# Patient Record
Sex: Female | Born: 1980 | Race: Black or African American | Hispanic: No | Marital: Married | Smoking: Never smoker
Health system: Southern US, Community
[De-identification: ages and names within clinical notes are randomized; demographics above are authoritative.]

## PROBLEM LIST (undated history)

## (undated) ENCOUNTER — Inpatient Hospital Stay (HOSPITAL_COMMUNITY): Payer: Self-pay

## (undated) DIAGNOSIS — Z87440 Personal history of urinary (tract) infections: Secondary | ICD-10-CM

## (undated) DIAGNOSIS — Z8619 Personal history of other infectious and parasitic diseases: Secondary | ICD-10-CM

## (undated) DIAGNOSIS — B373 Candidiasis of vulva and vagina: Secondary | ICD-10-CM

## (undated) DIAGNOSIS — Z87898 Personal history of other specified conditions: Secondary | ICD-10-CM

## (undated) DIAGNOSIS — O402XX0 Polyhydramnios, second trimester, not applicable or unspecified: Secondary | ICD-10-CM

## (undated) DIAGNOSIS — F329 Major depressive disorder, single episode, unspecified: Secondary | ICD-10-CM

## (undated) DIAGNOSIS — E039 Hypothyroidism, unspecified: Secondary | ICD-10-CM

## (undated) DIAGNOSIS — D509 Iron deficiency anemia, unspecified: Secondary | ICD-10-CM

## (undated) DIAGNOSIS — G43909 Migraine, unspecified, not intractable, without status migrainosus: Secondary | ICD-10-CM

## (undated) DIAGNOSIS — B379 Candidiasis, unspecified: Secondary | ICD-10-CM

## (undated) DIAGNOSIS — IMO0002 Reserved for concepts with insufficient information to code with codable children: Secondary | ICD-10-CM

## (undated) HISTORY — DX: Personal history of other specified conditions: Z87.898

## (undated) HISTORY — DX: Major depressive disorder, single episode, unspecified: F32.9

## (undated) HISTORY — DX: Candidiasis of vulva and vagina: B37.3

## (undated) HISTORY — PX: WISDOM TOOTH EXTRACTION: SHX21

## (undated) HISTORY — DX: Personal history of urinary (tract) infections: Z87.440

## (undated) HISTORY — DX: Personal history of other infectious and parasitic diseases: Z86.19

## (undated) HISTORY — DX: Polyhydramnios, second trimester, not applicable or unspecified: O40.2XX0

## (undated) HISTORY — DX: Reserved for concepts with insufficient information to code with codable children: IMO0002

## (undated) HISTORY — DX: Candidiasis, unspecified: B37.9

---
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
## 2000-04-17 DIAGNOSIS — IMO0002 Reserved for concepts with insufficient information to code with codable children: Secondary | ICD-10-CM

## 2000-04-17 DIAGNOSIS — R87619 Unspecified abnormal cytological findings in specimens from cervix uteri: Secondary | ICD-10-CM

## 2000-04-17 HISTORY — DX: Reserved for concepts with insufficient information to code with codable children: IMO0002

## 2000-04-17 HISTORY — DX: Unspecified abnormal cytological findings in specimens from cervix uteri: R87.619

## 2001-04-24 ENCOUNTER — Other Ambulatory Visit: Admission: RE | Admit: 2001-04-24 | Discharge: 2001-04-24 | Payer: Self-pay | Admitting: Obstetrics & Gynecology

## 2001-12-24 ENCOUNTER — Other Ambulatory Visit: Admission: RE | Admit: 2001-12-24 | Discharge: 2001-12-24 | Payer: Self-pay | Admitting: Obstetrics and Gynecology

## 2002-05-14 ENCOUNTER — Other Ambulatory Visit: Admission: RE | Admit: 2002-05-14 | Discharge: 2002-05-14 | Payer: Self-pay | Admitting: Obstetrics and Gynecology

## 2002-07-22 ENCOUNTER — Encounter: Payer: Self-pay | Admitting: Emergency Medicine

## 2002-07-22 ENCOUNTER — Emergency Department (HOSPITAL_COMMUNITY): Admission: EM | Admit: 2002-07-22 | Discharge: 2002-07-22 | Payer: Self-pay | Admitting: Emergency Medicine

## 2002-09-22 DIAGNOSIS — Z87898 Personal history of other specified conditions: Secondary | ICD-10-CM

## 2002-09-22 HISTORY — DX: Personal history of other specified conditions: Z87.898

## 2003-02-04 ENCOUNTER — Emergency Department (HOSPITAL_COMMUNITY): Admission: EM | Admit: 2003-02-04 | Discharge: 2003-02-04 | Payer: Self-pay | Admitting: Emergency Medicine

## 2003-05-28 ENCOUNTER — Other Ambulatory Visit: Admission: RE | Admit: 2003-05-28 | Discharge: 2003-05-28 | Payer: Self-pay | Admitting: Obstetrics and Gynecology

## 2003-06-12 DIAGNOSIS — B373 Candidiasis of vulva and vagina: Secondary | ICD-10-CM

## 2003-06-12 DIAGNOSIS — B3731 Acute candidiasis of vulva and vagina: Secondary | ICD-10-CM

## 2003-06-12 HISTORY — DX: Candidiasis of vulva and vagina: B37.3

## 2003-06-12 HISTORY — DX: Acute candidiasis of vulva and vagina: B37.31

## 2003-07-14 ENCOUNTER — Ambulatory Visit (HOSPITAL_COMMUNITY): Admission: RE | Admit: 2003-07-14 | Discharge: 2003-07-14 | Payer: Self-pay | Admitting: Family Medicine

## 2004-02-28 ENCOUNTER — Emergency Department (HOSPITAL_COMMUNITY): Admission: EM | Admit: 2004-02-28 | Discharge: 2004-02-28 | Payer: Self-pay | Admitting: Emergency Medicine

## 2004-12-15 ENCOUNTER — Ambulatory Visit (HOSPITAL_COMMUNITY): Admission: RE | Admit: 2004-12-15 | Discharge: 2004-12-15 | Payer: Self-pay | Admitting: Obstetrics and Gynecology

## 2005-02-06 ENCOUNTER — Ambulatory Visit (HOSPITAL_COMMUNITY): Admission: RE | Admit: 2005-02-06 | Discharge: 2005-02-06 | Payer: Self-pay | Admitting: Obstetrics and Gynecology

## 2005-04-07 ENCOUNTER — Inpatient Hospital Stay (HOSPITAL_COMMUNITY): Admission: AD | Admit: 2005-04-07 | Discharge: 2005-04-07 | Payer: Self-pay | Admitting: Obstetrics and Gynecology

## 2005-04-23 ENCOUNTER — Inpatient Hospital Stay (HOSPITAL_COMMUNITY): Admission: AD | Admit: 2005-04-23 | Discharge: 2005-04-26 | Payer: Self-pay | Admitting: Obstetrics and Gynecology

## 2005-04-27 ENCOUNTER — Encounter: Admission: RE | Admit: 2005-04-27 | Discharge: 2005-05-27 | Payer: Self-pay | Admitting: Obstetrics and Gynecology

## 2005-05-28 ENCOUNTER — Encounter: Admission: RE | Admit: 2005-05-28 | Discharge: 2005-06-07 | Payer: Self-pay | Admitting: Obstetrics and Gynecology

## 2005-09-08 ENCOUNTER — Other Ambulatory Visit: Admission: RE | Admit: 2005-09-08 | Discharge: 2005-09-08 | Payer: Self-pay | Admitting: Obstetrics and Gynecology

## 2006-04-17 DIAGNOSIS — F32A Depression, unspecified: Secondary | ICD-10-CM

## 2006-04-17 HISTORY — DX: Depression, unspecified: F32.A

## 2007-09-10 ENCOUNTER — Emergency Department (HOSPITAL_COMMUNITY): Admission: EM | Admit: 2007-09-10 | Discharge: 2007-09-10 | Payer: Self-pay | Admitting: Family Medicine

## 2009-04-17 DIAGNOSIS — Z87440 Personal history of urinary (tract) infections: Secondary | ICD-10-CM

## 2009-04-17 HISTORY — DX: Personal history of urinary (tract) infections: Z87.440

## 2010-09-02 NOTE — H&P (Signed)
Danielle Maxwell              ACCOUNT NO.:  [id number]   MEDICAL RECORD NO.:  [id number]          PATIENT TYPE:  INP   LOCATION:  9160                          FACILITY:  [facility]   PHYSICIAN:  Danielle Maxwell, M.D. DATE OF BIRTH:  [birth year]   DATE OF ADMISSION:  04/23/2005  DATE OF DISCHARGE:                                HISTORY & PHYSICAL   [name] is a 30 year old gravida 2, para 0-0-1-0, who presents at [redacted]  weeks gestation, EDD April 30, 2005, as determined by ultrasound at 6  weeks 4 days.  She began having contractions through the night that have  been increasing in frequency and intensity.  She reports positive fetal  movement, no vaginal bleeding, no rupture of membranes.  Her contractions  are now every 3-5 minutes and she is noted to be 5 cm dilated.  She denies  any headache, visual changes, or epigastric pain.  Her pregnancy has been  followed by the C.N.M. service at [facility] and is remarkable for:  (1) Transfer  of care from [facility] at 18 weeks.  (2) ? LMP.  (3) First trimester  bleeding.  (4) Father of the baby with congenital heart defect.  (5) Group B  Strep positive.  This patient began prenatal care early in the first  trimester at [facility] and transferred care to [facility] at 18 weeks.  Her  prenatal course has been essentially unremarkable.  Ultrasound examination  of the fetus initially showed EIF and renal pyelectasis.  Follow-up  ultrasound studies were benign.  Growth within normal limits.  Fluid within  normal limits.  The patient has been normotensive throughout her pregnancy  with no proteinuria.  She had some right-sided abdominal pain at  approximately [redacted] weeks gestation.  Evaluation was found to be within normal  limits.  Comprehensive metabolic panel, amylase, and lipase were checked and  these were all within normal limits.  Pain did dissipate and did not return.  At 27 weeks, the patient was checked for preterm labor.  Her fetal  fibronectin at that time was negative, cervix long and closed, and she did  not have any further complications.   PRENATAL LABORATORY DATA:  On October 21, 2004; hemoglobin and hematocrit 13.4  and 39.4, platelets 264,000.  Blood type and Rh O positive, antibody screen  negative, sickle cell trait negative, VDRL nonreactive, rubella immune,  hepatitis B surface antigen negative, HIV nonreactive, Pap smear within  normal limits, GC and Chlamydia negative.  At 28 weeks, 1-hour Glucose  Challenge 97 and at 36 weeks culture of the vaginal tract is positive for  Group B Strep.   PAST OBSTETRICAL HISTORY:  In 2002 the patient had a first trimester  elective abortion with no complications.  This is her second and current  pregnancy.   PAST MEDICAL HISTORY:  Unremarkable.   ALLERGIES:  No known drug allergies.   SOCIAL HISTORY:  She denies the use of tobacco, alcohol, or illicit drugs.  She began her pregnancy at 114 pounds, last prenatal weight was 145 pounds  for a 31 pound weight gain.  FAMILY HISTORY:  Maternal grandmother and maternal grandfather with a  history of chronic hypertension on medications.  Paternal grandmother has  varicose veins.  The patient's father, maternal grandfather, and paternal  grandmother with a history of diabetes.  Maternal grandfather prostate  cancer.  Paternal grandmother breast cancer.  Maternal aunt had drug  addiction.   GENETIC HISTORY:  Father of the baby had heart valve replacement shortly  after birth.   SOCIAL HISTORY:  [name] is a married 30 year old African-American  female.  Her husband, Danielle Maxwell, is involved and supportive.  They  are [place] in their faith.  The patient's husband works as a Naval architect  and she works as a Diplomatic Services operational officer.   REVIEW OF SYSTEMS:  As described above.  The patient is typical of one with  a uterine pregnancy at term in early active labor.   PHYSICAL EXAMINATION:  VITAL SIGNS:  Stable.  The  patient is afebrile.  HEENT:  Unremarkable.  HEART:  Regular rate and rhythm.  LUNGS:  Clear.  ABDOMEN:  Gravid in its contour.  Uterine fundus is noted to extend 40 cm  above the level of the pubic symphysis.  Leopold's maneuver finds the infant  to be in the longitudinal lie, cephalic presentation, and the estimated  fetal weight is 8 pounds.  The baseline of the fetal heart rate monitor is  140s with average longterm variability reactivity is present with no  periodic changes.  The patient is contracting every 3-5 minutes.  PELVIC:  Digital examination of the cervix finds it to be 5 cm dilated, 90%  effaced, with cephalic presenting part at a -1 station and membranes intact.  EXTREMITIES:  No pathologic edema.  DTRs are 1+ with no clonus.  There is  negative calf tenderness bilaterally.   ASSESSMENT:  1.  Intrauterine pregnancy at 39 weeks.  2.  Early active labor.   PLAN:  Admit per Danielle Maxwell, M.D.  Routine C.N.M. orders.  Start  penicillin-G prophylaxis for positive Group B Strep.  May have epidural  p.r.n.      Danielle Maxwell, C.N.M.      Danielle Maxwell, M.D.  Electronically Signed    SDM/MEDQ  D:  04/23/2005  T:  04/24/2005  Job:  [id number]

## 2011-01-11 LAB — POCT PREGNANCY, URINE
Operator id: 282151
Preg Test, Ur: NEGATIVE

## 2011-01-11 LAB — POCT URINALYSIS DIP (DEVICE)
Glucose, UA: NEGATIVE
Nitrite: NEGATIVE
Operator id: 282151
Protein, ur: 100 — AB
Specific Gravity, Urine: 1.02
Urobilinogen, UA: 0.2
pH: 6.5

## 2011-01-11 LAB — URINE CULTURE
Colony Count: NO GROWTH
Culture: NO GROWTH

## 2011-04-18 NOTE — L&D Delivery Note (Signed)
Delivery Note Pushing started at 2158 with pt feeling urge.  Pushed well to SVD at 10:27 PM a viable female "Danielle Maxwell" was delivered via Vaginal, Spontaneous Delivery (Presentation: Right Occiput Anterior).  APGAR: 9, 9; weight: not done at time of this note.  Slight delay of shoulders, and body, so HOB down and McRoberts, but pt w/ significantly more pain as head crowning and had difficulty relaxing pelvis and legs and started creeping up bed, so her position hindered ease of delivery as well. Placenta status: Intact, Spontaneous, Schultz.  Cord: 3 vessels with the following complications: None.  Cord pH: not collected  Anesthesia: Epidural Local: Lidocaine 1% Episiotomy: None Lacerations: shallow/partial 2nd degree Suture Repair: 3-0 monocryl Est. Blood Loss (mL): 250  Mom to postpartum.  Baby to nursery-stable. Plans to breastfeed.    [name] H 12/31/2011, 11:28 PM

## 2011-06-15 ENCOUNTER — Encounter (INDEPENDENT_AMBULATORY_CARE_PROVIDER_SITE_OTHER): Payer: BC Managed Care – PPO

## 2011-06-15 DIAGNOSIS — Z331 Pregnant state, incidental: Secondary | ICD-10-CM

## 2011-06-15 LAB — OB RESULTS CONSOLE ABO/RH: RH Type: NEGATIVE

## 2011-06-15 LAB — OB RESULTS CONSOLE HGB/HCT, BLOOD
HCT: 15 %
Hemoglobin: 13.6 g/dL

## 2011-06-15 LAB — OB RESULTS CONSOLE HEPATITIS B SURFACE ANTIGEN: Hepatitis B Surface Ag: NEGATIVE

## 2011-06-15 LAB — OB RESULTS CONSOLE RPR: RPR: NONREACTIVE

## 2011-06-15 LAB — OB RESULTS CONSOLE RUBELLA ANTIBODY, IGM: Rubella: IMMUNE

## 2011-06-15 LAB — OB RESULTS CONSOLE HIV ANTIBODY (ROUTINE TESTING): HIV: NONREACTIVE

## 2011-06-15 LAB — OB RESULTS CONSOLE PLATELET COUNT: Platelets: 296 10*3/uL

## 2011-06-27 ENCOUNTER — Encounter (INDEPENDENT_AMBULATORY_CARE_PROVIDER_SITE_OTHER): Payer: BC Managed Care – PPO | Admitting: Registered Nurse

## 2011-06-27 DIAGNOSIS — Z331 Pregnant state, incidental: Secondary | ICD-10-CM

## 2011-06-27 LAB — OB RESULTS CONSOLE GC/CHLAMYDIA
Chlamydia: NEGATIVE
Chlamydia: NEGATIVE
Gonorrhea: NEGATIVE
Gonorrhea: NEGATIVE

## 2011-07-19 DIAGNOSIS — A491 Streptococcal infection, unspecified site: Secondary | ICD-10-CM | POA: Insufficient documentation

## 2011-08-01 ENCOUNTER — Encounter: Payer: Self-pay | Admitting: Obstetrics and Gynecology

## 2011-08-01 ENCOUNTER — Encounter (HOSPITAL_COMMUNITY): Payer: Self-pay

## 2011-08-01 ENCOUNTER — Inpatient Hospital Stay (HOSPITAL_COMMUNITY)
Admission: AD | Admit: 2011-08-01 | Discharge: 2011-08-01 | Disposition: A | Discharge status: Home / Self Care | Payer: BC Managed Care – PPO | Source: Ambulatory Visit | Attending: Obstetrics and Gynecology | Admitting: Obstetrics and Gynecology

## 2011-08-01 ENCOUNTER — Ambulatory Visit (INDEPENDENT_AMBULATORY_CARE_PROVIDER_SITE_OTHER): Payer: BC Managed Care – PPO | Admitting: Obstetrics and Gynecology

## 2011-08-01 VITALS — BP 104/60 | Ht 63.0 in | Wt 128.0 lb

## 2011-08-01 DIAGNOSIS — Z331 Pregnant state, incidental: Secondary | ICD-10-CM

## 2011-08-01 DIAGNOSIS — R002 Palpitations: Secondary | ICD-10-CM | POA: Insufficient documentation

## 2011-08-01 DIAGNOSIS — R0602 Shortness of breath: Secondary | ICD-10-CM | POA: Insufficient documentation

## 2011-08-01 DIAGNOSIS — O99891 Other specified diseases and conditions complicating pregnancy: Secondary | ICD-10-CM | POA: Insufficient documentation

## 2011-08-01 NOTE — Progress Notes (Signed)
Pt c/o of SOB while talking and walking.  She does not exercise.  No CP.  occ heart palpitations CV; RRR Lungs: CTAB ABD; soft , gravid NT EXT no CCEB.  Neg Homans sign B SOB with pregnancy.  To MAU for pulse ox and eval.  May need cardiology consultation  Quad screen today per pt request Anatomy US @[facility]  per pt request

## 2011-08-01 NOTE — Progress Notes (Signed)
  History   S: short of breath x 3 just walking or talking at work to the point of needing to sit down, felt heart fluttering a few times while resting in bed. Not sure maybe feeling baby move. Drinks 3 cups coffee weekly. No cardiac problems in family  CSN: [id number]  Arrival date and time: 08/01/11 1110   First Provider Initiated Contact with Patient 08/01/11 1140      Chief Complaint  Patient presents with  . Shortness of Breath   HPI  OB History    Grav Para Term Preterm Abortions TAB SAB Ect Mult Living   3 1 1  1  0    1      Past Medical History  Diagnosis Date  . Monilial vaginitis 06/12/2003  . H/O urinary frequency 09/22/2002  . H/O chlamydia infection   . History of varicella   . H/O rubella   . Yeast infection   . Abnormal Pap smear 2002  . Depression 2008  . Hx: UTI (urinary tract infection) 04/2009    Past Surgical History  Procedure Date  . Wisdom tooth extraction     Family History  Problem Relation Age of Onset  . Diabetes Father   . Hypertension Maternal Aunt   . Alcohol abuse Paternal Aunt   . Hypertension Maternal Grandmother   . Hypertension Maternal Grandfather   . Diabetes Maternal Grandfather   . Cancer Maternal Grandfather   . Diabetes Paternal Grandmother   . Anesthesia problems Neg Hx     History  Substance Use Topics  . Smoking status: Never Smoker   . Smokeless tobacco: Not on file  . Alcohol Use: No    Allergies: No Known Allergies  Prescriptions prior to admission  Medication Sig Dispense Refill  . Prenatal Vit-Fe Fumarate-FA (PRENATAL MULTIVITAMIN) TABS Take 1 tablet by mouth daily.        ROS Physical Exam  Calm, no distress, lungs clear bilaterally, AP RRR, FHTS + O2 sat 100% Blood pressure 109/64, pulse 87, temperature 97 F (36.1 C), temperature source Oral, resp. rate 16, last menstrual period 03/20/2011, SpO2 100.00%.  Physical Exam  MAU Course  Procedures   Assessment and Plan  17 5/7 week  pregnancy Short of breath and palpitations Addendum: Plan cardiac referral, discussed 8 water daily, meals and snacks, change position slowly, sit or lay down if not feeling well, report if sx change or worsen, office will schedule cardiac referral, collaboration with Dr. Stefano Gaul.  Benedict Kue 08/01/2011, 11:49 AM

## 2011-08-01 NOTE — Discharge Instructions (Signed)
abcShortness of Breath   Your condition does not improve in the time expected.   You have a hard time doing your normal activities even with rest.   You have any side effects or problems with the medicines prescribed.   You develop any new symptoms.  SEEK IMMEDIATE MEDICAL CARE IF:   Your shortness of breath is getting worse.   You feel lightheaded, faint, or develop a cough not controlled with medicines.   You start coughing up blood.   You have pain with breathing.   You have chest pain or pain in your arms, shoulders, or abdomen.   You have a fever.   You are unable to walk up stairs or exercise the way you normally do.   Your symptoms are getting worse.  Document Released: 12/27/2000 Document Revised: 03/23/2011 Document Reviewed: 08/14/2007 [facility] Patient Information 2012 [place], [place].ABCs of Pregnancy A Antepartum care is very important. Be sure you see your doctor and get prenatal care as soon as you think you are pregnant. At this time, you will be tested for infection, genetic abnormalities and potential problems with you and the pregnancy. This is the time to discuss diet, exercise, work, medications, labor, pain medication during labor and the possibility of a cesarean delivery. Ask any questions that may concern you. It is important to see your doctor regularly throughout your pregnancy. Avoid exposure to toxic substances and chemicals - such as cleaning solvents, lead and mercury, some insecticides, and paint. Pregnant women should avoid exposure to paint fumes, and fumes that cause you to feel ill, dizzy or faint. When possible, it is a good idea to have a pre-pregnancy consultation with your caregiver to begin some important recommendations your caregiver suggests such as, taking folic acid, exercising, quitting smoking, avoiding alcoholic beverages, etc. B Breastfeeding is the healthiest choice for both you and your baby. It has many nutritional benefits for the  baby and health benefits for the mother. It also creates a very tight and loving bond between the baby and mother. Talk to your doctor, your family and friends, and your employer about how you choose to feed your baby and how they can support you in your decision. Not all birth defects can be prevented, but a woman can take actions that may increase her chance of having a healthy baby. Many birth defects happen very early in pregnancy, sometimes before a woman even knows she is pregnant. Birth defects or abnormalities of any child in your or the father's family should be discussed with your caregiver. Get a good support bra as your breast size changes. Wear it especially when you exercise and when nursing.  C Celebrate the news of your pregnancy with the your spouse/father and family. Childbirth classes are helpful to take for you and the spouse/father because it helps to understand what happens during the pregnancy, labor and delivery. Cesarean delivery should be discussed with your doctor so you are prepared for that possibility. The pros and cons of circumcision if it is a boy, should be discussed with your pediatrician. Cigarette smoking during pregnancy can result in low birth weight babies. It has been associated with infertility, miscarriages, tubal pregnancies, infant death (mortality) and poor health (morbidity) in childhood. Additionally, cigarette smoking may cause long-term learning disabilities. If you smoke, you should try to quit before getting pregnant and not smoke during the pregnancy. Secondary smoke may also harm a mother and her developing baby. It is a good idea to ask people to stop  smoking around you during your pregnancy and after the baby is born. Extra calcium is necessary when you are pregnant and is found in your prenatal vitamin, in dairy products, green leafy vegetables and in calcium supplements. D A healthy diet according to your current weight and height, along with vitamins and  mineral supplements should be discussed with your caregiver. Domestic abuse or violence should be made known to your doctor right away to get the situation corrected. Drink more water when you exercise to keep hydrated. Discomfort of your back and legs usually develops and progresses from the middle of the second trimester through to delivery of the baby. This is because of the enlarging baby and uterus, which may also affect your balance. Do not take illegal drugs. Illegal drugs can seriously harm the baby and you. Drink extra fluids (water is best) throughout pregnancy to help your body keep up with the increases in your blood volume. Drink at least 6 to 8 glasses of water, fruit juice, or milk each day. A good way to know you are drinking enough fluid is when your urine looks almost like clear water or is very light yellow.  E Eat healthy to get the nutrients you and your unborn baby need. Your meals should include the five basic food groups. Exercise (30 minutes of light to moderate exercise a day) is important and encouraged during pregnancy, if there are no medical problems or problems with the pregnancy. Exercise that causes discomfort or dizziness should be stopped and reported to your caregiver. Emotions during pregnancy can change from being ecstatic to depression and should be understood by you, your partner and your family. F Fetal screening with ultrasound, amniocentesis and monitoring during pregnancy and labor is common and sometimes necessary. Take 400 micrograms of folic acid daily both before, when possible, and during the first few months of pregnancy to reduce the risk of birth defects of the brain and spine. All women who could possibly become pregnant should take a vitamin with folic acid, every day. It is also important to eat a healthy diet with fortified foods (enriched grain products, including cereals, rice, breads, and pastas) and foods with natural sources of folate (orange juice,  green leafy vegetables, beans, peanuts, broccoli, asparagus, peas, and lentils). The father should be involved with all aspects of the pregnancy including, the prenatal care, childbirth classes, labor, delivery, and postpartum time. Fathers may also have emotional concerns about being a father, financial needs, and raising a family. G Genetic testing should be done appropriately. It is important to know your family and the father's history. If there have been problems with pregnancies or birth defects in your family, report these to your doctor. Also, genetic counselors can talk with you about the information you might need in making decisions about having a family. You can call a major medical center in your area for help in finding a board-certified genetic counselor. Genetic testing and counseling should be done before pregnancy when possible, especially if there is a history of problems in the mother's or father's family. Certain ethnic backgrounds are more at risk for genetic defects. H Get familiar with the hospital where you will be having your baby. Get to know how long it takes to get there, the labor and delivery area, and the hospital procedures. Be sure your medical insurance is accepted there. Get your home ready for the baby including, clothes, the baby's room (when possible), furniture and car seat. Hand washing is important throughout  the day, especially after handling raw meat and poultry, changing the baby's diaper or using the bathroom. This can help prevent the spread of many bacteria and viruses that cause infection. Your hair may become dry and thinner, but will return to normal a few weeks after the baby is born. Heartburn is a common problem that can be treated by taking antacids recommended by your caregiver, eating smaller meals 5 or 6 times a day, not drinking liquids when eating, drinking between meals and raising the head of your bed 2 to 3 inches. I Insurance to cover you, the  baby, doctor and hospital should be reviewed so that you will be prepared to pay any costs not covered by your insurance plan. If you do not have medical insurance, there are usually clinics and services available for you in your community. Take 30 milligrams of iron during your pregnancy as prescribed by your doctor to reduce the risk of low red blood cells (anemia) later in pregnancy. All women of childbearing age should eat a diet rich in iron. J There should be a joint effort for the mother, father and any other children to adapt to the pregnancy financially, emotionally, and psychologically during the pregnancy. Join a support group for moms-to-be. Or, join a class on parenting or childbirth. Have the family participate when possible. K Know your limits. Let your caregiver know if you experience any of the following:   Pain of any kind.   Strong cramps.   You develop a lot of weight in a short period of time (5 pounds in 3 to 5 days).   Vaginal bleeding, leaking of amniotic fluid.   Headache, vision problems.   Dizziness, fainting, shortness of breath.   Chest pain.   Fever of 102 F (38.9 C) or higher.   Gush of clear fluid from your vagina.   Painful urination.   Domestic violence.   Irregular heartbeat (palpitations).   Rapid beating of the heart (tachycardia).   Constant feeling sick to your stomach (nauseous) and vomiting.   Trouble walking, fluid retention (edema).   Muscle weakness.   If your baby has decreased activity.   Persistent diarrhea.   Abnormal vaginal discharge.   Uterine contractions at 20-minute intervals.   Back pain that travels down your leg.  L Learn and practice that what you eat and drink should be in moderation and healthy for you and your baby. Legal drugs such as alcohol and caffeine are important issues for pregnant women. There is no safe amount of alcohol a woman can drink while pregnant. Fetal alcohol syndrome, a disorder  characterized by growth retardation, facial abnormalities, and central nervous system dysfunction, is caused by a woman's use of alcohol during pregnancy. Caffeine, found in tea, coffee, soft drinks and chocolate, should also be limited. Be sure to read labels when trying to cut down on caffeine during pregnancy. More than 200 foods, beverages, and over-the-counter medications contain caffeine and have a high salt content! There are coffees and teas that do not contain caffeine. M Medical conditions such as diabetes, epilepsy, and high blood pressure should be treated and kept under control before pregnancy when possible, but especially during pregnancy. Ask your caregiver about any medications that may need to be changed or adjusted during pregnancy. If you are currently taking any medications, ask your caregiver if it is safe to take them while you are pregnant or before getting pregnant when possible. Also, be sure to discuss any herbs or vitamins  you are taking. They are medicines, too! Discuss with your doctor all medications, prescribed and over-the-counter, that you are taking. During your prenatal visit, discuss the medications your doctor may give you during labor and delivery. N Never be afraid to ask your doctor or caregiver questions about your health, the progress of the pregnancy, family problems, stressful situations, and recommendation for a pediatrician, if you do not have one. It is better to take all precautions and discuss any questions or concerns you may have during your office visits. It is a good idea to write down your questions before you visit the doctor. O Over-the-counter cough and cold remedies may contain alcohol or other ingredients that should be avoided during pregnancy. Ask your caregiver about prescription, herbs or over-the-counter medications that you are taking or may consider taking while pregnant.  P Physical activity during pregnancy can benefit both you and your  baby by lessening discomfort and fatigue, providing a sense of well-being, and increasing the likelihood of early recovery after delivery. Light to moderate exercise during pregnancy strengthens the belly (abdominal) and back muscles. This helps improve posture. Practicing yoga, walking, swimming, and cycling on a stationary bicycle are usually safe exercises for pregnant women. Avoid scuba diving, exercise at high altitudes (over 3000 feet), skiing, horseback riding, contact sports, etc. Always check with your doctor before beginning any kind of exercise, especially during pregnancy and especially if you did not exercise before getting pregnant. Q Queasiness, stomach upset and morning sickness are common during pregnancy. Eating a couple of crackers or dry toast before getting out of bed. Foods that you normally love may make you feel sick to your stomach. You may need to substitute other nutritious foods. Eating 5 or 6 small meals a day instead of 3 large ones may make you feel better. Do not drink with your meals, drink between meals. Questions that you have should be written down and asked during your prenatal visits. R Read about and make plans to baby-proof your home. There are important tips for making your home a safer environment for your baby. Review the tips and make your home safer for you and your baby. Read food labels regarding calories, salt and fat content in the food. S Saunas, hot tubs, and steam rooms should be avoided while you are pregnant. Excessive high heat may be harmful during your pregnancy. Your caregiver will screen and examine you for sexually transmitted diseases and genetic disorders during your prenatal visits. Learn the signs of labor. Sexual relations while pregnant is safe unless there is a medical or pregnancy problem and your caregiver advises against it. T Traveling long distances should be avoided especially in the third trimester of your pregnancy. If you do have to  travel out of state, be sure to take a copy of your medical records and medical insurance plan with you. You should not travel long distances without seeing your doctor first. Most airlines will not allow you to travel after 36 weeks of pregnancy. Toxoplasmosis is an infection caused by a parasite that can seriously harm an unborn baby. Avoid eating undercooked meat and handling cat litter. Be sure to wear gloves when gardening. Tingling of the hands and fingers is not unusual and is due to fluid retention. This will go away after the baby is born. U Womb (uterus) size increases during the first trimester. Your kidneys will begin to function more efficiently. This may cause you to feel the need to urinate more often. You may  also leak urine when sneezing, coughing or laughing. This is due to the growing uterus pressing against your bladder, which lies directly in front of and slightly under the uterus during the first few months of pregnancy. If you experience burning along with frequency of urination or bloody urine, be sure to tell your doctor. The size of your uterus in the third trimester may cause a problem with your balance. It is advisable to maintain good posture and avoid wearing high heels during this time. An ultrasound of your baby may be necessary during your pregnancy and is safe for you and your baby. V Vaccinations are an important concern for pregnant women. Get needed vaccines before pregnancy. Center for Disease Control ([employer]) has clear guidelines for the use of vaccines during pregnancy. Review the list, be sure to discuss it with your doctor. Prenatal vitamins are helpful and healthy for you and the baby. Do not take extra vitamins except what is recommended. Taking too much of certain vitamins can cause overdose problems. Continuous vomiting should be reported to your caregiver. Varicose veins may appear especially if there is a family history of varicose veins. They should subside  after the delivery of the baby. Support hose helps if there is leg discomfort. W Being overweight or underweight during pregnancy may cause problems. Try to get within 15 pounds of your ideal weight before pregnancy. Remember, pregnancy is not a time to be dieting! Do not stop eating or start skipping meals as your weight increases. Both you and your baby need the calories and nutrition you receive from a healthy diet. Be sure to consult with your doctor about your diet. There is a formula and diet plan available depending on whether you are overweight or underweight. Your caregiver or nutritionist can help and advise you if necessary. X Avoid X-rays. If you must have dental work or diagnostic tests, tell your dentist or physician that you are pregnant so that extra care can be taken. X-rays should only be taken when the risks of not taking them outweigh the risk of taking them. If needed, only the minimum amount of radiation should be used. When X-rays are necessary, protective lead shields should be used to cover areas of the body that are not being X-rayed. Y Your baby loves you. Breastfeeding your baby creates a loving and very close bond between the two of you. Give your baby a healthy environment to live in while you are pregnant. Infants and children require constant care and guidance. Their health and safety should be carefully watched at all times. After the baby is born, rest or take a nap when the baby is sleeping. Z Get your ZZZs. Be sure to get plenty of rest. Resting on your side as often as possible, especially on your left side is advised. It provides the best circulation to your baby and helps reduce swelling. Try taking a nap for 30 to 45 minutes in the afternoon when possible. After the baby is born rest or take a nap when the baby is sleeping. Try elevating your feet for that amount of time when possible. It helps the circulation in your legs and helps reduce swelling.  Most information  courtesy of the CDC. Document Released: 04/03/2005 Document Revised: 03/23/2011 Document Reviewed: 12/16/2008 [facility] Patient Information 2012 [place], [place].

## 2011-08-01 NOTE — Progress Notes (Signed)
Pt c/o shortness of breath while doing regular activities , and it takes a while for her to catch her breath.

## 2011-08-01 NOTE — [facility] Note (Signed)
Pt states 2 weeks ago had onset of shortness of breath, denies hx asthma, no bleeding or abnormal vaginal d/c noted. Has had 3 instances since 2 weeks ago. Once when she was going to work, once getting off work, third episode is unable to recall what she was doing. Has also felt intermittent heart flutters, however pt states this does not happen together.

## 2011-08-02 ENCOUNTER — Other Ambulatory Visit: Payer: Self-pay | Admitting: Obstetrics and Gynecology

## 2011-08-02 ENCOUNTER — Telehealth: Payer: Self-pay | Admitting: Obstetrics and Gynecology

## 2011-08-02 DIAGNOSIS — R002 Palpitations: Secondary | ICD-10-CM

## 2011-08-02 LAB — AFP, QUAD SCREEN
AFP: 37.7 IU/mL
Age Alone: 1:627 {titer}
Curr Gest Age: 17.5 wks.days
Down Syndrome Scr Risk Est: 1:4670 {titer}
HCG, Total: 23471 m[IU]/mL
INH: 175 pg/mL
Interpretation-AFP: NEGATIVE
MoM for AFP: 0.86
MoM for INH: 0.93
Trisomy 18 (Edward) Syndrome Interp.: 1:47300 {titer}
uE3 Mom: 1.17
uE3 Value: 1 ng/mL

## 2011-08-02 NOTE — Telephone Encounter (Signed)
TC from [facility].  Pt was seen at [facility] for shortness of breath and palpitations.   Needs referral to outpatient cardiac,

## 2011-08-02 NOTE — Telephone Encounter (Signed)
[facility] notified.  Sched app w/ Dr Elease Hashimoto 07/17/11 at 11:00.  TC to pt.  Informed of appt.   Pt. Agreeable.

## 2011-08-04 ENCOUNTER — Ambulatory Visit (INDEPENDENT_AMBULATORY_CARE_PROVIDER_SITE_OTHER): Payer: BC Managed Care – PPO | Admitting: Cardiovascular Disease

## 2011-08-04 ENCOUNTER — Encounter: Payer: Self-pay | Admitting: Cardiovascular Disease

## 2011-08-04 VITALS — BP 110/64 | HR 89 | Resp 20 | Ht 63.0 in | Wt 129.8 lb

## 2011-08-04 DIAGNOSIS — R0600 Dyspnea, unspecified: Secondary | ICD-10-CM

## 2011-08-04 DIAGNOSIS — R0602 Shortness of breath: Secondary | ICD-10-CM

## 2011-08-04 DIAGNOSIS — R0989 Other specified symptoms and signs involving the circulatory and respiratory systems: Secondary | ICD-10-CM

## 2011-08-04 DIAGNOSIS — R0609 Other forms of dyspnea: Secondary | ICD-10-CM

## 2011-08-04 NOTE — Progress Notes (Signed)
    Danielle Maxwell Date of Birth  December 31, 1980 [facility]     [place] Office  [address]    [address]   [address] [place], [place]  [address]    [place], [place]  [address] [phone]  Fax  [phone]  [phone]  Fax [phone]  Problem List: 1. Dyspnea 2. Tachycardia 3. Pregnancy   History of Present Illness:  Danielle Maxwell is a 31 year old female who presents today for tachycardia and dyspnea. She is currently [redacted] weeks pregnant.  She has not had any other palpitations with her pregnancy. She is able to do all of her normal activities. She occasionally notices that her heart rate has been off aspirin she also has some shortness breath when she tries to do various activities.  She continues to work. 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 She denies any chest pain, shortness breath, syncope, leg edema, PND, orthopnea. Her pregnancy is progressing normally.  Current Outpatient Prescriptions on File Prior to Visit  Medication Sig Dispense Refill  . Prenatal Vit-Fe Fumarate-FA (PRENATAL MULTIVITAMIN) TABS Take 1 tablet by mouth daily.        No Known Allergies  Past Medical History  Diagnosis Date  . Monilial vaginitis 06/12/2003  . H/O urinary frequency 09/22/2002  . H/O chlamydia infection   . History of varicella   . H/O rubella   . Yeast infection   . Abnormal Pap smear 2002  . Depression 2008  . Hx: UTI (urinary tract infection) 04/2009    Past Surgical History  Procedure Date  . Wisdom tooth extraction     History  Smoking status  . Never Smoker   Smokeless tobacco  . Not on file    History  Alcohol Use No    Family History  Problem Relation Age of Onset  . Diabetes Father   . Hypertension Maternal Aunt   . Alcohol abuse Paternal Aunt   . Hypertension Maternal Grandmother   . Hypertension Maternal Grandfather   . Diabetes Maternal Grandfather   . Cancer Maternal Grandfather   . Diabetes Paternal  Grandmother   . Anesthesia problems Neg Hx     Reviw of Systems:  Reviewed in the HPI.  All other systems are negative.  Physical Exam: Blood pressure 110/64, pulse 89, resp. rate 20, height 5\' 3"  (1.6 m), weight 129 lb 12.8 oz (58.877 kg), last menstrual period 03/20/2011. General: Well developed, well nourished, in no acute distress.  Head: Normocephalic, atraumatic, sclera non-icteric, mucus membranes are moist,   Neck: Supple. Carotids are 2 + without bruits. No JVD  Lungs: Clear bilaterally to auscultation.  Heart: regular rate.  normal  S1 S2. No murmurs, gallops or rubs.  Abdomen: Soft, non-tender, non-distended with normal bowel sounds. No hepatomegaly. No rebound/guarding. No masses.  Msk:  Strength and tone are normal  Extremities: No clubbing or cyanosis. No edema.  Distal pedal pulses are 2+ and equal bilaterally.  Neuro: Alert and oriented X 3. Moves all extremities spontaneously.  Psych:  Responds to questions appropriately with a normal affect.  ECG: 08/04/2011-normal sinus rhythm. Normal EKG.  Assessment / Plan:

## 2011-08-04 NOTE — Assessment & Plan Note (Signed)
Danielle Maxwell presents with some shortness of breath and tachycardia. She has been no signs or symptoms of congestive heart failure. She has a normal cardiac exam. There is no leg edema. Her neck veins are flat. She denies any PND or orthopnea.  Her vital signs are normal today.  I suspect that she is experiencing normal physiologic changes associated with pregnancy. I cannot detect any additional abnormalities. We talked about doing an echocardiogram but at this point, we are both inclined to wait. It  is clearly too early for her to be having peripartum cardiomyopathy.  I'll see her again just prior to delivery. That will be in about 20 weeks. I'll be happy to see her sooner if she has any problems prior to that appointment. She does not smoke and doesn't drink. She eats a fairly good diet.

## 2011-08-04 NOTE — Assessment & Plan Note (Signed)
Danielle Maxwell presents with some shortness of breath and tachycardia. She has been no signs or symptoms of congestive heart failure. She has a normal cardiac exam. There is no leg edema. Her neck veins are flat. She denies any PND or orthopnea.  Her vital signs are normal today.  I suspect that she is experiencing normal physiologic changes associated with pregnancy. I cannot detect any additional abnormalities. We talked about doing an echocardiogram but at this point, we are both inclined to wait. It  is clearly too early for her to be having peripartum cardiomyopathy.  I'll see her again just prior to delivery. That will be about 20 weeks. I'll be happy to see her sooner if she has any problems prior to that appointment. She does not smoke and doesn't drink. She eats a fairly good diet.

## 2011-08-04 NOTE — Patient Instructions (Signed)
Your physician wants you to follow-up in: 5 MONTHS  You will receive a reminder letter in the mail two months in advance. If you don't receive a letter, please call our office to schedule the follow-up appointment.  CALL SOONER IF ANY OTHER PROBLEMS WITH HEART RATE OR SHORTNESS OF [place], [phone], WE WILL THEN GET AN ECHO AND FOLLOW UP APPOINTMENT, JODETTE RN

## 2011-08-15 ENCOUNTER — Ambulatory Visit (INDEPENDENT_AMBULATORY_CARE_PROVIDER_SITE_OTHER): Payer: BC Managed Care – PPO

## 2011-08-15 ENCOUNTER — Ambulatory Visit (INDEPENDENT_AMBULATORY_CARE_PROVIDER_SITE_OTHER): Payer: BC Managed Care – PPO | Admitting: Obstetrics and Gynecology

## 2011-08-15 VITALS — BP 98/58 | Wt 129.0 lb

## 2011-08-15 DIAGNOSIS — Z331 Pregnant state, incidental: Secondary | ICD-10-CM

## 2011-08-15 LAB — US OB DETAIL + 14 WK

## 2011-08-15 NOTE — Progress Notes (Signed)
Patient ID: Danielle Maxwell, female   DOB: 07-18-1980, 31 y.o.   MRN: [id number] Had cardiology consult plan observe and f/o at end of pg unless sx change Reviewed [place] [facility] 9/19  19 /7 anterior placenta anatomy WNL Lavera Guise, CNM

## 2011-09-12 ENCOUNTER — Ambulatory Visit (INDEPENDENT_AMBULATORY_CARE_PROVIDER_SITE_OTHER): Payer: BC Managed Care – PPO | Admitting: Obstetrics and Gynecology

## 2011-09-12 VITALS — BP 104/58 | Wt 134.0 lb

## 2011-09-12 DIAGNOSIS — R0602 Shortness of breath: Secondary | ICD-10-CM

## 2011-09-12 DIAGNOSIS — B951 Streptococcus, group B, as the cause of diseases classified elsewhere: Secondary | ICD-10-CM

## 2011-09-12 DIAGNOSIS — Z331 Pregnant state, incidental: Secondary | ICD-10-CM

## 2011-09-12 DIAGNOSIS — A491 Streptococcal infection, unspecified site: Secondary | ICD-10-CM

## 2011-09-12 DIAGNOSIS — Z8619 Personal history of other infectious and parasitic diseases: Secondary | ICD-10-CM

## 2011-09-12 NOTE — Patient Instructions (Signed)
High Protein Diet A high protein diet means that high protein foods are added to your diet. Getting more protein in the diet is important for a number of reasons. Protein helps the body to build tissue, muscle, and to repair damage. People who have had surgery, injuries such as broken bones, infections, and burns, or illnesses such as cancer, may need more protein in their diet.  SERVING SIZES Measuring foods and serving sizes helps to make sure you are getting the right amount of food. The list below tells how big or small some common serving sizes are.   1 oz.........4 stacked dice.   3 oz.........Deck of cards.   1 tsp........Tip of little finger.   1 tbs........Thumb.   2 tbs........Golf ball.    cup.......Half of a fist.   1 cup........A fist.  FOOD SOURCES OF PROTEIN Listed below are some food sources of protein and the amount of protein they contain. Your Registered Dietitian can calculate how many grams of protein you need for your medical condition. High protein foods can be added to the diet at mealtime or as snacks. Be sure to have at least 1 protein-containing food at each meal and snack to ensure adequate intake.  Meats and Meat Substitutes / Protein (g)  3 oz poultry (chicken, turkey) / 26 g   3 oz tuna, canned in water / 26 g   3 oz fish (cod) / 21 g   3 oz red meat (beef, pork) / 21 g   4 oz tofu / 9 g   1 egg / 6 g    cup egg substitute / 5 g   1 cup dried beans / 15 g   1 cup soy milk / 4 g  Dairy / Protein (g)  1 cup milk (skim, 1%, 2%, whole) / 8 g    cup evaporated milk / 9 g   1 cup buttermilk / 8 g   1 cup low-fat plain yogurt / 11 g   1 cup regular plain yogurt / 9 g    cup cottage cheese / 14 g   1 oz cheddar cheese / 7 g  Nuts / Protein (g)  2 tbs peanut butter / 8 g   1 oz peanuts / 7 g   2 tbs cashews / 5 g   2 tbs almonds / 5 g  Document Released: 04/03/2005 Document Revised: 03/23/2011 Document Reviewed:  01/04/2007 ExitCare Patient Information 2012 [employer].Preventing Preterm Labor Preterm labor is when a pregnant woman has contractions that cause the cervix to open, shorten, and thin before 37 weeks of pregnancy. You will have regular contractions (tightening) 2 to 3 minutes apart. This usually causes discomfort or pain. HOME CARE  Eat a healthy diet.   Take your vitamins as told by your doctor.   Drink enough fluids to keep your pee (urine) clear or pale yellow every day.   Get rest and sleep.   Do not have sex if you are at high risk for preterm labor.   Follow your doctor's advice about activity, medicines, and tests.   Avoid stress.   Avoid hard labor or exercise that lasts for a long time.   Do not smoke.  GET HELP RIGHT AWAY IF:   You are having contractions.   You have belly (abdominal) pain.   You have bleeding from your vagina.   You have pain when you pee (urinate).   You have abnormal discharge from your vagina.   You   have a temperature by mouth above 102 F (38.9 C).  MAKE SURE YOU:  Understand these instructions.   Will watch your condition.   Will get help if you are not doing well or get worse.  Document Released: 06/30/2008 Document Revised: 03/23/2011 Document Reviewed: 06/30/2008 Exit[employer] Patient Information 2012 [employer]. 

## 2011-09-12 NOTE — Progress Notes (Signed)
[redacted]w[redacted]d Still having occ SOB 2-3x/week, not having palpitations, not affecting her routine Works [employer], commissioning is eating, but not big meals, occ has nausea  C/o low back pain, rv'd stretching and ACOG handout Wants 4D [place] rv'd PTL sx's and FM Plan: 1hr gtt NV and growth [place] for no wgt gain RTO 4wks

## 2011-09-13 DIAGNOSIS — Z8619 Personal history of other infectious and parasitic diseases: Secondary | ICD-10-CM | POA: Insufficient documentation

## 2011-09-25 ENCOUNTER — Telehealth: Payer: Self-pay | Admitting: Obstetrics and Gynecology

## 2011-09-25 NOTE — Telephone Encounter (Signed)
Triage/cht received 

## 2011-09-25 NOTE — Telephone Encounter (Signed)
PT CALLED IS 25 WKS 5 DAYS, STATES OVER WKND HAD SOME VAGINAL PAIN AND SWELLING, DENIES ANY D/C/BLDG, HAS SOME PAIN IN AM W/ URINATION BUT DOES NOT LAST ALL DAY, SOAKED IN TUB W/ EPSON SALTS AND DID GET SOME RELIEF, HAS +FM, ?'S IF OK, ADVISED PER CHS IT IS NORMAL AS LONG AS THERE IS NO D/C, POSSIBLE COMING FROM XTRA BLOOD FLOW AND CAUSING EXTRA WT TO THE UTERUS AND IT WEIGHS DOWN, PT VOICES UNDERSTANDING, WILL CALL BACK WITH ANY CHANGES OR CONCERNS.

## 2011-10-10 ENCOUNTER — Other Ambulatory Visit: Payer: BC Managed Care – PPO

## 2011-10-10 ENCOUNTER — Other Ambulatory Visit: Payer: Self-pay | Admitting: Obstetrics and Gynecology

## 2011-10-10 ENCOUNTER — Ambulatory Visit (INDEPENDENT_AMBULATORY_CARE_PROVIDER_SITE_OTHER): Payer: BC Managed Care – PPO | Admitting: Obstetrics and Gynecology

## 2011-10-10 ENCOUNTER — Encounter: Payer: Self-pay | Admitting: Obstetrics and Gynecology

## 2011-10-10 ENCOUNTER — Ambulatory Visit (INDEPENDENT_AMBULATORY_CARE_PROVIDER_SITE_OTHER): Payer: BC Managed Care – PPO

## 2011-10-10 VITALS — BP 90/58 | Wt 142.0 lb

## 2011-10-10 DIAGNOSIS — IMO0002 Reserved for concepts with insufficient information to code with codable children: Secondary | ICD-10-CM | POA: Insufficient documentation

## 2011-10-10 DIAGNOSIS — Z331 Pregnant state, incidental: Secondary | ICD-10-CM

## 2011-10-10 DIAGNOSIS — O9989 Other specified diseases and conditions complicating pregnancy, childbirth and the puerperium: Secondary | ICD-10-CM

## 2011-10-10 DIAGNOSIS — O409XX0 Polyhydramnios, unspecified trimester, not applicable or unspecified: Secondary | ICD-10-CM

## 2011-10-10 NOTE — Progress Notes (Signed)
Complains of vaginal swelling in the a.m. glucola given.

## 2011-10-10 NOTE — Progress Notes (Signed)
[place] today: EFW 1370gm, 3lbs, 90.6%ile.  AFI 24.55, 97%ile.  Cervix 3.97. Vtx . Glucola, RPR, and Hgb today. O+ type Repeat US for growth and fluid and NV

## 2011-10-11 LAB — GLUCOSE TOLERANCE, 1 HOUR (50G) W/O FASTING: Glucose, 1 Hour GTT: 90 mg/dL (ref 70–140)

## 2011-10-11 LAB — GLUCOSE TOLERANCE, 1 HOUR

## 2011-10-11 LAB — RPR

## 2011-10-17 LAB — US OB FOLLOW UP

## 2011-10-24 ENCOUNTER — Encounter: Payer: Self-pay | Admitting: Obstetrics and Gynecology

## 2011-10-24 ENCOUNTER — Ambulatory Visit (INDEPENDENT_AMBULATORY_CARE_PROVIDER_SITE_OTHER): Payer: BC Managed Care – PPO

## 2011-10-24 ENCOUNTER — Ambulatory Visit (INDEPENDENT_AMBULATORY_CARE_PROVIDER_SITE_OTHER): Payer: BC Managed Care – PPO | Admitting: Obstetrics and Gynecology

## 2011-10-24 VITALS — BP 102/58 | Wt 147.0 lb

## 2011-10-24 DIAGNOSIS — O402XX0 Polyhydramnios, second trimester, not applicable or unspecified: Secondary | ICD-10-CM | POA: Insufficient documentation

## 2011-10-24 DIAGNOSIS — O3660X0 Maternal care for excessive fetal growth, unspecified trimester, not applicable or unspecified: Secondary | ICD-10-CM

## 2011-10-24 DIAGNOSIS — O409XX0 Polyhydramnios, unspecified trimester, not applicable or unspecified: Secondary | ICD-10-CM

## 2011-10-24 DIAGNOSIS — IMO0002 Reserved for concepts with insufficient information to code with codable children: Secondary | ICD-10-CM

## 2011-10-24 DIAGNOSIS — Z331 Pregnant state, incidental: Secondary | ICD-10-CM

## 2011-10-24 NOTE — Progress Notes (Signed)
Glucola normal No complaints Unexplained mild polyhydramnios: repeat sono in 2 weeks. PTL warnings discussed

## 2011-10-24 NOTE — Progress Notes (Signed)
Sono:  EFW  1808g   AFI  27.62  CX 4.02  Vertex presentation, anterio placenta.   AFI is polyhydramnios (>95th%tile)

## 2011-10-24 NOTE — Progress Notes (Signed)
C/o braxton hicks

## 2011-11-07 ENCOUNTER — Ambulatory Visit (INDEPENDENT_AMBULATORY_CARE_PROVIDER_SITE_OTHER): Payer: BC Managed Care – PPO | Admitting: Obstetrics and Gynecology

## 2011-11-07 ENCOUNTER — Ambulatory Visit (INDEPENDENT_AMBULATORY_CARE_PROVIDER_SITE_OTHER): Payer: BC Managed Care – PPO

## 2011-11-07 ENCOUNTER — Encounter: Payer: Self-pay | Admitting: Obstetrics and Gynecology

## 2011-11-07 VITALS — BP 102/56 | Ht 63.0 in | Wt 148.0 lb

## 2011-11-07 DIAGNOSIS — O409XX0 Polyhydramnios, unspecified trimester, not applicable or unspecified: Secondary | ICD-10-CM

## 2011-11-07 NOTE — Progress Notes (Signed)
Pt voices no complaints today.  S>D U/S EFW; 4LB 14OZ FLUID; Nl Impression: Nl growth  Nl fluid Size still greater than dates Hx of 8lb 11oz infant in prior preg

## 2011-11-09 LAB — US OB FOLLOW UP

## 2011-11-21 ENCOUNTER — Encounter: Payer: Self-pay | Admitting: Obstetrics and Gynecology

## 2011-11-21 ENCOUNTER — Ambulatory Visit (INDEPENDENT_AMBULATORY_CARE_PROVIDER_SITE_OTHER): Payer: BC Managed Care – PPO | Admitting: Obstetrics and Gynecology

## 2011-11-21 VITALS — BP 100/58 | Wt 150.0 lb

## 2011-11-21 DIAGNOSIS — Z3490 Encounter for supervision of normal pregnancy, unspecified, unspecified trimester: Secondary | ICD-10-CM

## 2011-11-21 DIAGNOSIS — Z331 Pregnant state, incidental: Secondary | ICD-10-CM

## 2011-11-21 NOTE — Progress Notes (Signed)
[redacted]w[redacted]d C/o swelling in hands feet and labia.  No other sxs. FKCs RTO 2wks

## 2011-12-05 ENCOUNTER — Ambulatory Visit (INDEPENDENT_AMBULATORY_CARE_PROVIDER_SITE_OTHER): Payer: BC Managed Care – PPO

## 2011-12-05 VITALS — BP 116/62 | Wt 151.0 lb

## 2011-12-05 DIAGNOSIS — Z331 Pregnant state, incidental: Secondary | ICD-10-CM

## 2011-12-05 NOTE — Progress Notes (Signed)
Pt has no concerns

## 2011-12-05 NOTE — Progress Notes (Signed)
[redacted]w[redacted]d; GBS today.  Int os Ft/ext 1cm/50/-2, soft.  Rev'd labor s/s & FKC.  S.o at visit.  Pt still working(retail).  No concerns or questions.

## 2011-12-07 LAB — STREP B DNA PROBE: GBSP: POSITIVE

## 2011-12-08 DIAGNOSIS — O99820 Streptococcus B carrier state complicating pregnancy: Secondary | ICD-10-CM | POA: Insufficient documentation

## 2011-12-14 ENCOUNTER — Encounter: Payer: Self-pay | Admitting: Obstetrics and Gynecology

## 2011-12-14 ENCOUNTER — Ambulatory Visit (INDEPENDENT_AMBULATORY_CARE_PROVIDER_SITE_OTHER): Payer: BC Managed Care – PPO | Admitting: Obstetrics and Gynecology

## 2011-12-14 VITALS — BP 98/60 | Wt 153.0 lb

## 2011-12-14 DIAGNOSIS — Z3480 Encounter for supervision of other normal pregnancy, unspecified trimester: Secondary | ICD-10-CM

## 2011-12-14 NOTE — Progress Notes (Signed)
Pt requests cervix check.  

## 2011-12-14 NOTE — Progress Notes (Signed)
A/P GBS positive Fetal kick counts reviewed Labor reviewed with pt All patients  questions answered 

## 2011-12-14 NOTE — Patient Instructions (Signed)
Group B Strep During Pregnancy Group B strep (GBS) is a type of bacteria often found in healthy women. GBS usually does not cause any symptoms or harm to healthy adult women, but the bacteria can make a baby very sick if it is passed to the baby during childbirth. GBS is not a sexually transmitted disease (STD). GBS is different from Group A strep, the bacteria that causes "strep throat." CAUSES  GBS bacteria can be found in the intestinal, reproductive, and urinary tracts of women. It can also be found in the female genital tract, most often in the vagina and rectal areas.  SYMPTOMS  In pregnancy, GBS can be in the following places:  Genital tract without symptoms.   Rectum without symptoms.   Urine with or without symptoms (asymptomatic bacteriuria).   Urinary symptoms can include pain, frequency, urgency, and blood with urination (cystitis).  Pregnant women who are infected with GBS are at increased risk of:  Early (premature) labor and delivery.   Prolonged rupture of the membranes.   Infection in the following places:   Bladder.   Kidneys (pyelonephritis).   Bag of waters or placenta (chorioamnionitis).   Uterus (endometritis) after delivery.  Newborns who are infected with GBS can develop:  Lung infection (pneumonia).   Blood infection (septicemia).   Infection of the lining of the brain and spinal cord (meningitis).  DIAGNOSIS  Diagnosis of GBS infection is made by screening tests done when you are 35 to [redacted] weeks pregnant. The test (culture) is an easy swab of the vagina and rectum. A sample of your urine might also be checked for the bacteria. Talk with your caregiver about a plan for labor if your test shows that you carry the GBS bacteria. TREATMENT  If results of the culture are positive, showing that GBS is present, you likely will receive treatment with antibiotic medicines during labor. This will help prevent GBS from being passed to your baby. The antibiotics  work only if they are given during labor. If treatment is given earlier in pregnancy, the bacteria may regrow and be present during labor. Tell your caregiver if you are allergic to penicillin or other antibiotics. Antibiotics are also given if:   Infection of the membranes (amnionitis) is suspected.   Labor begins or there is rupture of the membranes before 37 weeks of pregnancy and there is a high risk of delivering the baby.   The mother has a past history of giving birth to an infant with GBS infection.   The culture status is unknown (culture not performed or result not available) and there is:   Fever during labor.   Preterm labor (less than 37 weeks of pregnancy).   Prolonged rupture of membranes (18 hours or more).  Treatment of the mother during labor is not recommended when:  A planned cesarean delivery is done and there is no labor or ruptured membranes. This is true even if the mother tested positive for GBS.   There is a negative culture for GBS screening during the pregnancy, regardless of the risk factors during labor.  The infant will receive antibiotics if the infant tested positive for GBS or has signs and symptoms that suggest GBS infection is present. It is not recommended to routinely give antibiotics to infants whose mothers received antibiotic treatment during labor. HOME CARE INSTRUCTIONS   Take all antibiotics as prescribed by your caregiver.   Only take medicine as directed by your caregiver.   Continue with prenatal visits and   care.   Return for follow-up appointments and cultures.   Follow your caregiver's instructions.  SEEK MEDICAL CARE IF:   You have pain with urination.   You have frequent urination.   You have blood in your urine.  SEEK IMMEDIATE MEDICAL CARE IF:  You have a fever.   You have pain in the back, side, or uterus.   You have chills.   You have abdominal swelling (distension) or pain.   You have labor pains (contractions)  every 10 minutes or more often.   You are leaking fluid or bleeding from your vagina.   You have pelvic pressure that feels like your baby is pushing down.   You have a low, dull backache.   You have cramps that feel like your period.   You have abdominal cramps with or without diarrhea.   You have repeated vomiting and diarrhea.   You have trouble breathing.   You develop confusion.   You have stiffness of your body or neck.  MAKE SURE YOU:   Understand these instructions.   Will watch your condition.   Will get help right away if you are not doing well or get worse.  Document Released: 07/11/2007 Document Revised: 03/23/2011 Document Reviewed: 08/14/2010 ExitCare Patient Information 2012 [employer]. 

## 2011-12-22 ENCOUNTER — Encounter: Payer: Self-pay | Admitting: Obstetrics and Gynecology

## 2011-12-22 ENCOUNTER — Ambulatory Visit (INDEPENDENT_AMBULATORY_CARE_PROVIDER_SITE_OTHER): Payer: BC Managed Care – PPO | Admitting: Obstetrics and Gynecology

## 2011-12-22 VITALS — BP 102/60 | Wt 152.0 lb

## 2011-12-22 DIAGNOSIS — Z331 Pregnant state, incidental: Secondary | ICD-10-CM

## 2011-12-22 DIAGNOSIS — Z3490 Encounter for supervision of normal pregnancy, unspecified, unspecified trimester: Secondary | ICD-10-CM

## 2011-12-22 NOTE — Progress Notes (Signed)
Feels occas ctxs.  Also questions whether she has been leaking. N neg, P neg FKCs and labor precautions RTO 1wk Note to excuse from work

## 2011-12-26 ENCOUNTER — Encounter: Payer: Self-pay | Admitting: Obstetrics and Gynecology

## 2011-12-26 ENCOUNTER — Ambulatory Visit (INDEPENDENT_AMBULATORY_CARE_PROVIDER_SITE_OTHER): Payer: BC Managed Care – PPO | Admitting: Obstetrics and Gynecology

## 2011-12-26 VITALS — BP 90/52 | Wt 152.0 lb

## 2011-12-26 DIAGNOSIS — Z3490 Encounter for supervision of normal pregnancy, unspecified, unspecified trimester: Secondary | ICD-10-CM

## 2011-12-26 DIAGNOSIS — Z331 Pregnant state, incidental: Secondary | ICD-10-CM

## 2011-12-26 NOTE — Progress Notes (Signed)
Doing well, but ready. No concerns. Declined VE.

## 2011-12-26 NOTE — Progress Notes (Signed)
No concerns per pt  Pt declines cx check

## 2011-12-31 ENCOUNTER — Encounter (HOSPITAL_COMMUNITY): Payer: Self-pay | Admitting: Anesthesiology

## 2011-12-31 ENCOUNTER — Encounter (HOSPITAL_COMMUNITY): Payer: Self-pay | Admitting: *Deleted

## 2011-12-31 ENCOUNTER — Inpatient Hospital Stay (HOSPITAL_COMMUNITY): Payer: BC Managed Care – PPO | Admitting: Anesthesiology

## 2011-12-31 ENCOUNTER — Inpatient Hospital Stay (HOSPITAL_COMMUNITY)
Admission: AD | Admit: 2011-12-31 | Discharge: 2012-01-02 | DRG: 373 | Disposition: A | Discharge status: Home / Self Care | Payer: BC Managed Care – PPO | Source: Ambulatory Visit | Attending: Obstetrics and Gynecology | Admitting: Obstetrics and Gynecology

## 2011-12-31 DIAGNOSIS — O99892 Other specified diseases and conditions complicating childbirth: Secondary | ICD-10-CM | POA: Diagnosis present

## 2011-12-31 DIAGNOSIS — O99820 Streptococcus B carrier state complicating pregnancy: Secondary | ICD-10-CM

## 2011-12-31 DIAGNOSIS — O409XX0 Polyhydramnios, unspecified trimester, not applicable or unspecified: Secondary | ICD-10-CM | POA: Diagnosis present

## 2011-12-31 DIAGNOSIS — O3660X0 Maternal care for excessive fetal growth, unspecified trimester, not applicable or unspecified: Secondary | ICD-10-CM | POA: Diagnosis present

## 2011-12-31 DIAGNOSIS — Z8619 Personal history of other infectious and parasitic diseases: Secondary | ICD-10-CM

## 2011-12-31 DIAGNOSIS — IMO0001 Reserved for inherently not codable concepts without codable children: Secondary | ICD-10-CM

## 2011-12-31 DIAGNOSIS — Z22330 Carrier of Group B streptococcus: Secondary | ICD-10-CM

## 2011-12-31 LAB — CBC
Hemoglobin: 12.1 g/dL (ref 12.0–15.0)
MCH: 28.7 pg (ref 26.0–34.0)
MCV: 91.5 fL (ref 78.0–100.0)
Platelets: 225 10*3/uL (ref 150–400)
RBC: 4.22 MIL/uL (ref 3.87–5.11)
WBC: 7.9 10*3/uL (ref 4.0–10.5)

## 2011-12-31 LAB — TYPE AND SCREEN: ABO/RH(D): O POS

## 2011-12-31 MED ORDER — HYDROXYZINE HCL 50 MG/ML IM SOLN
50.0000 mg | Freq: Four times a day (QID) | INTRAMUSCULAR | Status: DC | PRN
  Filled 2011-12-31: qty 1

## 2011-12-31 MED ORDER — LACTATED RINGERS IV SOLN: 500.0000 mL | INTRAVENOUS | Status: DC | PRN

## 2011-12-31 MED ORDER — OXYTOCIN BOLUS FROM INFUSION
500.0000 mL | Freq: Once | INTRAVENOUS | Status: AC
  Administered 2011-12-31: 500 mL via INTRAVENOUS
  Filled 2011-12-31: qty 500

## 2011-12-31 MED ORDER — LIDOCAINE HCL (PF) 1 % IJ SOLN
30.0000 mL | INTRAMUSCULAR | Status: DC | PRN
  Administered 2011-12-31: 30 mL via SUBCUTANEOUS
  Filled 2011-12-31: qty 30

## 2011-12-31 MED ORDER — HYDROXYZINE HCL 50 MG PO TABS: 50.0000 mg | ORAL_TABLET | Freq: Four times a day (QID) | ORAL | Status: DC | PRN

## 2011-12-31 MED ORDER — IBUPROFEN 600 MG PO TABS
600.0000 mg | ORAL_TABLET | Freq: Four times a day (QID) | ORAL | Status: DC | PRN
  Administered 2011-12-31: 600 mg via ORAL
  Filled 2011-12-31: qty 1

## 2011-12-31 MED ORDER — LACTATED RINGERS IV SOLN
INTRAVENOUS | Status: DC
  Administered 2011-12-31 (×2): via INTRAVENOUS

## 2011-12-31 MED ORDER — FENTANYL 2.5 MCG/ML BUPIVACAINE 1/10 % EPIDURAL INFUSION (WH - ANES)
14.0000 mL/h | INTRAMUSCULAR | Status: DC
  Administered 2011-12-31: 14 mL/h via EPIDURAL
  Filled 2011-12-31 (×2): qty 60

## 2011-12-31 MED ORDER — PENICILLIN G POTASSIUM 5000000 UNITS IJ SOLR
2.5000 10*6.[IU] | INTRAVENOUS | Status: DC
  Administered 2011-12-31: 2.5 10*6.[IU] via INTRAVENOUS
  Filled 2011-12-31 (×3): qty 2.5

## 2011-12-31 MED ORDER — EPHEDRINE 5 MG/ML INJ
10.0000 mg | INTRAVENOUS | Status: DC | PRN
  Filled 2011-12-31: qty 4

## 2011-12-31 MED ORDER — PHENYLEPHRINE 40 MCG/ML (10ML) SYRINGE FOR IV PUSH (FOR BLOOD PRESSURE SUPPORT): 80.0000 ug | PREFILLED_SYRINGE | INTRAVENOUS | Status: DC | PRN

## 2011-12-31 MED ORDER — PHENYLEPHRINE 40 MCG/ML (10ML) SYRINGE FOR IV PUSH (FOR BLOOD PRESSURE SUPPORT)
80.0000 ug | PREFILLED_SYRINGE | INTRAVENOUS | Status: DC | PRN
  Filled 2011-12-31: qty 5

## 2011-12-31 MED ORDER — OXYCODONE-ACETAMINOPHEN 5-325 MG PO TABS: 1.0000 | ORAL_TABLET | ORAL | Status: DC | PRN

## 2011-12-31 MED ORDER — FENTANYL 2.5 MCG/ML BUPIVACAINE 1/10 % EPIDURAL INFUSION (WH - ANES)
INTRAMUSCULAR | Status: DC | PRN
  Administered 2011-12-31: 14 mL/h via EPIDURAL

## 2011-12-31 MED ORDER — DIPHENHYDRAMINE HCL 50 MG/ML IJ SOLN: 12.5000 mg | INTRAMUSCULAR | Status: DC | PRN

## 2011-12-31 MED ORDER — CITRIC ACID-SODIUM CITRATE 334-500 MG/5ML PO SOLN: 30.0000 mL | ORAL | Status: DC | PRN

## 2011-12-31 MED ORDER — ONDANSETRON HCL 4 MG/2ML IJ SOLN: 4.0000 mg | Freq: Four times a day (QID) | INTRAMUSCULAR | Status: DC | PRN

## 2011-12-31 MED ORDER — EPHEDRINE 5 MG/ML INJ: 10.0000 mg | INTRAVENOUS | Status: DC | PRN

## 2011-12-31 MED ORDER — PENICILLIN G POTASSIUM 5000000 UNITS IJ SOLR
5.0000 10*6.[IU] | Freq: Once | INTRAVENOUS | Status: AC
  Administered 2011-12-31: 5 10*6.[IU] via INTRAVENOUS
  Filled 2011-12-31: qty 5

## 2011-12-31 MED ORDER — LACTATED RINGERS IV SOLN: 500.0000 mL | Freq: Once | INTRAVENOUS | Status: DC

## 2011-12-31 MED ORDER — OXYTOCIN 40 UNITS IN LACTATED RINGERS INFUSION - SIMPLE MED
62.5000 mL/h | Freq: Once | INTRAVENOUS | Status: DC
  Filled 2011-12-31: qty 1000

## 2011-12-31 MED ORDER — ACETAMINOPHEN 325 MG PO TABS: 650.0000 mg | ORAL_TABLET | ORAL | Status: DC | PRN

## 2011-12-31 NOTE — MAU Note (Signed)
Pt reports having ctx off and on all morning. Now 5 min apart. Bloody show reports and good fetal movment.

## 2011-12-31 NOTE — Anesthesia Preprocedure Evaluation (Signed)

## 2011-12-31 NOTE — H&P (Signed)
LETESHIA FETHEROLF is a 31 y.o.MB female presenting for ctxs and pink spotting at [redacted]w[redacted]d.  Onset of ctxs around 0700, and have progressively gotten closer together and more painful.  Accompanied by her s.o. "Viviann Spare."  No recent fever or chills.  No LOF.  Spotting this afternoon as ctxs more painful and regular and just w/ wiping.  GFM.  Last ate around 1100.  No UTI or PIH s/s w/ exception of recent daily headaches upon waking (see ROS).   Prenatal course: Pt started care around 11 weeks.  Had normal quad screen and anatomy u/s.  Around 17 weeks started to c/o of SOB w/ ADL's and exertion, and referred immediately to cardiology, however thought pregnancy related, and no treatment recommended, and pt was to observe for worsening s/s and f/u end of pregnancy or PP if persists.  FH measured s>d around 28 weeks and LGA noted w/ EFW varying between 91-97%.  Brief polyhydramnios noted on u/s's between 27-32 weeks, and resolved spontaneously.   OB Hx: G1=EAB '02 G2=SVD '07 at 39 weeks; Female=8+11; epidural; no complications G3=current  Maternal Medical History:  Reason for admission: Reason for admission: contractions.  Contractions: Onset was 6-12 hours ago.   Frequency: regular.   Perceived severity is strong.   5 min apart since on her way to hospital  Fetal activity: Perceived fetal activity is normal.   Last perceived fetal movement was within the past hour.    Prenatal Complications - Diabetes: none. Patient Active Problem List:    Group B streptococcal infection    SOB (shortness of breath)    Pregnancy as incidental finding    History of chlamydia    GBS (group B Streptococcus carrier), +RV culture, currently pregnant        OB History    Grav Para Term Preterm Abortions TAB SAB Ect Mult Living   3 1 1  1  0    1     Past Medical History  Diagnosis Date  . Monilial vaginitis 06/12/2003  . H/O urinary frequency 09/22/2002  . H/O chlamydia infection   . History of varicella   . H/O  rubella   . Yeast infection   . Abnormal Pap smear 2002  . Depression 2008  . Hx: UTI (urinary tract infection) 04/2009  . Polyhydramnios in second trimester    Past Surgical History  Procedure Date  . Wisdom tooth extraction    Family History: family history includes Alcohol abuse in her paternal aunt; Cancer in her maternal grandfather; Diabetes in her father, maternal grandfather, and paternal grandmother; and Hypertension in her maternal aunt, maternal grandfather, and maternal grandmother.  There is no history of Anesthesia problems. Social History:  reports that she has never smoked. She has never used smokeless tobacco. She reports that she does not drink alcohol or use illicit drugs.   Prenatal Transfer Tool  Maternal Diabetes: No Genetic Screening: Normal Maternal Ultrasounds/Referrals: Abnormal:  Findings:   Other: Fetal Ultrasounds or other Referrals:  None Maternal Substance Abuse:  No Significant Maternal Medications:  None Significant Maternal Lab Results:  Lab values include: Group B Strep positive Other Comments:  LGA on u/s this preg; brief poly between 28-31 weeks and resolved.  Review of Systems  Constitutional: Negative.   HENT: Positive for congestion.        Daily headache for about a week upon waking, but resolves spontaneously; generalized.  Also w/ nasal congestion and PND.  Eyes: Negative.   Respiratory: Negative.   Cardiovascular:  Negative.   Gastrointestinal: Positive for diarrhea.       Loose stools over the past week  Genitourinary: Negative.   Skin: Negative.   Neurological: Positive for headaches.    Dilation: 4.5 Effacement (%): 90 Station: -1 Exam by:: H. Terry Bolotin CNM Blood pressure 139/74, pulse 97, temperature 98.3 F (36.8 C), temperature source Oral, resp. rate 18, height 5\' 3"  (1.6 m), weight 153 lb (69.4 kg), last menstrual period 03/20/2011. Maternal Exam:  Uterine Assessment: Contraction strength is moderate.  Contraction  frequency is regular.  uc's q 2-5 min  Abdomen: Patient reports no abdominal tenderness. Estimated fetal weight is 8-9lbs.   Fetal presentation: vertex  Introitus: Normal vulva. Pelvis: adequate for delivery.   Cervix: Cervix evaluated by digital exam.     Fetal Exam Fetal Monitor Review: Mode: ultrasound.   Baseline rate: 130.  Variability: moderate (6-25 bpm).   Pattern: accelerations present and no decelerations.    Fetal State Assessment: Category I - tracings are normal.     Physical Exam  Constitutional: She is oriented to person, place, and time. She appears well-developed and well-nourished.  HENT:  Head: Normocephalic and atraumatic.  Eyes: Pupils are equal, round, and reactive to light.  Cardiovascular: Normal rate.   Respiratory: Effort normal.  GI: Soft.       gravid  Genitourinary:       Cx:  4.5/90/-1; posterior; scant brown d/c on glove. No active bleeding  Musculoskeletal: She exhibits no edema.  Neurological: She is alert and oriented to person, place, and time. She has normal reflexes.  Skin: Skin is warm and dry.  Psychiatric: She has a normal mood and affect. Her behavior is normal. Judgment and thought content normal.    Prenatal labs: ABO, Rh: O/Negative/-- (02/28 0000) Antibody:  negative Rubella: Immune (02/28 0000) RPR: NON REAC (06/25 1032)  HBsAg: Negative (02/28 0000)  HIV: Non-reactive (02/28 0000)  GBS: POSITIVE (08/20 1349)  Quad screen negative 1hr gtt=90 Assessment/Plan: 1. [redacted]w[redacted]d 2. Active labor 3. GBS pos 4. Cat I FHT 5. H/o LGA and Poly during pregnancy  1.  Admit to [facility] w/ Dr. Estanislado Pandy as attending 2.  Routine L&D orders; epidural ASAP 3.  PCN-G for per protocol for GBS prophylaxis 4.  AROM prn augmentation 5.  C/w MD prn 6.  Anticipate SVD  Omarri Eich H 12/31/2011, 4:48 PM

## 2011-12-31 NOTE — Progress Notes (Signed)
Subjective: SROM at 2024--clear fluid.  Pt comfortable.  Family at bedside.   Objective: BP 117/85  Pulse 91  Temp 98.3 F (36.8 C) (Oral)  Resp 18  Ht 5\' 3"  (1.6 m)  Wt 153 lb (69.4 kg)  BMI 27.10 kg/m2  SpO2 100%  LMP 03/20/2011      FHT:  FHR: 130 bpm, variability: moderate,  accelerations:  Present,  decelerations:  Absent UC:   regular, every 2-3 minutes SVE:   Dilation: 8.5 Effacement (%): 90 Station: +1 Exam by:: h. Juvon Teater, cnm Clear fluid; mod amt bloody show; cx more on pt's Right Labs: Lab Results  Component Value Date   WBC 7.9 12/31/2011   HGB 12.1 12/31/2011   HCT 38.6 12/31/2011   MCV 91.5 12/31/2011   PLT 225 12/31/2011    Assessment / Plan: 1. [redacted]w[redacted]d 2. transition  3. GBS pos  Labor: Progressing normally Preeclampsia:  no signs or symptoms of toxicity Fetal Wellbeing:  Category I Pain Control:  Epidural I/D:  n/a Anticipated MOD:  NSVD 1. Pt repositioned to Lt side; 2nd dose of PCN started now.   2.  Push w/ urge or prn. 3.  MD updated and c/w her prn. Madden Garron H 12/31/2011, 8:46 PM

## 2011-12-31 NOTE — Anesthesia Procedure Notes (Signed)
Epidural Patient location during procedure: OB  Preanesthetic Checklist Completed: patient identified, site marked, surgical consent, pre-op evaluation, timeout performed, IV checked, risks and benefits discussed and monitors and equipment checked  Epidural Patient position: sitting Prep: site prepped and draped and DuraPrep Patient monitoring: continuous pulse ox and blood pressure Approach: midline Injection technique: LOR air  Needle:  Needle type: Tuohy  Needle gauge: 17 G Needle length: 9 cm and 9 Needle insertion depth: 5 cm cm Catheter type: closed end flexible Catheter size: 19 Gauge Catheter at skin depth: 10 cm Test dose: negative  Assessment Events: blood not aspirated, injection not painful, no injection resistance, negative IV test and no paresthesia  Additional Notes Dosing of Epidural:  1st dose, through needle ............................................. epi 1:200K + Xylocaine 40 mg  2nd dose, through catheter, after waiting 3 minutes.....epi 1:200K + Xylocaine 40 mg  3rd dose, through catheter after waiting 3 minutes .............................Marcaine   4mg   ( mg Marcaine are expressed as equivilent  cc's medication removed from the 0.1%Bupiv / fentanyl syringe from L&D pump)  ( 2% Xylo charted as a single dose in Epic Meds for ease of charting; actual dosing was fractionated as above, for saftey's sake)  As each dose occurred, patient was free of IV sx; and patient exhibited no evidence of SA injection.  Patient is more comfortable after epidural dosed. Please see RN's note for documentation of vital signs,and FHR which are stable.  Patient reminded not to try to ambulate with numb legs, and that an RN must be present the 1st time she attempts to get up.    

## 2011-12-31 NOTE — Progress Notes (Signed)
Danielle Maxwell is a 31 y.o. G3P1011 at [redacted]w[redacted]d by LMP admitted for active labor  Subjective: Comfortable s/p epidural around 1730.  S.o., mom, sister, and pt's daughter at bedside currently.  PCN started at 1715.    Objective: BP 127/71  Pulse 79  Temp 98.7 F (37.1 C) (Oral)  Resp 18  Ht 5\' 3"  (1.6 m)  Wt 153 lb (69.4 kg)  BMI 27.10 kg/m2  SpO2 100%  LMP 03/20/2011  .Marland Kitchen Filed Vitals:   12/31/11 1801  BP: 127/71  Pulse: 79  Temp:   Resp: 18      FHT:  FHR: 130 bpm, variability: moderate,  accelerations:  Present,  decelerations:  Absent UC:   regular, every 2-3 minutes SVE:   Dilation: 4.5 Effacement (%): 90 Station: -1 Exam by:: Rexene Edison Kenyatta Keidel CNM Deferred cervical exam at present. Labs: Lab Results  Component Value Date   WBC 7.9 12/31/2011   HGB 12.1 12/31/2011   HCT 38.6 12/31/2011   MCV 91.5 12/31/2011   PLT 225 12/31/2011    Assessment / Plan: 1.[redacted]w[redacted]d 2. active labor 3. GBS pos  Labor: Progressing normally Preeclampsia:  no signs or symptoms of toxicity Fetal Wellbeing:  Category I Pain Control:  Epidural I/D:  n/a Anticipated MOD:  NSVD 1.  Plan AROM around 2045 when 2nd dose of PCN can be started; IUPC/Pitocin prn further augmentation. 2.  Pt declined cervical exam at present. 3.  C/w MD prn. Chestina Komatsu H 12/31/2011, 6:59 PM

## 2012-01-01 LAB — CBC
MCH: 29.2 pg (ref 26.0–34.0)
MCHC: 32.1 g/dL (ref 30.0–36.0)
Platelets: 209 10*3/uL (ref 150–400)
RDW: 17.2 % — ABNORMAL HIGH (ref 11.5–15.5)

## 2012-01-01 MED ORDER — MAGNESIUM HYDROXIDE 400 MG/5ML PO SUSP: 30.0000 mL | ORAL | Status: DC | PRN

## 2012-01-01 MED ORDER — METHYLERGONOVINE MALEATE 0.2 MG PO TABS: 0.2000 mg | ORAL_TABLET | ORAL | Status: DC | PRN

## 2012-01-01 MED ORDER — TETANUS-DIPHTH-ACELL PERTUSSIS 5-2.5-18.5 LF-MCG/0.5 IM SUSP
0.5000 mL | Freq: Once | INTRAMUSCULAR | Status: AC
  Administered 2012-01-02: 0.5 mL via INTRAMUSCULAR

## 2012-01-01 MED ORDER — SIMETHICONE 80 MG PO CHEW: 80.0000 mg | CHEWABLE_TABLET | ORAL | Status: DC | PRN

## 2012-01-01 MED ORDER — IBUPROFEN 600 MG PO TABS
600.0000 mg | ORAL_TABLET | Freq: Four times a day (QID) | ORAL | Status: DC
  Administered 2012-01-01 – 2012-01-02 (×5): 600 mg via ORAL
  Filled 2012-01-01 (×5): qty 1

## 2012-01-01 MED ORDER — DIBUCAINE 1 % RE OINT: 1.0000 "application " | TOPICAL_OINTMENT | RECTAL | Status: DC | PRN

## 2012-01-01 MED ORDER — ONDANSETRON HCL 4 MG/2ML IJ SOLN: 4.0000 mg | INTRAMUSCULAR | Status: DC | PRN

## 2012-01-01 MED ORDER — ONDANSETRON HCL 4 MG PO TABS: 4.0000 mg | ORAL_TABLET | ORAL | Status: DC | PRN

## 2012-01-01 MED ORDER — OXYCODONE-ACETAMINOPHEN 5-325 MG PO TABS
1.0000 | ORAL_TABLET | ORAL | Status: DC | PRN
  Administered 2012-01-02: 1 via ORAL
  Filled 2012-01-01: qty 1

## 2012-01-01 MED ORDER — ZOLPIDEM TARTRATE 5 MG PO TABS: 5.0000 mg | ORAL_TABLET | Freq: Every evening | ORAL | Status: DC | PRN

## 2012-01-01 MED ORDER — WITCH HAZEL-GLYCERIN EX PADS: 1.0000 "application " | MEDICATED_PAD | CUTANEOUS | Status: DC | PRN

## 2012-01-01 MED ORDER — BENZOCAINE-MENTHOL 20-0.5 % EX AERO
1.0000 "application " | INHALATION_SPRAY | CUTANEOUS | Status: DC | PRN
  Administered 2012-01-01: 1 via TOPICAL
  Filled 2012-01-01: qty 56

## 2012-01-01 MED ORDER — DIPHENHYDRAMINE HCL 25 MG PO CAPS: 25.0000 mg | ORAL_CAPSULE | Freq: Four times a day (QID) | ORAL | Status: DC | PRN

## 2012-01-01 MED ORDER — LANOLIN HYDROUS EX OINT: TOPICAL_OINTMENT | CUTANEOUS | Status: DC | PRN

## 2012-01-01 MED ORDER — INFLUENZA VIRUS VACC SPLIT PF IM SUSP
0.5000 mL | INTRAMUSCULAR | Status: AC
  Administered 2012-01-02: 0.5 mL via INTRAMUSCULAR
  Filled 2012-01-01: qty 0.5

## 2012-01-01 MED ORDER — METHYLERGONOVINE MALEATE 0.2 MG/ML IJ SOLN: 0.2000 mg | INTRAMUSCULAR | Status: DC | PRN

## 2012-01-01 MED ORDER — SENNOSIDES-DOCUSATE SODIUM 8.6-50 MG PO TABS
2.0000 | ORAL_TABLET | Freq: Every day | ORAL | Status: DC
  Administered 2012-01-01: 2 via ORAL

## 2012-01-01 MED ORDER — PRENATAL MULTIVITAMIN CH
1.0000 | ORAL_TABLET | Freq: Every day | ORAL | Status: DC
  Administered 2012-01-01 – 2012-01-02 (×2): 1 via ORAL
  Filled 2012-01-01: qty 1

## 2012-01-01 NOTE — Progress Notes (Signed)
Post Partum Day 1:S/P SVB Subjective: no complaints.  Up ad lib, no syncope or dizziness. Feeding:  Breast Contraceptive plan:   Undecided at present  Objective: Blood pressure 97/62, pulse 95, temperature 97.9 F (36.6 C), temperature source Oral, resp. rate 18, height 5\' 3"  (1.6 m), weight 153 lb (69.4 kg), last menstrual period 03/20/2011, SpO2 97.00%, unknown if currently breastfeeding.  Physical Exam:  General: alert Lochia: appropriate Uterine Fundus: firm Incision: healing well DVT Evaluation: No evidence of DVT seen on physical exam. Negative Homan's sign.   Basename 01/01/12 0525 12/31/11 1628  HGB 11.1* 12.1  HCT 34.6* 38.6    Assessment/Plan: PP day 1 Stable Anticipate d/c tomorrow.   LOS: 1 day   Hollie Wojahn 01/01/2012, 8:15 AM

## 2012-01-02 ENCOUNTER — Encounter: Payer: BC Managed Care – PPO | Admitting: Obstetrics and Gynecology

## 2012-01-02 ENCOUNTER — Other Ambulatory Visit: Payer: Self-pay | Admitting: Obstetrics and Gynecology

## 2012-01-02 DIAGNOSIS — IMO0001 Reserved for inherently not codable concepts without codable children: Secondary | ICD-10-CM

## 2012-01-02 MED ORDER — NORETHINDRONE 0.35 MG PO TABS: 1.0000 | ORAL_TABLET | Freq: Every day | ORAL | Status: DC

## 2012-01-02 MED ORDER — IBUPROFEN 600 MG PO TABS: 600.0000 mg | ORAL_TABLET | Freq: Four times a day (QID) | ORAL | Status: DC | PRN

## 2012-01-02 NOTE — Discharge Summary (Signed)
Physician Discharge Summary  Patient ID: Danielle [facility] MRN: [id number] DOB/AGE: 31-Feb-1982 31 y.o.  Admit date: 12/31/2011 Discharge date: 01/02/2012  Admission Diagnoses: active labor at 4.5 cms/90%/-1, intact.  Discharge Diagnoses:  Principal Problem:  *NSVD (normal spontaneous vaginal delivery) Active Problems:  Second degree laceration of perineum, delivered, current hospitalization   Discharged Condition: stable  Hospital Course: Danielle [facility] was admitted to [facility] with hx of regular contractions her evaluation was that she was in active labor at 4.5 cms /90%/-1. She was admitted to L&D for labor management. She progressed well with epidural anesthesia and had a NSVD of a viable female infant "Danielle [facility]" There was a shoulder delay at deliver and McRoberts was used to assist the delivery of the shoulders. Uncomplicated delivery. Mother had second degree laceration to perineum with Monocryl repair. Lactating mother. Both mother and baby were discharged on  D 2 postpartum.  Birth control: Micronor  F/u at weeks PP fr postpartum visit.    Consults: None  Significant Diagnostic Studies: labs: routine - normal  Treatments: IV hydration and analgesia: ibuprofen 600mg s po q6hrly  Discharge Exam: Blood pressure 100/63, pulse 73, temperature 97.8 F (36.6 C), temperature source Oral, resp. rate 18, height 5\' 3"  (1.6 m), weight 153 lb (69.4 kg), last menstrual period 03/20/2011, SpO2 97.00%, unknown if currently breastfeeding. General appearance: alert, cooperative and no distress Affect : AAO x 3 Lungs: CTAB CV: RRR Abdomen: Soft, N/T, Fundus -2/U,  GI: Normal IO:NGEXBM - Lochia - moderate  Extremities: Normal  Disposition: 01-Home or Self Care     Medication List     As of 01/02/2012  9:28 AM    ASK your doctor about these medications         loratadine 10 MG tablet   Commonly known as: CLARITIN   Take 10 mg by mouth daily.      prenatal multivitamin Tabs   Take 1  tablet by mouth daily.           Follow-up Information    Follow up with [facility]. In 6 weeks. (As needed)    Contact information:   [address] [place] [address] [phone]       NSVD: viable female infant " Danielle [facility]" with HS  Signed: Flor [facility], CNM. 01/02/2012, 9:28 AM

## 2012-02-15 ENCOUNTER — Ambulatory Visit (INDEPENDENT_AMBULATORY_CARE_PROVIDER_SITE_OTHER): Payer: BC Managed Care – PPO | Admitting: Obstetrics and Gynecology

## 2012-02-15 ENCOUNTER — Encounter: Payer: Self-pay | Admitting: Obstetrics and Gynecology

## 2012-02-15 NOTE — Progress Notes (Signed)
Danielle Maxwell is a 31 y.o. female who presents for a postpartum visit.   Type of delivery:  SVB, with HS--mild shoulder dystocia, 2nd degree laceration  Patient reports doing well--did not have laceration with 1st delivery, so "had to get used to that" this time.  Some residual lower back pain when tired, but does not require meds.    Hx remarkable for: Patient Active Problem List  Diagnosis  . Group B streptococcal infection  . SOB (shortness of breath)  . Pregnancy as incidental finding  . History of chlamydia  . GBS (group B Streptococcus carrier), +RV culture, currently pregnant  . NSVD (normal spontaneous vaginal delivery)  . Second degree laceration of perineum, delivered, current hospitalization      PPDS = 9, but denies PP depression, no SI/HI--feels has handled stressors well  Contraception plan:  On Micronor    I have fully reviewed the prenatal and intrapartum course   Patient has not been sexually active since delivery.   The following portions of the patient's history were reviewed and updated as appropriate: allergies, current medications, past family history, past medical history, past social history, past surgical history and problem list.  Review of Systems Pertinent items are noted in HPI.   Objective:    BP 120/70  Resp 16  Wt 133 lb (60.328 kg)  LMP 02/08/2012  Breastfeeding? Yes  General:  alert, cooperative and no distress     Lungs: clear to auscultation bilaterally  Heart:  regular rate and rhythm, S1, S2 normal, no murmur  Abdomen: soft, non-tender; bowel sounds normal; no masses,  no organomegaly.   Incision:  NA   Vulva:  normal  Vagina: normal vagina, well-healed 2nd degree laceration  Cervix:  normal  Uterus: normal size, contour, position, consistency, mobility, non-tender, well-involuted  Adnexa:  normal adnexa             Assessment:     Normal postpartum exam.  Pap smear:   not done at today's visit.   Due 1/201  Plan:   Follow-up at annual or prn. Rxs: None Will f/u with any increase in back pain or any other issues. Continue Micronor for duration of breastfeeding  Nigel Bridgeman CNM, [place] 02/15/2012 12:41 PM

## 2012-02-15 NOTE — Progress Notes (Signed)
ENEDELIA VARELA  is 6 weeks postpartum following a spontaneous vaginal delivery at 34 gestational weeks Date: 01/04/12 female baby named Neligh delivered by [employer], CNM.  Breastfeeding: yes Bottlefeeding:  Yes occ.  Post-partum blues / depression:  no  EPDS score: 9 History of abnormal Pap:  no  Last Pap: Date  05/16/11 WNL Gestational diabetes:  no  Contraception:  Desires oral contraceptives (estrogen/progesterone)  Normal urinary function:  yes Normal GI function:  yes Returning to work:  no

## 2014-02-11 ENCOUNTER — Ambulatory Visit (INDEPENDENT_AMBULATORY_CARE_PROVIDER_SITE_OTHER)
Admission: RE | Admit: 2014-02-11 | Discharge: 2014-02-11 | Disposition: A | Discharge status: Home / Self Care | Payer: BC Managed Care – PPO | Source: Ambulatory Visit | Attending: Family Medicine | Admitting: Family Medicine

## 2014-02-11 ENCOUNTER — Encounter: Payer: Self-pay | Admitting: Family Medicine

## 2014-02-11 ENCOUNTER — Ambulatory Visit (INDEPENDENT_AMBULATORY_CARE_PROVIDER_SITE_OTHER): Payer: BC Managed Care – PPO | Admitting: Family Medicine

## 2014-02-11 VITALS — BP 106/68 | HR 77 | Temp 98.0°F | Ht 63.25 in | Wt 148.0 lb

## 2014-02-11 DIAGNOSIS — M25562 Pain in left knee: Secondary | ICD-10-CM | POA: Insufficient documentation

## 2014-02-11 DIAGNOSIS — M722 Plantar fascial fibromatosis: Secondary | ICD-10-CM

## 2014-02-11 DIAGNOSIS — F4323 Adjustment disorder with mixed anxiety and depressed mood: Secondary | ICD-10-CM

## 2014-02-11 DIAGNOSIS — Z23 Encounter for immunization: Secondary | ICD-10-CM

## 2014-02-11 DIAGNOSIS — R6889 Other general symptoms and signs: Secondary | ICD-10-CM | POA: Insufficient documentation

## 2014-02-11 DIAGNOSIS — R198 Other specified symptoms and signs involving the digestive system and abdomen: Secondary | ICD-10-CM

## 2014-02-11 NOTE — Progress Notes (Signed)
Subjective:    Patient ID: Danielle [facility], female    DOB: [birth year], 33 y.o.   MRN: [id number]  HPI  33 yo pleasant female here to establish care with the following concerns:  1.  Sensation of throat being swollen- ongoing since this summer.  Does not have throat pain.  Cannot tell what makes it worse.  She has noticed that she does not feel it when she is drinking liquid or laying completely flat. Not chocking on solids or liquids.  No pain with swallowing, no difficulty swallowing. Does not feel it's progressing but it is not getting any better. Denies feeling of acid or food coming up in her throat. Has not had recent symptoms of post nasal drip or allergic rhinitis.  2.  Anxiety/depression- every few weeks, she gets overwhelmed.  Working full time and has two small kids. Denies having panic attacks.  Sleeping well.  No SI or HI.  Feels appetite is good.  Was on zoloft years ago but did not like taking something everyday.  She is asking for something "as needed to take the edge off."  3.  Left knee swelling- intermittent for past 6 months.  She does stand on her feet all day.  No known injury.  Not currently swollen.  No erythema or warmth when it swells.  4.  Pain on bottom of feet- only occurs when she first wakes up in the morning.  Seems to get better after first few steps of the days. No numbness or tingling in her feet.  Current Outpatient Prescriptions on File Prior to Visit  Medication Sig Dispense Refill  . ibuprofen (ADVIL,MOTRIN) 600 MG tablet Take 1 tablet (600 mg total) by mouth every 6 (six) hours as needed for pain.  30 tablet  2   No current facility-administered medications on file prior to visit.    No Known Allergies  Past Medical History  Diagnosis Date  . Monilial vaginitis 06/12/2003  . H/O urinary frequency 09/22/2002  . H/O chlamydia infection   . History of varicella   . H/O rubella   . Yeast infection   . Abnormal Pap smear 2002  . Depression  2008  . Hx: UTI (urinary tract infection) 04/2009  . Polyhydramnios in second trimester   . NSVD (normal spontaneous vaginal delivery) 12/31/2011    2227    Past Surgical History  Procedure Laterality Date  . Wisdom tooth extraction      Family History  Problem Relation Age of Onset  . Diabetes Father   . Hypertension Maternal Aunt   . Hypertension Maternal Grandmother   . Hypertension Maternal Grandfather   . Diabetes Maternal Grandfather   . Cancer Maternal Grandfather   . Diabetes Paternal Grandmother   . Anesthesia problems Neg Hx     History   Social History  . Marital Status: Married    Spouse Name: N/A    Number of Children: N/A  . Years of Education: N/A   Occupational History  . Not on file.   Social History Main Topics  . Smoking status: Never Smoker   . Smokeless tobacco: Never Used  . Alcohol Use: Yes  . Drug Use: No  . Sexual Activity: Yes    Partners: Male    Birth Control/ Protection: None   Other Topics Concern  . Not on file   Social History Narrative  . No narrative on file   The PMH, PSH, Social History, Family History, Medications, and allergies [facility]  [facility], and have been updated if relevant.  Review of Systems  Constitutional: Negative for fever, appetite change and fatigue.  HENT: Negative for congestion, dental problem, drooling, ear pain, facial swelling, postnasal drip, rhinorrhea, sinus pressure, sneezing, sore throat and voice change.   Respiratory: Negative.   Cardiovascular: Negative.   Musculoskeletal: Positive for joint swelling. Negative for gait problem, myalgias, neck pain and neck stiffness.  Skin: Negative.   Psychiatric/Behavioral: Negative for suicidal ideas, sleep disturbance and self-injury.  All other systems reviewed and are negative.      Objective:   Physical Exam  Constitutional: She appears well-developed and well-nourished. No distress.  HENT:  Head: Normocephalic.  Nose: Nose normal.    Mouth/Throat: Uvula is midline, oropharynx is clear and moist and mucous membranes are normal. Normal dentition. No oropharyngeal exudate, posterior oropharyngeal edema or posterior oropharyngeal erythema.  Cardiovascular: Normal rate.   Musculoskeletal:       Left knee: She exhibits normal range of motion, no swelling, no effusion, no deformity, normal alignment, no LCL laxity, no bony tenderness and normal meniscus.       Right foot: Normal.       Left foot: Normal.   BP 106/68  Pulse 77  Temp(Src) 98 F (36.7 C) (Oral)  Ht 5' 3.25" (1.607 m)  Wt 148 lb (67.132 kg)  BMI 26.00 kg/m2  SpO2 99%  LMP 02/06/2014  Breastfeeding? No        Assessment & Plan:

## 2014-02-11 NOTE — Assessment & Plan Note (Signed)
Persistent with intermittent effusions. Given duration of symptoms, xray today. The patient indicates understanding of these issues and agrees with the plan.

## 2014-02-11 NOTE — Progress Notes (Signed)
Pre visit review using our clinic review tool, if applicable. No additional management support is needed unless otherwise documented below in the visit note. 

## 2014-02-11 NOTE — Assessment & Plan Note (Signed)
New- discussed course and tx of plantar fascitis. Handout given for exercises and supportive care from sports med advisor. Call or return to clinic prn if these symptoms worsen or fail to improve as anticipated. The patient indicates understanding of these issues and agrees with the plan.

## 2014-02-11 NOTE — Assessment & Plan Note (Signed)
Deteriorated. Explained that benzos are habit forming and since she is not having panic attacks, I would not suggest a prn medication.  I did suggest either another SSRI or wellbutrin.   She will think about it and get back to me.

## 2014-02-11 NOTE — Patient Instructions (Signed)
It was nice to meet you. We will call you with your xray results and your ENT referral. Try the exercises in your handout and update me with your symptoms.

## 2014-02-11 NOTE — Assessment & Plan Note (Signed)
New- persistent. Exam unremarkable.  Given duration of symptoms, will refer to ENT for further evaluation. Could be anxiety related although difficult to assess.

## 2014-02-16 ENCOUNTER — Encounter: Payer: Self-pay | Admitting: Family Medicine

## 2014-04-02 ENCOUNTER — Encounter: Payer: Self-pay | Admitting: Family Medicine

## 2014-04-02 ENCOUNTER — Ambulatory Visit (INDEPENDENT_AMBULATORY_CARE_PROVIDER_SITE_OTHER): Payer: BC Managed Care – PPO | Admitting: Family Medicine

## 2014-04-02 VITALS — BP 114/72 | HR 96 | Temp 98.2°F | Wt 154.5 lb

## 2014-04-02 DIAGNOSIS — L918 Other hypertrophic disorders of the skin: Secondary | ICD-10-CM | POA: Insufficient documentation

## 2014-04-02 NOTE — Addendum Note (Signed)
Addended by: Baldomero LamyHAVERS, Cynthya Yam C on: 04/02/2014 01:31 PM   Modules accepted: Orders

## 2014-04-02 NOTE — Progress Notes (Signed)
Pre visit review using our clinic review tool, if applicable. No additional management support is needed unless otherwise documented below in the visit note. 

## 2014-04-02 NOTE — Progress Notes (Signed)
S: The patient complains of symptomatic skin tag under her right axilla.  It has been irritated by clothing, jewelry and rubbing.  Current Outpatient Prescriptions on File Prior to Visit  Medication Sig Dispense Refill  . ibuprofen (ADVIL,MOTRIN) 600 MG tablet Take 1 tablet (600 mg total) by mouth every 6 (six) hours as needed for pain. 30 tablet 2   No current facility-administered medications on file prior to visit.    No Known Allergies  Past Medical History  Diagnosis Date  . Monilial vaginitis 06/12/2003  . H/O urinary frequency 09/22/2002  . H/O chlamydia infection   . History of varicella   . H/O rubella   . Yeast infection   . Abnormal Pap smear 2002  . Depression 2008  . Hx: UTI (urinary tract infection) 04/2009  . Polyhydramnios in second trimester   . NSVD (normal spontaneous vaginal delivery) 12/31/2011    2227    Past Surgical History  Procedure Laterality Date  . Wisdom tooth extraction      Family History  Problem Relation Age of Onset  . Diabetes Father   . Hypertension Maternal Aunt   . Hypertension Maternal Grandmother   . Hypertension Maternal Grandfather   . Diabetes Maternal Grandfather   . Cancer Maternal Grandfather   . Diabetes Paternal Grandmother   . Anesthesia problems Neg Hx     History   Social History  . Marital Status: Married    Spouse Name: N/A    Number of Children: N/A  . Years of Education: N/A   Occupational History  . Not on file.   Social History Main Topics  . Smoking status: Never Smoker   . Smokeless tobacco: Never Used  . Alcohol Use: Yes  . Drug Use: No  . Sexual Activity:    Partners: Male    Birth Control/ Protection: None   Other Topics Concern  . Not on file   Social History Narrative   The PMH, PSH, Social History, Family History, Medications, and allergies [facility], and have been updated if relevant.   O:  BP 114/72 mmHg  Pulse 96  Temp(Src) 98.2 F (36.8 C) (Oral)  Wt 154 lb 8  oz (70.081 kg)  SpO2 99%  LMP 03/14/2014  Patient appears well. Large benign appearing skin tag under right axilla, mildly erythematous  A: Skin tag- inflammed  P: Skin tag is snipped off using Betadine for cleansing and sterile iris scissors. Local anesthesia was  used. It was sent for pathology given size and associated inflammation. The patient indicates understanding of these issues and agrees with the plan.

## 2014-05-19 ENCOUNTER — Ambulatory Visit: Payer: BC Managed Care – PPO | Admitting: Family Medicine

## 2015-03-09 ENCOUNTER — Encounter: Payer: Self-pay | Admitting: Family Medicine

## 2015-03-09 ENCOUNTER — Encounter (INDEPENDENT_AMBULATORY_CARE_PROVIDER_SITE_OTHER): Payer: Self-pay

## 2015-03-09 ENCOUNTER — Ambulatory Visit (INDEPENDENT_AMBULATORY_CARE_PROVIDER_SITE_OTHER): Payer: BLUE CROSS/BLUE SHIELD | Admitting: Family Medicine

## 2015-03-09 VITALS — BP 116/72 | HR 66 | Temp 98.0°F | Ht 63.0 in | Wt 144.8 lb

## 2015-03-09 DIAGNOSIS — Z0001 Encounter for general adult medical examination with abnormal findings: Secondary | ICD-10-CM | POA: Insufficient documentation

## 2015-03-09 DIAGNOSIS — M67471 Ganglion, right ankle and foot: Secondary | ICD-10-CM | POA: Diagnosis not present

## 2015-03-09 DIAGNOSIS — Z0000 Encounter for general adult medical examination without abnormal findings: Secondary | ICD-10-CM | POA: Diagnosis not present

## 2015-03-09 LAB — CBC WITH DIFFERENTIAL/PLATELET
BASOS PCT: 0.6 % (ref 0.0–3.0)
Basophils Absolute: 0 10*3/uL (ref 0.0–0.1)
EOS ABS: 0.5 10*3/uL (ref 0.0–0.7)
EOS PCT: 8.1 % — AB (ref 0.0–5.0)
HCT: 40.2 % (ref 36.0–46.0)
Hemoglobin: 13.1 g/dL (ref 12.0–15.0)
LYMPHS ABS: 2.5 10*3/uL (ref 0.7–4.0)
Lymphocytes Relative: 41.1 % (ref 12.0–46.0)
MCHC: 32.7 g/dL (ref 30.0–36.0)
MCV: 88.6 fl (ref 78.0–100.0)
MONO ABS: 0.5 10*3/uL (ref 0.1–1.0)
Monocytes Relative: 8 % (ref 3.0–12.0)
NEUTROS PCT: 42.2 % — AB (ref 43.0–77.0)
Neutro Abs: 2.6 10*3/uL (ref 1.4–7.7)
Platelets: 271 10*3/uL (ref 150.0–400.0)
RBC: 4.53 Mil/uL (ref 3.87–5.11)
RDW: 13.6 % (ref 11.5–15.5)
WBC: 6.1 10*3/uL (ref 4.0–10.5)

## 2015-03-09 LAB — COMPREHENSIVE METABOLIC PANEL
ALK PHOS: 31 U/L — AB (ref 39–117)
ALT: 10 U/L (ref 0–35)
AST: 15 U/L (ref 0–37)
Albumin: 4.4 g/dL (ref 3.5–5.2)
BILIRUBIN TOTAL: 0.5 mg/dL (ref 0.2–1.2)
BUN: 6 mg/dL (ref 6–23)
CALCIUM: 9.6 mg/dL (ref 8.4–10.5)
CO2: 26 mEq/L (ref 19–32)
Chloride: 104 mEq/L (ref 96–112)
Creatinine, Ser: 0.76 mg/dL (ref 0.40–1.20)
GFR: 111.69 mL/min (ref >60.00)
Glucose, Bld: 81 mg/dL (ref 70–99)
Potassium: 3.6 mEq/L (ref 3.5–5.1)
SODIUM: 138 meq/L (ref 135–145)
Total Protein: 7.7 g/dL (ref 6.0–8.3)

## 2015-03-09 LAB — LIPID PANEL
Cholesterol: 149 mg/dL (ref 0–200)
HDL: 41.1 mg/dL (ref >39.00)
LDL Cholesterol: 95 mg/dL (ref 0–99)
NONHDL: 107.96
TRIGLYCERIDES: 63 mg/dL (ref 0.0–149.0)
Total CHOL/HDL Ratio: 4
VLDL: 12.6 mg/dL (ref 0.0–40.0)

## 2015-03-09 LAB — TSH: TSH: 2.19 u[IU]/mL (ref 0.35–4.50)

## 2015-03-09 NOTE — Assessment & Plan Note (Signed)
New- discussed tx options.  She would like to observe for now- see AVS

## 2015-03-09 NOTE — Patient Instructions (Signed)
Good to see you. We will call you with your lab results and you can see them online.  Get a massage!!!!  Ganglion Cyst A ganglion cyst is a noncancerous, fluid-filled lump that occurs near joints or tendons. The ganglion cyst grows out of a joint or the lining of a tendon. It most often develops in the hand or wrist, but it can also develop in the shoulder, elbow, hip, knee, ankle, or foot. The round or oval ganglion cyst can be the size of a pea or larger than a grape. Increased activity may enlarge the size of the cyst because more fluid starts to build up.  CAUSES It is not known what causes a ganglion cyst to grow. However, it may be related to:  Inflammation or irritation around the joint.  An injury.  Repetitive movements or overuse.  Arthritis. RISK FACTORS Risk factors include:  Being a woman.  Being age 34-50. SIGNS AND SYMPTOMS Symptoms may include:   A lump. This most often appears on the hand or wrist, but it can occur in other areas of the body.  Tingling.  Pain.  Numbness.  Muscle weakness.  Weak grip.  Less movement in a joint. DIAGNOSIS Ganglion cysts are most often diagnosed based on a physical exam. Your health care provider will feel the lump and may shine a light alongside it. If it is a ganglion cyst, a light often shines through it. Your health care provider may order an X-ray, ultrasound, or MRI to rule out other conditions. TREATMENT Ganglion cysts usually go away on their own without treatment. If pain or other symptoms are involved, treatment may be needed. Treatment is also needed if the ganglion cyst limits your movement or if it gets infected. Treatment may include:  Wearing a brace or splint on your wrist or finger.  Taking anti-inflammatory medicine.  Draining fluid from the lump with a needle (aspiration).  Injecting a steroid into the joint.  Surgery to remove the ganglion cyst. HOME CARE INSTRUCTIONS  Do not press on the  ganglion cyst, poke it with a needle, or hit it.  Take medicines only as directed by your health care provider.  Wear your brace or splint as directed by your health care provider.  Watch your ganglion cyst for any changes.  Keep all follow-up visits as directed by your health care provider. This is important. SEEK MEDICAL CARE IF:  Your ganglion cyst becomes larger or more painful.  You have increased redness, red streaks, or swelling.  You have pus coming from the lump.  You have weakness or numbness in the affected area.  You have a fever or chills.   This information is not intended to replace advice given to you by your health care provider. Make sure you discuss any questions you have with your health care provider.   Document Released: 03/31/2000 Document Revised: 04/24/2014 Document Reviewed: 09/16/2013 Elsevier Interactive Patient Education [employer]2016 Elsevier Inc.

## 2015-03-09 NOTE — Assessment & Plan Note (Signed)
Reviewed preventive care protocols, scheduled due services, and updated immunizations Discussed nutrition, exercise, diet, and healthy lifestyle.  Orders Placed This Encounter  Procedures  . CBC with Differential/Platelet  . Comprehensive metabolic panel  . Lipid panel  . TSH     

## 2015-03-09 NOTE — Progress Notes (Signed)
Subjective:   Patient ID: Danielle Maxwell, female    DOB: Oct 12, 1980, 34 y.o.   MRN: [id number]  Danielle Maxwell is a pleasant 34 y.o. year old female who presents to clinic today with Annual Exam; Ganglion Cyst; and Headache  on 03/09/2015  HPI:  Well woman- has OBGYN. Pap smear in 11/2014.  Bump on right hand- noticed it about a month ago.  Sometimes painful with movement at work. 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 Not getting larger.  Not currently painful.  HAs- notices them when the back of her neck gets tight.  No photophobia.  No blurred vision.  No nausea or vomiting.  Current Outpatient Prescriptions on File Prior to Visit  Medication Sig Dispense Refill  . ibuprofen (ADVIL,MOTRIN) 600 MG tablet Take 1 tablet (600 mg total) by mouth every 6 (six) hours as needed for pain. 30 tablet 2   No current facility-administered medications on file prior to visit.    No Known Allergies  Past Medical History  Diagnosis Date  . Monilial vaginitis 06/12/2003  . H/O urinary frequency 09/22/2002  . H/O chlamydia infection   . History of varicella   . H/O rubella   . Yeast infection   . Abnormal Pap smear 2002  . Depression 2008  . Hx: UTI (urinary tract infection) 04/2009  . Polyhydramnios in second trimester   . NSVD (normal spontaneous vaginal delivery) 12/31/2011    2227    Past Surgical History  Procedure Laterality Date  . Wisdom tooth extraction      Family History  Problem Relation Age of Onset  . Diabetes Father   . Hypertension Maternal Aunt   . Hypertension Maternal Grandmother   . Hypertension Maternal Grandfather   . Diabetes Maternal Grandfather   . Cancer Maternal Grandfather   . Diabetes Paternal Grandmother   . Anesthesia problems Neg Hx     Social History   Social History  . Marital Status: Married    Spouse Name: N/A  . Number of Children: N/A  . Years of Education: N/A   Occupational History  . Not on file.   Social  History Main Topics  . Smoking status: Never Smoker   . Smokeless tobacco: Never Used  . Alcohol Use: Yes  . Drug Use: No  . Sexual Activity:    Partners: Male    Birth Control/ Protection: None   Other Topics Concern  . Not on file   Social History Narrative   The PMH, PSH, Social History, Family History, Medications, and allergies have been reviewed in [facility], and have been updated if relevant.      Review of Systems  Constitutional: Negative.   Eyes: Negative.   Respiratory: Negative.   Cardiovascular: Negative.   Gastrointestinal: Negative.   Endocrine: Negative.   Genitourinary: Negative.   Musculoskeletal: Negative.   Skin: Negative.   Allergic/Immunologic: Negative.   Neurological: Positive for headaches. Negative for dizziness, tremors, seizures, syncope, facial asymmetry, speech difficulty, weakness, light-headedness and numbness.  Hematological: Negative.   Psychiatric/Behavioral: Negative.   All other systems reviewed and are negative.      Objective:    BP 116/72 mmHg  Pulse 66  Temp(Src) 98 F (36.7 C) (Oral)  Ht  (1.6 m)  Wt 144 lb 12 oz (65.658 kg)  BMI 25.65 kg/m2  SpO2 97%  LMP 02/18/2015   Physical Exam  Constitutional: She is oriented to person, place, and time. She appears  well-developed and well-nourished. No distress.  HENT:  Head: Normocephalic.  Eyes: Conjunctivae are normal.  Neck: Normal range of motion.  +  Trapezius spasm  Cardiovascular: Normal rate and regular rhythm.   Pulmonary/Chest: Effort normal and breath sounds normal.  Abdominal: Soft. Bowel sounds are normal.  Musculoskeletal:  Freely moveable cyst on right dorsal wrist, NTTP, no erythema or warmth  Neurological: She is alert and oriented to person, place, and time. No cranial nerve deficit.  Skin: Skin is warm and dry.  Psychiatric: She has a normal mood and affect. Her behavior is normal. Judgment and thought content normal.  Nursing note and vitals  reviewed.         Assessment & Plan:   Well woman exam (no gynecological exam) - Plan: CBC with Differential/Platelet, Comprehensive metabolic panel, Lipid panel, TSH  Ganglion cyst of right foot No Follow-up on file.

## 2015-03-09 NOTE — Progress Notes (Signed)
Pre visit review using our clinic review tool, if applicable. No additional management support is needed unless otherwise documented below in the visit note. 

## 2015-03-10 ENCOUNTER — Encounter: Payer: Self-pay | Admitting: *Deleted

## 2015-07-19 ENCOUNTER — Ambulatory Visit (INDEPENDENT_AMBULATORY_CARE_PROVIDER_SITE_OTHER): Payer: BLUE CROSS/BLUE SHIELD | Admitting: Primary Care

## 2015-07-19 ENCOUNTER — Encounter: Payer: Self-pay | Admitting: Primary Care

## 2015-07-19 VITALS — BP 118/74 | HR 78 | Temp 98.5°F | Ht 63.0 in | Wt 149.8 lb

## 2015-07-19 DIAGNOSIS — M542 Cervicalgia: Secondary | ICD-10-CM | POA: Diagnosis not present

## 2015-07-19 NOTE — Progress Notes (Signed)
Pre visit review using our clinic review tool, if applicable. No additional management support is needed unless otherwise documented below in the visit note. 

## 2015-07-19 NOTE — Patient Instructions (Signed)
For neck pain and pressure start Ibuprofen 600 mg tablets three times daily as needed.   Try applying heat to your neck 3-4 times daily for 20 minute intervals.  Please notify me if you develop fevers of 101, start to feel worse, develop a cough with green sputum, or feel worse by Monday April 10th.  Ensure you are staying hydrated with water.  It was a pleasure meeting you!

## 2015-07-19 NOTE — Progress Notes (Signed)
Subjective:    Patient ID: Danielle Maxwell, female    DOB: September 28, 1980, 35 y.o.   MRN: [id number]  HPI  [name] is a 35 year old female who presents today with a chief complaint of neck pain. She also reports fever (101.4 yesterday), headache, fatigue, sore throat, ear aches, chills, body aches. Her symptoms have been present since Saturday. Denies fevers today, cough, recent injury or trauma, neck stiffness, sick contacts. She took tylenol this morning. Last night she took theraflu and sinus cold medication with temporary improvement.   Review of Systems  Constitutional: Positive for fever and fatigue.  HENT: Positive for congestion, ear pain and sinus pressure.   Respiratory: Positive for shortness of breath. Negative for cough.   Musculoskeletal: Positive for neck pain.  Neurological: Positive for headaches.       Past Medical History  Diagnosis Date  . Monilial vaginitis 06/12/2003  . H/O urinary frequency 09/22/2002  . H/O chlamydia infection   . History of varicella   . H/O rubella   . Yeast infection   . Abnormal Pap smear 2002  . Depression 2008  . Hx: UTI (urinary tract infection) 04/2009  . Polyhydramnios in second trimester   . NSVD (normal spontaneous vaginal delivery) 12/31/2011    2227    Social History   Social History  . Marital Status: Married    Spouse Name: N/A  . Number of Children: N/A  . Years of Education: N/A   Occupational History  . Not on file.   Social History Main Topics  . Smoking status: Never Smoker   . Smokeless tobacco: Never Used  . Alcohol Use: Yes  . Drug Use: No  . Sexual Activity:    Partners: Male    Birth Control/ Protection: None   Other Topics Concern  . Not on file   Social History Narrative    Past Surgical History  Procedure Laterality Date  . Wisdom tooth extraction      Family History  Problem Relation Age of Onset  . Diabetes Father   . Hypertension Maternal Aunt   . Hypertension Maternal Grandmother   .  Hypertension Maternal Grandfather   . Diabetes Maternal Grandfather   . Cancer Maternal Grandfather   . Diabetes Paternal Grandmother   . Anesthesia problems Neg Hx     No Known Allergies  Current Outpatient Prescriptions on File Prior to Visit  Medication Sig Dispense Refill  . ibuprofen (ADVIL,MOTRIN) 600 MG tablet Take 1 tablet (600 mg total) by mouth every 6 (six) hours as needed for pain. 30 tablet 2   No current facility-administered medications on file prior to visit.    BP 118/74 mmHg  Pulse 78  Temp(Src) 98.5 F (36.9 C) (Oral)  Ht  (1.6 m)  Wt 149 lb 12.8 oz (67.949 kg)  BMI 26.54 kg/m2  SpO2 99%  LMP 06/24/2015    Objective:   Physical Exam  Constitutional: She appears well-nourished.  HENT:  Right Ear: Tympanic membrane and ear canal normal.  Left Ear: Tympanic membrane and ear canal normal.  Nose: Right sinus exhibits no maxillary sinus tenderness and no frontal sinus tenderness. Left sinus exhibits no maxillary sinus tenderness and no frontal sinus tenderness.  Mouth/Throat: Oropharynx is clear and moist.  Eyes: Conjunctivae are normal.  Neck: Normal range of motion. Neck supple. No rigidity. No Kernig's sign noted.  Cardiovascular: Normal rate and regular rhythm.   Pulmonary/Chest: Effort normal and breath sounds normal. She has no  wheezes. She has no rales.  Lymphadenopathy:    She has no cervical adenopathy.  Skin: Skin is warm and dry.          Assessment & Plan:  URI without cough:  Neck pain, fatigue, body aches, sore throat x 2 days. Some improvement with OTC treatment. Exam with clear lungs, negative kernigs sign, unremarkable HENT exam. Does not appear acutely ill. Suspect early viral URI as she has no evidence of bacterial involvement. Ibuprofen, fluids, heat application, rest. Return precautions provided.

## 2015-12-15 DIAGNOSIS — Z01419 Encounter for gynecological examination (general) (routine) without abnormal findings: Secondary | ICD-10-CM | POA: Diagnosis not present

## 2016-01-21 DIAGNOSIS — H5213 Myopia, bilateral: Secondary | ICD-10-CM | POA: Diagnosis not present

## 2016-02-25 DIAGNOSIS — Z23 Encounter for immunization: Secondary | ICD-10-CM | POA: Diagnosis not present

## 2016-11-03 ENCOUNTER — Ambulatory Visit (INDEPENDENT_AMBULATORY_CARE_PROVIDER_SITE_OTHER): Payer: BLUE CROSS/BLUE SHIELD | Admitting: Family Medicine

## 2016-11-03 ENCOUNTER — Encounter: Payer: Self-pay | Admitting: Family Medicine

## 2016-11-03 DIAGNOSIS — J0100 Acute maxillary sinusitis, unspecified: Secondary | ICD-10-CM

## 2016-11-03 MED ORDER — FLUTICASONE PROPIONATE 50 MCG/ACT NA SUSP: 2.0000 | Freq: Every day | NASAL | 0 refills | Status: DC

## 2016-11-03 MED ORDER — AMOXICILLIN-POT CLAVULANATE 875-125 MG PO TABS: 1.0000 | ORAL_TABLET | Freq: Two times a day (BID) | ORAL | 0 refills | Status: DC

## 2016-11-03 NOTE — Patient Instructions (Signed)
Start augmentin, use floanse if needed.   Ibuprofen with food.  Rest and fluids.  Warm compresses on the face.   Take care.  Glad to see you.

## 2016-11-03 NOTE — Progress Notes (Signed)
Sx started about 2 weeks ago.  Started with ST initially.  She attributed it to allergies initially, then worse in the meantime.  Head is stuffy.  Facial pain ear pain.  L upper tooth pain.  Fever last week.  Took theraflu in the meantime.  Hearing is muffled.  Some cough, dry.  No vomiting, no diarrhea.    LMP this month, ie not pregnant.   Meds, vitals, and allergies reviewed.   ROS: Per HPI unless specifically indicated in ROS section   GEN: nad, alert and oriented HEENT: mucous membranes moist, tm w/o erythema, nasal exam w/o erythema, clear discharge noted,  OP with cobblestoning, L max sinus ttp NECK: supple w/o LA CV: rrr.   PULM: ctab, no inc wob EXT: no edema

## 2016-11-05 DIAGNOSIS — J0100 Acute maxillary sinusitis, unspecified: Secondary | ICD-10-CM | POA: Insufficient documentation

## 2016-11-05 NOTE — Assessment & Plan Note (Signed)
Start augmentin, use floanse if needed.   Ibuprofen with food.  Rest and fluids.  Warm compresses on the face.   Nontoxic. Update us as needed. She agrees.

## 2017-01-04 DIAGNOSIS — N920 Excessive and frequent menstruation with regular cycle: Secondary | ICD-10-CM | POA: Diagnosis not present

## 2017-01-04 DIAGNOSIS — Z01419 Encounter for gynecological examination (general) (routine) without abnormal findings: Secondary | ICD-10-CM | POA: Diagnosis not present

## 2017-01-04 DIAGNOSIS — Z6826 Body mass index (BMI) 26.0-26.9, adult: Secondary | ICD-10-CM | POA: Diagnosis not present

## 2017-01-04 DIAGNOSIS — R102 Pelvic and perineal pain: Secondary | ICD-10-CM | POA: Diagnosis not present

## 2017-01-22 ENCOUNTER — Other Ambulatory Visit: Payer: Self-pay | Admitting: Obstetrics and Gynecology

## 2017-01-22 DIAGNOSIS — N920 Excessive and frequent menstruation with regular cycle: Secondary | ICD-10-CM | POA: Diagnosis not present

## 2017-05-03 DIAGNOSIS — Z3040 Encounter for surveillance of contraceptives, unspecified: Secondary | ICD-10-CM | POA: Diagnosis not present

## 2017-10-23 ENCOUNTER — Encounter: Payer: Self-pay | Admitting: Family Medicine

## 2017-10-23 ENCOUNTER — Ambulatory Visit: Payer: BLUE CROSS/BLUE SHIELD | Admitting: Family Medicine

## 2017-10-23 DIAGNOSIS — G43909 Migraine, unspecified, not intractable, without status migrainosus: Secondary | ICD-10-CM

## 2017-10-23 MED ORDER — PROMETHAZINE HCL 25 MG PO TABS: 25.0000 mg | ORAL_TABLET | Freq: Three times a day (TID) | ORAL | 0 refills | Status: DC | PRN

## 2017-10-23 MED ORDER — SUMATRIPTAN SUCCINATE 100 MG PO TABS: 100.0000 mg | ORAL_TABLET | ORAL | 0 refills | Status: DC | PRN

## 2017-10-23 NOTE — Progress Notes (Signed)
Pt will f/u with Allayne GitelmanK Clark later this month.    Headache since PM of 10/18/17.  Felt like a sinus HA initially.  Took exceedrin and benadryl initially.  Some mild improvement with ibuprofen but not resolved.  Continues for about 6 days.  Photophobia until today.  Phonophobia recently.  H/o migraines but this is worse than typical.  Throbbing pain.  Some nausea. She drove here.    She had prev made diet changes to help with migraines.  She used imitrex in the past with relief of HA, years ago.  No ADE on med.    Meds, vitals, and allergies reviewed.   ROS: Per HPI unless specifically indicated in ROS section   GEN: nad, alert and oriented HEENT: mucous membranes moist, TM wnl, nasal and OP exam wnl NECK: supple w/o LA CV: rrr.  PULM: ctab, no inc wob ABD: soft, +bs EXT: no edema SKIN: no acute rash CN 2-12 wnl B, S/S/DTR wnl x4

## 2017-10-23 NOTE — Patient Instructions (Signed)
Use imitrex if needed for pain.  Max 2 doses in 24 hours.  Use phenergan for nausea.  Sedation caution.  Out of work for now.  Rest and fluids.  Take care.  Glad to see you.

## 2017-10-24 DIAGNOSIS — G43909 Migraine, unspecified, not intractable, without status migrainosus: Secondary | ICD-10-CM | POA: Insufficient documentation

## 2017-10-24 NOTE — Assessment & Plan Note (Addendum)
Still okay for outpatient follow-up.  Discussed with patient about options. Use imitrex if needed for pain.  Max 2 doses in 24 hours.  Use phenergan for nausea.  Sedation caution.  Out of work for now.  Rest and fluids.  She agrees.   Update us as needed.

## 2017-11-14 ENCOUNTER — Encounter: Payer: Self-pay | Admitting: Primary Care

## 2017-11-14 ENCOUNTER — Ambulatory Visit: Payer: BLUE CROSS/BLUE SHIELD | Admitting: Primary Care

## 2017-11-14 VITALS — BP 108/72 | HR 73 | Temp 98.3°F | Ht 63.0 in | Wt 142.0 lb

## 2017-11-14 DIAGNOSIS — G43901 Migraine, unspecified, not intractable, with status migrainosus: Secondary | ICD-10-CM

## 2017-11-14 MED ORDER — BUTALBITAL-APAP-CAFFEINE 50-325-40 MG PO TABS: 1.0000 | ORAL_TABLET | Freq: Three times a day (TID) | ORAL | 0 refills | Status: AC | PRN

## 2017-11-14 MED ORDER — METHYLPREDNISOLONE ACETATE 80 MG/ML IJ SUSP
80.0000 mg | Freq: Once | INTRAMUSCULAR | Status: AC
  Administered 2017-11-14: 80 mg via INTRAMUSCULAR

## 2017-11-14 NOTE — Addendum Note (Signed)
Addended by: Tawnya CrookSAMBATH, Norelle Runnion on: 11/14/2017 11:07 AM   Modules accepted: Orders

## 2017-11-14 NOTE — Assessment & Plan Note (Signed)
Chronic and infrequent for years, current one since 10/18/17. No resolve with OTC and Imitrex. IM Depo Medrol 80 mg provided today. Rx for Fioricet provided. Discussed to use sparingly.   She will update in 24 hours if no improvement or resolve. We will need to consider neurology evaluation vs daily preventative treatment, especially if headaches return.  Neuro exam unremarkable today.

## 2017-11-14 NOTE — Patient Instructions (Addendum)
You were provided with an injection of steroids for migraine abortion.   Stop the Imitrex for now. Try Fioricet for migraines/severe headaches. Start by taking 1 tablet up to 3 times daily as needed. Use this sparingly.    Please notify me if no resolve in your headache, and/or if your headaches persist.   It was a pleasure to see you today!

## 2017-11-14 NOTE — Progress Notes (Signed)
Subjective:    Patient ID: Danielle Maxwell, female    DOB: Mar 31, 1981, 37 y.o.   MRN: [id number]  HPI  Danielle Maxwell is a 37 year old female who presents today to transfer care from Dr. Dayton MartesAron.  1) Migraines/Headaches: Chronic since 2010, intermittent and infrequent overall. Currently managed on Excedrin Migraine, sumatriptan 100 mg. Also managed on promethazine for nausea. Recent evaluation on 10/24/17 with Dr. Para Marchuncan. Presented with headache since 10/18/17 located to sinuses with photophobia and nausea. She was provided with a prescription for Imitrex and Phenergan.   Since her visit with Dr. Para Marchuncan she saw her optometrist. She had recently seen her optometrist, prior to her visit with Dr. Para Marchuncan, who increased the strength of her glasses prescription. During her recent optometry visit they decreased her prescription back and she has noticed some improvement.   She will wake up with headaches daily, located to the frontal lobes, behind left eye mostly. She describes her symptoms as a pulsating pain/pressure behind her left eye. Her vision does seem "off". She's been taking Imitrex some improvement, no resolve. Her last dose being yesterday.  She will take Excedrin Migraine or Benadryl OTC most days with a dulling of her pain.   Review of Systems  Constitutional: Negative for fever.  HENT: Negative for congestion and sinus pressure.   Eyes: Positive for photophobia and visual disturbance.  Respiratory: Negative for cough.   Gastrointestinal: Negative for nausea.  Neurological: Positive for headaches. Negative for dizziness.       Past Medical History:  Diagnosis Date  . Abnormal Pap smear 2002  . Depression 2008  . H/O chlamydia infection   . H/O rubella   . H/O urinary frequency 09/22/2002  . History of varicella   . Hx: UTI (urinary tract infection) 04/2009  . Monilial vaginitis 06/12/2003  . NSVD (normal spontaneous vaginal delivery) 12/31/2011   2227  . Polyhydramnios in second trimester    . Yeast infection      Social History   Socioeconomic History  . Marital status: Married    Spouse name: Not on file  . Number of children: Not on file  . Years of education: Not on file  . Highest education level: Not on file  Occupational History  . Not on file  Social Needs  . Financial resource strain: Not on file  . Food insecurity:    Worry: Not on file    Inability: Not on file  . Transportation needs:    Medical: Not on file    Non-medical: Not on file  Tobacco Use  . Smoking status: Never Smoker  . Smokeless tobacco: Never Used  Substance and Sexual Activity  . Alcohol use: Yes  . Drug use: No  . Sexual activity: Yes    Partners: Male    Birth control/protection: None  Lifestyle  . Physical activity:    Days per week: Not on file    Minutes per session: Not on file  . Stress: Not on file  Relationships  . Social connections:    Talks on phone: Not on file    Gets together: Not on file    Attends religious service: Not on file    Active member of club or organization: Not on file    Attends meetings of clubs or organizations: Not on file    Relationship status: Not on file  . Intimate partner violence:    Fear of current or ex partner: Not on file    Emotionally  abused: Not on file    Physically abused: Not on file    Forced sexual activity: Not on file  Other Topics Concern  . Not on file  Social History Narrative   Married.   2 children.   Works at [employer].   Enjoys reading.     Past Surgical History:  Procedure Laterality Date  . WISDOM TOOTH EXTRACTION      Family History  Problem Relation Age of Onset  . Diabetes Father   . Hypertension Maternal Aunt   . Hypertension Maternal Grandmother   . Hypertension Maternal Grandfather   . Diabetes Maternal Grandfather   . Prostate cancer Maternal Grandfather   . Diabetes Paternal Grandmother   . Anesthesia problems Neg Hx     No Known Allergies  Current Outpatient Medications on File Prior  to Visit  Medication Sig Dispense Refill  . diphenhydrAMINE (BENADRYL) 25 MG tablet Take 25 mg by mouth every 6 (six) hours as needed.    . fluticasone (FLONASE) 50 MCG/ACT nasal spray Place 2 sprays into both nostrils daily. 16 g 0  . ibuprofen (ADVIL,MOTRIN) 600 MG tablet Take 1 tablet (600 mg total) by mouth every 6 (six) hours as needed for pain. 30 tablet 2  . SUMAtriptan (IMITREX) 100 MG tablet Take 1 tablet (100 mg total) by mouth every 2 (two) hours as needed for migraine (max 2 doses in 24 hours). 10 tablet 0  . aspirin-acetaminophen-caffeine (EXCEDRIN MIGRAINE) 250-250-65 MG tablet Take by mouth every 6 (six) hours as needed for headache.    . promethazine (PHENERGAN) 25 MG tablet Take 1 tablet (25 mg total) by mouth every 8 (eight) hours as needed for nausea or vomiting (sedation caution). (Patient not taking: Reported on 11/14/2017) 20 tablet 0   No current facility-administered medications on file prior to visit.     BP 108/72   Pulse 73   Temp 98.3 F (36.8 C) (Oral)   Ht 5\' 3"  (1.6 m)   Wt 142 lb (64.4 kg)   LMP 10/15/2017   SpO2 98%   BMI 25.15 kg/m    Objective:   Physical Exam  Constitutional: She is oriented to person, place, and time. She appears well-nourished.  Eyes: EOM are normal.  Cardiovascular: Normal rate and regular rhythm.  Respiratory: Effort normal and breath sounds normal.  Neurological: She is alert and oriented to person, place, and time. No cranial nerve deficit.  Skin: Skin is warm and dry.           Assessment & Plan:

## 2018-01-08 DIAGNOSIS — Z01419 Encounter for gynecological examination (general) (routine) without abnormal findings: Secondary | ICD-10-CM | POA: Diagnosis not present

## 2018-01-08 DIAGNOSIS — R102 Pelvic and perineal pain: Secondary | ICD-10-CM | POA: Diagnosis not present

## 2018-01-08 DIAGNOSIS — R300 Dysuria: Secondary | ICD-10-CM | POA: Diagnosis not present

## 2018-01-08 DIAGNOSIS — Z124 Encounter for screening for malignant neoplasm of cervix: Secondary | ICD-10-CM | POA: Diagnosis not present

## 2018-01-08 DIAGNOSIS — Z6826 Body mass index (BMI) 26.0-26.9, adult: Secondary | ICD-10-CM | POA: Diagnosis not present

## 2018-01-08 LAB — RESULTS CONSOLE HPV: CHL HPV: NEGATIVE

## 2018-01-08 LAB — HM PAP SMEAR: HM Pap smear: NORMAL

## 2018-01-14 ENCOUNTER — Ambulatory Visit (INDEPENDENT_AMBULATORY_CARE_PROVIDER_SITE_OTHER): Payer: BLUE CROSS/BLUE SHIELD | Admitting: Primary Care

## 2018-01-14 ENCOUNTER — Encounter: Payer: Self-pay | Admitting: Primary Care

## 2018-01-14 VITALS — BP 112/74 | HR 76 | Temp 98.0°F | Ht 63.0 in | Wt 139.0 lb

## 2018-01-14 DIAGNOSIS — F4323 Adjustment disorder with mixed anxiety and depressed mood: Secondary | ICD-10-CM

## 2018-01-14 DIAGNOSIS — G43901 Migraine, unspecified, not intractable, with status migrainosus: Secondary | ICD-10-CM

## 2018-01-14 DIAGNOSIS — R519 Headache, unspecified: Secondary | ICD-10-CM | POA: Insufficient documentation

## 2018-01-14 DIAGNOSIS — Z0000 Encounter for general adult medical examination without abnormal findings: Secondary | ICD-10-CM | POA: Diagnosis not present

## 2018-01-14 DIAGNOSIS — R51 Headache: Secondary | ICD-10-CM | POA: Diagnosis not present

## 2018-01-14 DIAGNOSIS — G4700 Insomnia, unspecified: Secondary | ICD-10-CM | POA: Diagnosis not present

## 2018-01-14 LAB — COMPREHENSIVE METABOLIC PANEL
ALBUMIN: 4 g/dL (ref 3.5–5.2)
ALT: 13 U/L (ref 0–35)
AST: 17 U/L (ref 0–37)
Alkaline Phosphatase: 32 U/L — ABNORMAL LOW (ref 39–117)
BUN: 6 mg/dL (ref 6–23)
CALCIUM: 9.4 mg/dL (ref 8.4–10.5)
CHLORIDE: 103 meq/L (ref 96–112)
CO2: 28 mEq/L (ref 19–32)
CREATININE: 0.8 mg/dL (ref 0.40–1.20)
GFR: 103.58 mL/min (ref >60.00)
Glucose, Bld: 89 mg/dL (ref 70–99)
POTASSIUM: 3.7 meq/L (ref 3.5–5.1)
Sodium: 139 mEq/L (ref 135–145)
Total Bilirubin: 0.3 mg/dL (ref 0.2–1.2)
Total Protein: 7.4 g/dL (ref 6.0–8.3)

## 2018-01-14 LAB — LIPID PANEL
CHOLESTEROL: 177 mg/dL (ref 0–200)
HDL: 47.8 mg/dL (ref >39.00)
LDL Cholesterol: 104 mg/dL — ABNORMAL HIGH (ref 0–99)
NonHDL: 129.06
TRIGLYCERIDES: 124 mg/dL (ref 0.0–149.0)
Total CHOL/HDL Ratio: 4
VLDL: 24.8 mg/dL (ref 0.0–40.0)

## 2018-01-14 MED ORDER — AMITRIPTYLINE HCL 10 MG PO TABS: 10.0000 mg | ORAL_TABLET | Freq: Every day | ORAL | 1 refills | Status: DC

## 2018-01-14 NOTE — Assessment & Plan Note (Signed)
Tetanus UTD, will get influenza vaccination with her daughter. Pap smear UTD. Recommended regular exercise, work on diet.  Exam unremarkable. Labs pending. Follow up in 1 year for CPE.

## 2018-01-14 NOTE — Progress Notes (Signed)
Subjective:    Patient ID: Danielle Maxwell, female    DOB: 02/08/1981, 37 y.o.   MRN: [id number]  HPI  Danielle Maxwell is a 37 year old female who presents today for complete physical.  Insomnia: Long history of since teenage years. Difficulty falling and staying asleep. She wakes at 3:30 am every morning and will fall back asleep around 4:30 am. Also feels like she is restless with mind racing thoughts. She was once managed on Ambien in the past, didn't like the way she felt. She's taking Melatonin intermittently, but does continue to wake up during the night. She denies daily anxiety symptoms, once managed on Zoloft in the past but didn't like the way it made her feel, also didn't help with sleep.   Immunizations: -Tetanus: Completed in 2013 -Influenza: She will get soon   Diet: She endorses a fair diet Breakfast: Cereal, bagel, sometimes skips Lunch: Fast food, sometimes left overs Dinner: Meat, some vegetables, starch Snacks: Occasionally, nuts  Desserts: Several times daily  Beverages: Some water, soda twice weekly, sweet tea mostly  Exercise: She is not exercising regularly. Some walking.  Eye exam: Completed in Summer 2019 Dental exam: Completes semi-annually  Pap Smear: Completed in 2019   Review of Systems  Constitutional: Negative for unexpected weight change.  HENT: Negative for rhinorrhea.   Respiratory: Negative for cough and shortness of breath.   Cardiovascular: Negative for chest pain.  Gastrointestinal: Negative for constipation and diarrhea.  Genitourinary: Negative for difficulty urinating and menstrual problem.  Musculoskeletal: Negative for arthralgias and myalgias.  Skin: Negative for rash.  Allergic/Immunologic: Negative for environmental allergies.  Neurological: Positive for headaches. Negative for dizziness and numbness.       Headaches every other day.   Psychiatric/Behavioral: Positive for sleep disturbance.       See HPI       Past Medical History:   Diagnosis Date  . Abnormal Pap smear 2002  . Depression 2008  . H/O chlamydia infection   . H/O rubella   . H/O urinary frequency 09/22/2002  . History of varicella   . Hx: UTI (urinary tract infection) 04/2009  . Monilial vaginitis 06/12/2003  . NSVD (normal spontaneous vaginal delivery) 12/31/2011   2227  . Polyhydramnios in second trimester   . Yeast infection      Social History   Socioeconomic History  . Marital status: Married    Spouse name: Not on file  . Number of children: Not on file  . Years of education: Not on file  . Highest education level: Not on file  Occupational History  . Not on file  Social Needs  . Financial resource strain: Not on file  . Food insecurity:    Worry: Not on file    Inability: Not on file  . Transportation needs:    Medical: Not on file    Non-medical: Not on file  Tobacco Use  . Smoking status: Never Smoker  . Smokeless tobacco: Never Used  Substance and Sexual Activity  . Alcohol use: Yes  . Drug use: No  . Sexual activity: Yes    Partners: Male    Birth control/protection: None  Lifestyle  . Physical activity:    Days per week: Not on file    Minutes per session: Not on file  . Stress: Not on file  Relationships  . Social connections:    Talks on phone: Not on file    Gets together: Not on file  Attends religious service: Not on file    Active member of club or organization: Not on file    Attends meetings of clubs or organizations: Not on file    Relationship status: Not on file  . Intimate partner violence:    Fear of current or ex partner: Not on file    Emotionally abused: Not on file    Physically abused: Not on file    Forced sexual activity: Not on file  Other Topics Concern  . Not on file  Social History Narrative   Married.   2 children.   Works at [employer].   Enjoys reading.     Past Surgical History:  Procedure Laterality Date  . WISDOM TOOTH EXTRACTION      Family History  Problem Relation Age  of Onset  . Diabetes Father   . Hypertension Maternal Aunt   . Hypertension Maternal Grandmother   . Hypertension Maternal Grandfather   . Diabetes Maternal Grandfather   . Prostate cancer Maternal Grandfather   . Diabetes Paternal Grandmother   . Anesthesia problems Neg Hx     No Known Allergies  Current Outpatient Medications on File Prior to Visit  Medication Sig Dispense Refill  . aspirin-acetaminophen-caffeine (EXCEDRIN MIGRAINE) 250-250-65 MG tablet Take by mouth every 6 (six) hours as needed for headache.    . butalbital-acetaminophen-caffeine (FIORICET, ESGIC) 50-325-40 MG tablet Take 1-2 tablets by mouth every 8 (eight) hours as needed for headache or migraine. 20 tablet 0  . diphenhydrAMINE (BENADRYL) 25 MG tablet Take 25 mg by mouth every 6 (six) hours as needed.    . fluticasone (FLONASE) 50 MCG/ACT nasal spray Place 2 sprays into both nostrils daily. 16 g 0  . ibuprofen (ADVIL,MOTRIN) 600 MG tablet Take 1 tablet (600 mg total) by mouth every 6 (six) hours as needed for pain. 30 tablet 2  . promethazine (PHENERGAN) 25 MG tablet Take 1 tablet (25 mg total) by mouth every 8 (eight) hours as needed for nausea or vomiting (sedation caution). 20 tablet 0  . SUMAtriptan (IMITREX) 100 MG tablet Take 1 tablet (100 mg total) by mouth every 2 (two) hours as needed for migraine (max 2 doses in 24 hours). 10 tablet 0   No current facility-administered medications on file prior to visit.     BP 112/74   Pulse 76   Temp 98 F (36.7 C) (Oral)   Ht 5\' 3"  (1.6 m)   Wt 139 lb (63 kg)   LMP 01/10/2018   SpO2 99%   BMI 24.62 kg/m    Objective:   Physical Exam  Constitutional: She is oriented to person, place, and time. She appears well-nourished.  HENT:  Mouth/Throat: No oropharyngeal exudate.  Eyes: Pupils are equal, round, and reactive to light. EOM are normal.  Neck: Neck supple. No thyromegaly present.  Cardiovascular: Normal rate and regular rhythm.  Respiratory: Effort  normal and breath sounds normal.  GI: Soft. Bowel sounds are normal. There is no tenderness.  Musculoskeletal: Normal range of motion.  Neurological: She is alert and oriented to person, place, and time.  Skin: Skin is warm and dry.  Psychiatric: She has a normal mood and affect.           Assessment & Plan:

## 2018-01-14 NOTE — Assessment & Plan Note (Signed)
Denies concerns for anxiety and depression. See HPI.

## 2018-01-14 NOTE — Assessment & Plan Note (Signed)
Chronic for years. Has failed OTC treatment, couldn't tolerate SSRI. Will be initiating low dose amitriptyline for recurrent headaches, so this may in turn help with sleep. She will update.

## 2018-01-14 NOTE — Assessment & Plan Note (Signed)
Daily to every other day on average. Migraines are less frequent overall. Given frequent headaches, will treat with preventative medication with amitriptyline 10 mg. Discussed potential side effects, she will update in a few weeks.

## 2018-01-14 NOTE — Patient Instructions (Addendum)
Stop by the lab prior to leaving today. I will notify you of your results once received.   Start exercising. You should be getting 150 minutes of moderate intensity exercise weekly.  It's important to improve your diet by reducing consumption of fast food, fried food, processed snack foods, sugary drinks. Increase consumption of fresh vegetables and fruits, whole grains, water.  Ensure you are drinking 64 ounces of water daily.  Start amitriptyline 10 mg tablets for migraine prevention and sleep. Take 1 tablet by mouth at bedtime. Please update me in a few weeks.  We will see you in one year for your annual exam or sooner if needed.  It was a pleasure to see you today!   Food Choices for Gastroesophageal Reflux Disease, Adult When you have gastroesophageal reflux disease (GERD), the foods you eat and your eating habits are very important. Choosing the right foods can help ease your discomfort. What guidelines do I need to follow?  Choose fruits, vegetables, whole grains, and low-fat dairy products.  Choose low-fat meat, fish, and poultry.  Limit fats such as oils, salad dressings, butter, nuts, and avocado.  Keep a food diary. This helps you identify foods that cause symptoms.  Avoid foods that cause symptoms. These may be different for everyone.  Eat small meals often instead of 3 large meals a day.  Eat your meals slowly, in a place where you are relaxed.  Limit fried foods.  Cook foods using methods other than frying.  Avoid drinking alcohol.  Avoid drinking large amounts of liquids with your meals.  Avoid bending over or lying down until 2-3 hours after eating. What foods are not recommended? These are some foods and drinks that may make your symptoms worse: Vegetables Tomatoes. Tomato juice. Tomato and spaghetti sauce. Chili peppers. Onion and garlic. Horseradish. Fruits Oranges, grapefruit, and lemon (fruit and juice). Meats High-fat meats, fish, and poultry.  This includes hot dogs, ribs, ham, sausage, salami, and bacon. Dairy Whole milk and chocolate milk. Sour cream. Cream. Butter. Ice cream. Cream cheese. Drinks Coffee and tea. Bubbly (carbonated) drinks or energy drinks. Condiments Hot sauce. Barbecue sauce. Sweets/Desserts Chocolate and cocoa. Donuts. Peppermint and spearmint. Fats and Oils High-fat foods. This includes [place] fries and potato chips. Other Vinegar. Strong spices. This includes black pepper, white pepper, red pepper, cayenne, curry powder, cloves, ginger, and chili powder. The items listed above may not be a complete list of foods and drinks to avoid. Contact your dietitian for more information. This information is not intended to replace advice given to you by your health care provider. Make sure you discuss any questions you have with your health care provider. Document Released: 10/03/2011 Document Revised: 09/09/2015 Document Reviewed: 02/05/2013 Elsevier Interactive Patient Education  2017 [place] 18-39 Years, Female Preventive care refers to lifestyle choices and visits with your health care provider that can promote health and wellness. What does preventive care include?  A yearly physical exam. This is also called an annual well check.  Dental exams once or twice a year.  Routine eye exams. Ask your health care provider how often you should have your eyes checked.  Personal lifestyle choices, including: ? Daily care of your teeth and gums. ? Regular physical activity. ? Eating a healthy diet. ? Avoiding tobacco and drug use. ? Limiting alcohol use. ? Practicing safe sex. ? Taking vitamin and mineral supplements as recommended by your health care provider. What happens during an annual well check?  The services and screenings done by your health care provider during your annual well check will depend on your age, overall health, lifestyle risk factors, and family history of  disease. Counseling Your health care provider may ask you questions about your:  Alcohol use.  Tobacco use.  Drug use.  Emotional well-being.  Home and relationship well-being.  Sexual activity.  Eating habits.  Work and work Statistician.  Method of birth control.  Menstrual cycle.  Pregnancy history.  Screening You may have the following tests or measurements:  Height, weight, and BMI.  Diabetes screening. This is done by checking your blood sugar (glucose) after you have not eaten for a while (fasting).  Blood pressure.  Lipid and cholesterol levels. These may be checked every 5 years starting at age 17.  Skin check.  Hepatitis C blood test.  Hepatitis B blood test.  Sexually transmitted disease (STD) testing.  BRCA-related cancer screening. This may be done if you have a family history of breast, ovarian, tubal, or peritoneal cancers.  Pelvic exam and Pap test. This may be done every 3 years starting at age 37. Starting at age 18, this may be done every 5 years if you have a Pap test in combination with an HPV test.  Discuss your test results, treatment options, and if necessary, the need for more tests with your health care provider. Vaccines Your health care provider may recommend certain vaccines, such as:  Influenza vaccine. This is recommended every year.  Tetanus, diphtheria, and acellular pertussis (Tdap, Td) vaccine. You may need a Td booster every 10 years.  Varicella vaccine. You may need this if you have not been vaccinated.  HPV vaccine. If you are 5 or younger, you may need three doses over 6 months.  Measles, mumps, and rubella (MMR) vaccine. You may need at least one dose of MMR. You may also need a second dose.  Pneumococcal 13-valent conjugate (PCV13) vaccine. You may need this if you have certain conditions and were not previously vaccinated.  Pneumococcal polysaccharide (PPSV23) vaccine. You may need one or two doses if you smoke  cigarettes or if you have certain conditions.  Meningococcal vaccine. One dose is recommended if you are age 37-21 years and a first-year college student living in a residence hall, or if you have one of several medical conditions. You may also need additional booster doses.  Hepatitis A vaccine. You may need this if you have certain conditions or if you travel or work in places where you may be exposed to hepatitis A.  Hepatitis B vaccine. You may need this if you have certain conditions or if you travel or work in places where you may be exposed to hepatitis B.  Haemophilus influenzae type b (Hib) vaccine. You may need this if you have certain risk factors.  Talk to your health care provider about which screenings and vaccines you need and how often you need them. This information is not intended to replace advice given to you by your health care provider. Make sure you discuss any questions you have with your health care provider. Document Released: 05/30/2001 Document Revised: 12/22/2015 Document Reviewed: 02/02/2015 Elsevier Interactive Patient Education  [employer].

## 2018-01-14 NOTE — Assessment & Plan Note (Addendum)
Overall improved. Using Fioricet once every 2 weeks on average. Continue same.

## 2018-05-24 ENCOUNTER — Other Ambulatory Visit: Payer: Self-pay | Admitting: Primary Care

## 2018-05-24 DIAGNOSIS — R519 Headache, unspecified: Secondary | ICD-10-CM

## 2018-05-24 DIAGNOSIS — R51 Headache: Secondary | ICD-10-CM

## 2018-05-24 DIAGNOSIS — G4700 Insomnia, unspecified: Secondary | ICD-10-CM

## 2018-05-24 NOTE — Telephone Encounter (Signed)
See my chart message

## 2018-05-24 NOTE — Telephone Encounter (Signed)
Last prescribed on 01/14/2018. Last office visit on 01/14/2018. No future appointment

## 2019-01-16 DIAGNOSIS — Z3040 Encounter for surveillance of contraceptives, unspecified: Secondary | ICD-10-CM | POA: Diagnosis not present

## 2019-01-16 DIAGNOSIS — Z01419 Encounter for gynecological examination (general) (routine) without abnormal findings: Secondary | ICD-10-CM | POA: Diagnosis not present

## 2019-01-16 DIAGNOSIS — Z6823 Body mass index (BMI) 23.0-23.9, adult: Secondary | ICD-10-CM | POA: Diagnosis not present

## 2019-02-04 DIAGNOSIS — G4700 Insomnia, unspecified: Secondary | ICD-10-CM

## 2019-02-04 DIAGNOSIS — R519 Headache, unspecified: Secondary | ICD-10-CM

## 2019-02-04 MED ORDER — AMITRIPTYLINE HCL 10 MG PO TABS: 10.0000 mg | ORAL_TABLET | Freq: Every day | ORAL | 0 refills | Status: DC

## 2019-02-04 NOTE — Telephone Encounter (Addendum)
Also received faxed refill request. Last prescribed on 05/27/2018. Last seen on 01/14/2018

## 2019-02-04 NOTE — Telephone Encounter (Signed)
Pt called back and scheduled appt for 03/12/19 for labs 03/20/19 for CPE

## 2019-02-04 NOTE — Telephone Encounter (Signed)
Patient needs CPE office visit for further refills. Will provide 30 day supply for now.  Please schedule.

## 2019-02-04 NOTE — Telephone Encounter (Signed)
lvm asking patient to call office °

## 2019-03-06 ENCOUNTER — Other Ambulatory Visit: Payer: Self-pay | Admitting: Primary Care

## 2019-03-06 DIAGNOSIS — Z0000 Encounter for general adult medical examination without abnormal findings: Secondary | ICD-10-CM

## 2019-03-12 ENCOUNTER — Other Ambulatory Visit (INDEPENDENT_AMBULATORY_CARE_PROVIDER_SITE_OTHER): Payer: BC Managed Care – PPO

## 2019-03-12 DIAGNOSIS — Z0000 Encounter for general adult medical examination without abnormal findings: Secondary | ICD-10-CM

## 2019-03-12 LAB — COMPREHENSIVE METABOLIC PANEL
ALT: 7 U/L (ref 0–35)
AST: 10 U/L (ref 0–37)
Albumin: 3.9 g/dL (ref 3.5–5.2)
Alkaline Phosphatase: 35 U/L — ABNORMAL LOW (ref 39–117)
BUN: 5 mg/dL — ABNORMAL LOW (ref 6–23)
CO2: 27 mEq/L (ref 19–32)
Calcium: 9.2 mg/dL (ref 8.4–10.5)
Chloride: 105 mEq/L (ref 96–112)
Creatinine, Ser: 0.82 mg/dL (ref 0.40–1.20)
GFR: 94.14 mL/min (ref >60.00)
Glucose, Bld: 88 mg/dL (ref 70–99)
Potassium: 3.9 mEq/L (ref 3.5–5.1)
Sodium: 140 mEq/L (ref 135–145)
Total Bilirubin: 0.4 mg/dL (ref 0.2–1.2)
Total Protein: 7.2 g/dL (ref 6.0–8.3)

## 2019-03-12 LAB — LIPID PANEL
Cholesterol: 188 mg/dL (ref 0–200)
HDL: 57.8 mg/dL (ref >39.00)
LDL Cholesterol: 114 mg/dL — ABNORMAL HIGH (ref 0–99)
NonHDL: 130.06
Total CHOL/HDL Ratio: 3
Triglycerides: 82 mg/dL (ref 0.0–149.0)
VLDL: 16.4 mg/dL (ref 0.0–40.0)

## 2019-03-19 ENCOUNTER — Other Ambulatory Visit: Payer: BLUE CROSS/BLUE SHIELD

## 2019-03-20 ENCOUNTER — Other Ambulatory Visit: Payer: Self-pay

## 2019-03-20 ENCOUNTER — Encounter: Payer: Self-pay | Admitting: Primary Care

## 2019-03-20 ENCOUNTER — Ambulatory Visit (INDEPENDENT_AMBULATORY_CARE_PROVIDER_SITE_OTHER): Payer: BC Managed Care – PPO | Admitting: Primary Care

## 2019-03-20 VITALS — BP 118/82 | HR 83 | Temp 97.8°F | Ht 63.0 in | Wt 151.5 lb

## 2019-03-20 DIAGNOSIS — R519 Headache, unspecified: Secondary | ICD-10-CM

## 2019-03-20 DIAGNOSIS — G43901 Migraine, unspecified, not intractable, with status migrainosus: Secondary | ICD-10-CM

## 2019-03-20 DIAGNOSIS — F4323 Adjustment disorder with mixed anxiety and depressed mood: Secondary | ICD-10-CM | POA: Diagnosis not present

## 2019-03-20 DIAGNOSIS — G4700 Insomnia, unspecified: Secondary | ICD-10-CM

## 2019-03-20 DIAGNOSIS — Z0000 Encounter for general adult medical examination without abnormal findings: Secondary | ICD-10-CM | POA: Diagnosis not present

## 2019-03-20 DIAGNOSIS — K219 Gastro-esophageal reflux disease without esophagitis: Secondary | ICD-10-CM

## 2019-03-20 LAB — CBC WITH DIFFERENTIAL/PLATELET
Basophils Absolute: 0 10*3/uL (ref 0.0–0.1)
Basophils Relative: 0.4 % (ref 0.0–3.0)
Eosinophils Absolute: 0.5 10*3/uL (ref 0.0–0.7)
Eosinophils Relative: 9.1 % — ABNORMAL HIGH (ref 0.0–5.0)
HCT: 36.9 % (ref 36.0–46.0)
Hemoglobin: 11.8 g/dL — ABNORMAL LOW (ref 12.0–15.0)
Lymphocytes Relative: 34.4 % (ref 12.0–46.0)
Lymphs Abs: 1.9 10*3/uL (ref 0.7–4.0)
MCHC: 32 g/dL (ref 30.0–36.0)
MCV: 84.3 fl (ref 78.0–100.0)
Monocytes Absolute: 0.6 10*3/uL (ref 0.1–1.0)
Monocytes Relative: 10.9 % (ref 3.0–12.0)
Neutro Abs: 2.4 10*3/uL (ref 1.4–7.7)
Neutrophils Relative %: 45.2 % (ref 43.0–77.0)
Platelets: 281 10*3/uL (ref 150.0–400.0)
RBC: 4.37 Mil/uL (ref 3.87–5.11)
RDW: 20 % — ABNORMAL HIGH (ref 11.5–15.5)
WBC: 5.4 10*3/uL (ref 4.0–10.5)

## 2019-03-20 LAB — LIPASE: Lipase: 18 U/L (ref 11.0–59.0)

## 2019-03-20 MED ORDER — PROMETHAZINE HCL 25 MG PO TABS: 25.0000 mg | ORAL_TABLET | Freq: Three times a day (TID) | ORAL | 0 refills | Status: DC | PRN

## 2019-03-20 MED ORDER — SERTRALINE HCL 25 MG PO TABS: 25.0000 mg | ORAL_TABLET | Freq: Every day | ORAL | 1 refills | Status: DC

## 2019-03-20 MED ORDER — ESCITALOPRAM OXALATE 5 MG PO TABS: 5.0000 mg | ORAL_TABLET | Freq: Every day | ORAL | 1 refills | Status: DC

## 2019-03-20 MED ORDER — ESOMEPRAZOLE MAGNESIUM 40 MG PO CPDR: 40.0000 mg | DELAYED_RELEASE_CAPSULE | Freq: Every day | ORAL | 0 refills | Status: DC

## 2019-03-20 MED ORDER — AMITRIPTYLINE HCL 10 MG PO TABS: 10.0000 mg | ORAL_TABLET | Freq: Every day | ORAL | 3 refills | Status: DC

## 2019-03-20 NOTE — Assessment & Plan Note (Signed)
Suspect triggered by anxiety. Does have breakthrough symptoms on OTC dose with anxiety. Rx for Nexium 40 mg sent to pharmacy. Will also be treating anxiety.  Cbc and Lipase pending. Other labs unremarkable.

## 2019-03-20 NOTE — Assessment & Plan Note (Signed)
Doing well on nightly amitriptyline, continue same.  Refills sent to pharmacy.

## 2019-03-20 NOTE — Assessment & Plan Note (Signed)
Overall doing well on HS amitriptyline, continue same. Refills sent to pharmacy.

## 2019-03-20 NOTE — Assessment & Plan Note (Signed)
Immunizations UTD. Pap smear UTD, follows with GYN. Encouraged regular exercise, healthy diet. Exam today stable. Labs reviewed and pending.

## 2019-03-20 NOTE — Assessment & Plan Note (Signed)
Increased symptoms of anxiety since Covid-19 in March 2020. Suspect GI symptoms are secondary to anxiety but will also investigate.   Discussed options for treatment, she opts for both therapy and medication. Referral placed for therapy. Rx for Zoloft 25 mg sent to pharmacy.   Patient is to take 1/2 tablet daily for 6 days, then advance to 1 full tablet thereafter. We discussed possible side effects of headache, GI upset, drowsiness, and SI/HI. If thoughts of SI/HI develop, we discussed to present to the emergency immediately. Patient verbalized understanding.   Follow up in 6 weeks for re-evaluation.

## 2019-03-20 NOTE — Addendum Note (Signed)
Addended by: Pleas Koch on: 03/20/2019 10:17 AM   Modules accepted: Orders

## 2019-03-20 NOTE — Progress Notes (Addendum)
Subjective:    Patient ID: Danielle Maxwell, female    DOB: January 30, 1981, 38 y.o.   MRN: [id number]  HPI  Danielle Maxwell is a 38 year old female who presents today for complete physical. She would also like to discuss anxiety and GI symptoms.  Chronic anxiety since Covid-19 in March 2020. Symptoms include GI upset, feeling nervous/anxious, worry. She's been taking her promethazine for nausea, also Nexium BID to use for GI upset with some improvement. GAD 7 score of 14 today. She has been on Zoloft in the past, didn't like the way it may her feel. She has not tried anything else.  Chronic epigastric and mid abdominal pain that occurs daily. She describes her pain as "muscle soreness". Occasional esophageal burning on Nexium. She's taken Nexium BID with improvement since August 2020, also has no pain during the weekends, no pain with eating. She does think that her symptoms are partially due to anxiety. Also with constipation with bowel movements once every three days, some nausea.   Immunizations: -Tetanus: Completed in 2013 -Influenza: Completed this season   Diet: She endorses a fair diet. Working on water intake. Exercise: She started hiking during the weekends.   Pap Smear: Completed in 2019  BP Readings from Last 3 Encounters:  03/20/19 118/82  01/14/18 112/74  11/14/17 108/72     Review of Systems  Constitutional: Negative for unexpected weight change.  HENT: Negative for rhinorrhea.   Respiratory: Negative for cough and shortness of breath.   Cardiovascular: Negative for chest pain.  Gastrointestinal: Positive for abdominal pain and constipation. Negative for diarrhea.  Genitourinary: Negative for difficulty urinating and menstrual problem.  Musculoskeletal: Negative for arthralgias and myalgias.  Skin: Negative for rash.  Allergic/Immunologic: Negative for environmental allergies.  Neurological: Negative for dizziness, numbness and headaches.  Psychiatric/Behavioral: The patient  is nervous/anxious.        See HPI       Past Medical History:  Diagnosis Date  . Abnormal Pap smear 2002  . Depression 2008  . H/O chlamydia infection   . H/O rubella   . H/O urinary frequency 09/22/2002  . History of varicella   . Hx: UTI (urinary tract infection) 04/2009  . Monilial vaginitis 06/12/2003  . NSVD (normal spontaneous vaginal delivery) 12/31/2011   2227  . Polyhydramnios in second trimester   . Yeast infection      Social History   Socioeconomic History  . Marital status: Married    Spouse name: Not on file  . Number of children: Not on file  . Years of education: Not on file  . Highest education level: Not on file  Occupational History  . Not on file  Social Needs  . Financial resource strain: Not on file  . Food insecurity    Worry: Not on file    Inability: Not on file  . Transportation needs    Medical: Not on file    Non-medical: Not on file  Tobacco Use  . Smoking status: Never Smoker  . Smokeless tobacco: Never Used  Substance and Sexual Activity  . Alcohol use: Yes  . Drug use: No  . Sexual activity: Yes    Partners: Male    Birth control/protection: None  Lifestyle  . Physical activity    Days per week: Not on file    Minutes per session: Not on file  . Stress: Not on file  Relationships  . Social connections    Talks on phone: Not on file  Gets together: Not on file    Attends religious service: Not on file    Active member of club or organization: Not on file    Attends meetings of clubs or organizations: Not on file    Relationship status: Not on file  . Intimate partner violence    Fear of current or ex partner: Not on file    Emotionally abused: Not on file    Physically abused: Not on file    Forced sexual activity: Not on file  Other Topics Concern  . Not on file  Social History Narrative   Married.   2 children.   Works at [employer].   Enjoys reading.     Past Surgical History:  Procedure Laterality Date  . WISDOM  TOOTH EXTRACTION      Family History  Problem Relation Age of Onset  . Diabetes Father   . Hypertension Maternal Aunt   . Hypertension Maternal Grandmother   . Hypertension Maternal Grandfather   . Diabetes Maternal Grandfather   . Prostate cancer Maternal Grandfather   . Diabetes Paternal Grandmother   . Anesthesia problems Neg Hx     No Known Allergies  Current Outpatient Medications on File Prior to Visit  Medication Sig Dispense Refill  . diphenhydrAMINE (BENADRYL) 25 MG tablet Take 25 mg by mouth every 6 (six) hours as needed.    . fluticasone (FLONASE) 50 MCG/ACT nasal spray Place 2 sprays into both nostrils daily. 16 g 0  . SUMAtriptan (IMITREX) 100 MG tablet Take 1 tablet (100 mg total) by mouth every 2 (two) hours as needed for migraine (max 2 doses in 24 hours). (Patient not taking: Reported on 03/20/2019) 10 tablet 0   No current facility-administered medications on file prior to visit.     BP 118/82   Pulse 83   Temp 97.8 F (36.6 C) (Temporal)   Ht 5\' 3"  (1.6 m)   Wt 151 lb 8 oz (68.7 kg)   LMP 03/09/2019   SpO2 100%   BMI 26.84 kg/m    Objective:   Physical Exam  Constitutional: She is oriented to person, place, and time. She appears well-nourished.  HENT:  Right Ear: Tympanic membrane and ear canal normal.  Left Ear: Tympanic membrane and ear canal normal.  Mouth/Throat: Oropharynx is clear and moist.  Eyes: Pupils are equal, round, and reactive to light. EOM are normal.  Neck: Neck supple.  Cardiovascular: Normal rate and regular rhythm.  Respiratory: Effort normal and breath sounds normal.  GI: Soft. Bowel sounds are normal. There is no abdominal tenderness.  Musculoskeletal: Normal range of motion.  Neurological: She is alert and oriented to person, place, and time. No cranial nerve deficit.  Reflex Scores:      Patellar reflexes are 2+ on the right side and 2+ on the left side. Skin: Skin is warm and dry.  Psychiatric: She has a normal mood and  affect.           Assessment & Plan:

## 2019-03-20 NOTE — Assessment & Plan Note (Signed)
Doing well overall on amitriptyline. Continue same.

## 2019-03-20 NOTE — Patient Instructions (Signed)
Stop by the lab prior to leaving today. I will notify you of your results once received.   Start sertraline (Zoloft) 25 mg once daily for anxiety. Start with 1/2 tablet daily for 6 days, then increase to 1 full tablet thereafter.  You will be contacted regarding your referral to therapy.  Please let us know if you have not been contacted within two weeks.   Continue exercising. You should be getting 150 minutes of moderate intensity exercise weekly.  Continue to work on a healthy diet.  Schedule a follow up visit for 6 weeks for anxiety follow up.  It was a pleasure to see you today!   Preventive Care 4-67 Years Old, Female Preventive care refers to visits with your health care provider and lifestyle choices that can promote health and wellness. This includes:  A yearly physical exam. This may also be called an annual well check.  Regular dental visits and eye exams.  Immunizations.  Screening for certain conditions.  Healthy lifestyle choices, such as eating a healthy diet, getting regular exercise, not using drugs or products that contain nicotine and tobacco, and limiting alcohol use. What can I expect for my preventive care visit? Physical exam Your health care provider will check your:  Height and weight. This may be used to calculate body mass index (BMI), which tells if you are at a healthy weight.  Heart rate and blood pressure.  Skin for abnormal spots. Counseling Your health care provider may ask you questions about your:  Alcohol, tobacco, and drug use.  Emotional well-being.  Home and relationship well-being.  Sexual activity.  Eating habits.  Work and work Statistician.  Method of birth control.  Menstrual cycle.  Pregnancy history. What immunizations do I need?  Influenza (flu) vaccine  This is recommended every year. Tetanus, diphtheria, and pertussis (Tdap) vaccine  You may need a Td booster every 10 years. Varicella (chickenpox)  vaccine  You may need this if you have not been vaccinated. Human papillomavirus (HPV) vaccine  If recommended by your health care provider, you may need three doses over 6 months. Measles, mumps, and rubella (MMR) vaccine  You may need at least one dose of MMR. You may also need a second dose. Meningococcal conjugate (MenACWY) vaccine  One dose is recommended if you are age 63-21 years and a first-year college student living in a residence hall, or if you have one of several medical conditions. You may also need additional booster doses. Pneumococcal conjugate (PCV13) vaccine  You may need this if you have certain conditions and were not previously vaccinated. Pneumococcal polysaccharide (PPSV23) vaccine  You may need one or two doses if you smoke cigarettes or if you have certain conditions. Hepatitis A vaccine  You may need this if you have certain conditions or if you travel or work in places where you may be exposed to hepatitis A. Hepatitis B vaccine  You may need this if you have certain conditions or if you travel or work in places where you may be exposed to hepatitis B. Haemophilus influenzae type b (Hib) vaccine  You may need this if you have certain conditions. You may receive vaccines as individual doses or as more than one vaccine together in one shot (combination vaccines). Talk with your health care provider about the risks and benefits of combination vaccines. What tests do I need?  Blood tests  Lipid and cholesterol levels. These may be checked every 5 years starting at age 38.  Hepatitis C  test.  Hepatitis B test. Screening  Diabetes screening. This is done by checking your blood sugar (glucose) after you have not eaten for a while (fasting).  Sexually transmitted disease (STD) testing.  BRCA-related cancer screening. This may be done if you have a family history of breast, ovarian, tubal, or peritoneal cancers.  Pelvic exam and Pap test. This may be  done every 3 years starting at age 68. Starting at age 41, this may be done every 5 years if you have a Pap test in combination with an HPV test. Talk with your health care provider about your test results, treatment options, and if necessary, the need for more tests. Follow these instructions at home: Eating and drinking   Eat a diet that includes fresh fruits and vegetables, whole grains, lean protein, and low-fat dairy.  Take vitamin and mineral supplements as recommended by your health care provider.  Do not drink alcohol if: ? Your health care provider tells you not to drink. ? You are pregnant, may be pregnant, or are planning to become pregnant.  If you drink alcohol: ? Limit how much you have to 0-1 drink a day. ? Be aware of how much alcohol is in your drink. In the [place] one drink equals one 12 oz bottle of beer (355 mL), one 5 oz glass of wine (148 mL), or one 1 oz glass of hard liquor (44 mL). Lifestyle  Take daily care of your teeth and gums.  Stay active. Exercise for at least 30 minutes on 5 or more days each week.  Do not use any products that contain nicotine or tobacco, such as cigarettes, e-cigarettes, and chewing tobacco. If you need help quitting, ask your health care provider.  If you are sexually active, practice safe sex. Use a condom or other form of birth control (contraception) in order to prevent pregnancy and STIs (sexually transmitted infections). If you plan to become pregnant, see your health care provider for a preconception visit. What's next?  Visit your health care provider once a year for a well check visit.  Ask your health care provider how often you should have your eyes and teeth checked.  Stay up to date on all vaccines. This information is not intended to replace advice given to you by your health care provider. Make sure you discuss any questions you have with your health care provider. Document Released: 05/30/2001 Document Revised:  12/13/2017 Document Reviewed: 12/13/2017 Elsevier Patient Education  2020 [employer].

## 2019-03-20 NOTE — Addendum Note (Signed)
Addended by: Jacqualin Combes on: 03/20/2019 10:23 AM   Modules accepted: Orders

## 2019-05-01 ENCOUNTER — Other Ambulatory Visit: Payer: Self-pay

## 2019-05-01 ENCOUNTER — Encounter: Payer: Self-pay | Admitting: Primary Care

## 2019-05-01 ENCOUNTER — Ambulatory Visit (INDEPENDENT_AMBULATORY_CARE_PROVIDER_SITE_OTHER): Payer: BC Managed Care – PPO | Admitting: Primary Care

## 2019-05-01 DIAGNOSIS — G4700 Insomnia, unspecified: Secondary | ICD-10-CM

## 2019-05-01 DIAGNOSIS — F4323 Adjustment disorder with mixed anxiety and depressed mood: Secondary | ICD-10-CM | POA: Diagnosis not present

## 2019-05-01 MED ORDER — ESCITALOPRAM OXALATE 10 MG PO TABS: 10.0000 mg | ORAL_TABLET | Freq: Every day | ORAL | 0 refills | Status: DC

## 2019-05-01 NOTE — Progress Notes (Signed)
Subjective:    Patient ID: Danielle Maxwell, female    DOB: 1980-05-27, 39 y.o.   MRN: [id number]  HPI  This visit occurred during the SARS-CoV-2 public health emergency.  Safety protocols were in place, including screening questions prior to the visit, additional usage of staff PPE, and extensive cleaning of exam room while observing appropriate contact time as indicated for disinfecting solutions.   Danielle Maxwell is a 39 year old female with a history of migraines, GERD, anxiety and depression who presents today for follow up.  She was last evaluated on 03/20/19 for her annual physical and mentioned chronic anxiety since March 2020. GAD 7 score of 14 with symptoms of feeling anxious/nervous/worried. She had been on Zoloft in the past but didn't like the way it caused her to feel. Given symptoms we initiated her on Lexapro 5 mg.   Since her last visit she's doing better. Positive effects include less anxiety, less GI upset, less worry during the day. She has noticed symptoms increase on Sunday's and each week day evening. She's not sure what is causing this but she does struggle. She denies SI/HI.   She does still struggle with sleep. She feels restless at night when sleeping, still has trouble relaxing, now having vivid dreams. She does notice feeling more rested the following day. She's tried Melatonin and natural supplements without improvement. She is managed on amitriptyline HS for migraine prevention.   Review of Systems  Gastrointestinal: Negative for nausea.  Neurological:       Infrequent headaches  Psychiatric/Behavioral: Positive for sleep disturbance. The patient is nervous/anxious.        See HPI       Past Medical History:  Diagnosis Date  . Abnormal Pap smear 2002  . Depression 2008  . H/O chlamydia infection   . H/O rubella   . H/O urinary frequency 09/22/2002  . History of varicella   . Hx: UTI (urinary tract infection) 04/2009  . Monilial vaginitis 06/12/2003  . NSVD  (normal spontaneous vaginal delivery) 12/31/2011   2227  . Polyhydramnios in second trimester   . Yeast infection      Social History   Socioeconomic History  . Marital status: Married    Spouse name: Not on file  . Number of children: Not on file  . Years of education: Not on file  . Highest education level: Not on file  Occupational History  . Not on file  Tobacco Use  . Smoking status: Never Smoker  . Smokeless tobacco: Never Used  Substance and Sexual Activity  . Alcohol use: Yes  . Drug use: No  . Sexual activity: Yes    Partners: Male    Birth control/protection: None  Other Topics Concern  . Not on file  Social History Narrative   Married.   2 children.   Works at [employer].   Enjoys reading.    Social Determinants of Health   Financial Resource Strain:   . Difficulty of Paying Living Expenses: Not on file  Food Insecurity:   . Worried About Charity fundraiser in the Last Year: Not on file  . Ran Out of Food in the Last Year: Not on file  Transportation Needs:   . Lack of Transportation (Medical): Not on file  . Lack of Transportation (Non-Medical): Not on file  Physical Activity:   . Days of Exercise per Week: Not on file  . Minutes of Exercise per Session: Not on file  Stress:   .  Feeling of Stress : Not on file  Social Connections:   . Frequency of Communication with Friends and Family: Not on file  . Frequency of Social Gatherings with Friends and Family: Not on file  . Attends Religious Services: Not on file  . Active Member of Clubs or Organizations: Not on file  . Attends Banker Meetings: Not on file  . Marital Status: Not on file  Intimate Partner Violence:   . Fear of Current or Ex-Partner: Not on file  . Emotionally Abused: Not on file  . Physically Abused: Not on file  . Sexually Abused: Not on file    Past Surgical History:  Procedure Laterality Date  . WISDOM TOOTH EXTRACTION      Family History  Problem Relation Age of  Onset  . Diabetes Father   . Hypertension Maternal Aunt   . Hypertension Maternal Grandmother   . Hypertension Maternal Grandfather   . Diabetes Maternal Grandfather   . Prostate cancer Maternal Grandfather   . Diabetes Paternal Grandmother   . Anesthesia problems Neg Hx     No Known Allergies  Current Outpatient Medications on File Prior to Visit  Medication Sig Dispense Refill  . amitriptyline (ELAVIL) 10 MG tablet Take 1 tablet (10 mg total) by mouth at bedtime. For headache prevention. 90 tablet 3  . diphenhydrAMINE (BENADRYL) 25 MG tablet Take 25 mg by mouth every 6 (six) hours as needed.    Marland Kitchen esomeprazole (NEXIUM) 40 MG capsule Take 1 capsule (40 mg total) by mouth daily. For heartburn. 30 capsule 0  . fluticasone (FLONASE) 50 MCG/ACT nasal spray Place 2 sprays into both nostrils daily. 16 g 0  . promethazine (PHENERGAN) 25 MG tablet Take 1 tablet (25 mg total) by mouth every 8 (eight) hours as needed for nausea or vomiting (sedation caution). 20 tablet 0  . SUMAtriptan (IMITREX) 100 MG tablet Take 1 tablet (100 mg total) by mouth every 2 (two) hours as needed for migraine (max 2 doses in 24 hours). 10 tablet 0   No current facility-administered medications on file prior to visit.    BP 116/78   Pulse 90   Temp 97.7 F (36.5 C)   Ht 5\' 3"  (1.6 m)   Wt 145 lb 9 oz (66 kg)   SpO2 100%   BMI 25.78 kg/m    Objective:   Physical Exam  Constitutional: She appears well-nourished.  Cardiovascular: Normal rate and regular rhythm.  Respiratory: Effort normal.  Musculoskeletal:     Cervical back: Neck supple.  Skin: Skin is warm and dry.  Psychiatric: She has a normal mood and affect.           Assessment & Plan:

## 2019-05-01 NOTE — Patient Instructions (Signed)
We've increased the dose of your Lexapro to 10 mg. Please update me in four weeks via my chart as discussed.  It was a pleasure to see you today!

## 2019-05-01 NOTE — Assessment & Plan Note (Signed)
Improved significantly during the day at work, still struggles in the evenings and on Sundays.  Discussed to work on a new home routine during Sundays, doing something more exciting to offset the feeling of returning to work on Monday.   We also decided to increase her dose of Lexapro to 10 mg. She will update in a few weeks.

## 2019-05-01 NOTE — Assessment & Plan Note (Signed)
Slight improvement with initiation of Lexapro 5 mg, not completely improved. Discussed options and we decided to trial a dose increase of Lexapro to 10 mg.  She will update in a few weeks.

## 2019-05-26 DIAGNOSIS — F4323 Adjustment disorder with mixed anxiety and depressed mood: Secondary | ICD-10-CM

## 2019-05-27 MED ORDER — BUSPIRONE HCL 7.5 MG PO TABS: 7.5000 mg | ORAL_TABLET | Freq: Two times a day (BID) | ORAL | 0 refills | Status: DC

## 2019-05-27 MED ORDER — ESCITALOPRAM OXALATE 10 MG PO TABS: 10.0000 mg | ORAL_TABLET | Freq: Every day | ORAL | 1 refills | Status: DC

## 2019-05-30 MED ORDER — HYDROXYZINE HCL 10 MG PO TABS: 10.0000 mg | ORAL_TABLET | Freq: Two times a day (BID) | ORAL | 0 refills | Status: DC | PRN

## 2019-05-30 NOTE — Telephone Encounter (Signed)
Spoken to the pharmacist at [facility]. When she try to process the Rx, it stated that the insurance will not over buspirone at all, need to change medication.

## 2019-05-30 NOTE — Telephone Encounter (Signed)
Danielle Maxwell, will you check on why her insurance didn't cover the buspirone? Was it the dose?

## 2019-06-16 ENCOUNTER — Ambulatory Visit (INDEPENDENT_AMBULATORY_CARE_PROVIDER_SITE_OTHER): Payer: BC Managed Care – PPO | Admitting: Psychology

## 2019-06-16 DIAGNOSIS — F410 Panic disorder [episodic paroxysmal anxiety] without agoraphobia: Secondary | ICD-10-CM

## 2019-06-16 DIAGNOSIS — F4323 Adjustment disorder with mixed anxiety and depressed mood: Secondary | ICD-10-CM

## 2019-06-19 MED ORDER — BUSPIRONE HCL 5 MG PO TABS: 5.0000 mg | ORAL_TABLET | Freq: Two times a day (BID) | ORAL | 0 refills | Status: DC

## 2019-06-19 NOTE — Addendum Note (Signed)
Addended by: Doreene Nest on: 06/19/2019 04:09 PM   Modules accepted: Orders

## 2019-06-25 ENCOUNTER — Ambulatory Visit (INDEPENDENT_AMBULATORY_CARE_PROVIDER_SITE_OTHER): Payer: BC Managed Care – PPO | Admitting: Primary Care

## 2019-06-25 ENCOUNTER — Encounter: Payer: Self-pay | Admitting: Primary Care

## 2019-06-25 ENCOUNTER — Emergency Department
Admission: EM | Admit: 2019-06-25 | Discharge: 2019-06-25 | Disposition: A | Discharge status: Home / Self Care | Payer: BC Managed Care – PPO | Attending: Student | Admitting: Student

## 2019-06-25 ENCOUNTER — Other Ambulatory Visit: Payer: Self-pay

## 2019-06-25 ENCOUNTER — Encounter: Payer: Self-pay | Admitting: Emergency Medicine

## 2019-06-25 DIAGNOSIS — R197 Diarrhea, unspecified: Secondary | ICD-10-CM | POA: Diagnosis not present

## 2019-06-25 DIAGNOSIS — R109 Unspecified abdominal pain: Secondary | ICD-10-CM | POA: Diagnosis not present

## 2019-06-25 DIAGNOSIS — Z79899 Other long term (current) drug therapy: Secondary | ICD-10-CM | POA: Diagnosis not present

## 2019-06-25 DIAGNOSIS — K219 Gastro-esophageal reflux disease without esophagitis: Secondary | ICD-10-CM | POA: Diagnosis not present

## 2019-06-25 DIAGNOSIS — R112 Nausea with vomiting, unspecified: Secondary | ICD-10-CM | POA: Diagnosis not present

## 2019-06-25 DIAGNOSIS — R110 Nausea: Secondary | ICD-10-CM | POA: Diagnosis not present

## 2019-06-25 DIAGNOSIS — R1013 Epigastric pain: Secondary | ICD-10-CM

## 2019-06-25 LAB — COMPREHENSIVE METABOLIC PANEL
ALT: 9 U/L (ref 0–44)
AST: 17 U/L (ref 15–41)
Albumin: 3.7 g/dL (ref 3.5–5.0)
Alkaline Phosphatase: 32 U/L — ABNORMAL LOW (ref 38–126)
Anion gap: 4 — ABNORMAL LOW (ref 5–15)
BUN: 5 mg/dL — ABNORMAL LOW (ref 6–20)
CO2: 28 mmol/L (ref 22–32)
Calcium: 8.5 mg/dL — ABNORMAL LOW (ref 8.9–10.3)
Chloride: 106 mmol/L (ref 98–111)
Creatinine, Ser: 0.72 mg/dL (ref 0.44–1.00)
GFR calc Af Amer: >60 mL/min (ref >60)
GFR calc non Af Amer: >60 mL/min (ref >60)
Glucose, Bld: 132 mg/dL — ABNORMAL HIGH (ref 70–99)
Potassium: 3.1 mmol/L — ABNORMAL LOW (ref 3.5–5.1)
Sodium: 138 mmol/L (ref 135–145)
Total Bilirubin: 0.4 mg/dL (ref 0.3–1.2)
Total Protein: 7.5 g/dL (ref 6.5–8.1)

## 2019-06-25 LAB — CBC
HCT: 37.9 % (ref 36.0–46.0)
Hemoglobin: 11.9 g/dL — ABNORMAL LOW (ref 12.0–15.0)
MCH: 26.6 pg (ref 26.0–34.0)
MCHC: 31.4 g/dL (ref 30.0–36.0)
MCV: 84.8 fL (ref 80.0–100.0)
Platelets: 312 10*3/uL (ref 150–400)
RBC: 4.47 MIL/uL (ref 3.87–5.11)
RDW: 17.5 % — ABNORMAL HIGH (ref 11.5–15.5)
WBC: 7.1 10*3/uL (ref 4.0–10.5)
nRBC: 0 % (ref 0.0–0.2)

## 2019-06-25 LAB — URINALYSIS, COMPLETE (UACMP) WITH MICROSCOPIC
Bilirubin Urine: NEGATIVE
Glucose, UA: NEGATIVE mg/dL
Ketones, ur: NEGATIVE mg/dL
Nitrite: NEGATIVE
Protein, ur: NEGATIVE mg/dL
Specific Gravity, Urine: 1.023 (ref 1.005–1.030)
pH: 6 (ref 5.0–8.0)

## 2019-06-25 LAB — TROPONIN I (HIGH SENSITIVITY): Troponin I (High Sensitivity): <2 ng/L (ref <18)

## 2019-06-25 LAB — POCT PREGNANCY, URINE: Preg Test, Ur: NEGATIVE

## 2019-06-25 LAB — LIPASE, BLOOD: Lipase: 29 U/L (ref 11–51)

## 2019-06-25 MED ORDER — ESOMEPRAZOLE MAGNESIUM 40 MG PO CPDR: 40.0000 mg | DELAYED_RELEASE_CAPSULE | Freq: Every day | ORAL | 0 refills | Status: DC

## 2019-06-25 MED ORDER — ALUM & MAG HYDROXIDE-SIMETH 200-200-20 MG/5ML PO SUSP
30.0000 mL | Freq: Once | ORAL | Status: AC
  Administered 2019-06-25: 30 mL via ORAL
  Filled 2019-06-25: qty 30

## 2019-06-25 MED ORDER — POTASSIUM CHLORIDE CRYS ER 20 MEQ PO TBCR
40.0000 meq | EXTENDED_RELEASE_TABLET | Freq: Once | ORAL | Status: AC
  Administered 2019-06-25: 40 meq via ORAL
  Filled 2019-06-25: qty 2

## 2019-06-25 NOTE — Assessment & Plan Note (Signed)
Symptoms today representative of GERD, especially given normal CBC and negative Lipase.   Rx for Nexium 40 mg provided, she did well on this in the past. Stop Buspar.  Use promethazine PRN.  She will update in a few days.

## 2019-06-25 NOTE — Assessment & Plan Note (Signed)
Acute for one week, no one else at home has these symptoms.  Suspect Buspar to be contributing.  Doesn't seem to be Covid related. Labs from ED visit reassuring. No recent antibiotic use.  Hold Buspar. Continue to work on hydration with water. Bland diet.   She will update in a few days.

## 2019-06-25 NOTE — Progress Notes (Signed)
Subjective:    Patient ID: Danielle Maxwell, female    DOB: 03-02-1981, 39 y.o.   MRN: [id number]  HPI  Virtual Visit via Video Note  I connected with Danielle Maxwell on 06/25/19 at 11:00 AM EST by a video enabled telemedicine application and verified that I am speaking with the correct person using two identifiers.  Location: Patient: Musician Provider: Office   I discussed the limitations of evaluation and management by telemedicine and the availability of in person appointments. The patient expressed understanding and agreed to proceed.  History of Present Illness:  Danielle Maxwell is a 39 year old female with a history of migraines, GERD, anxiety and depression, frequent headaches, insomnia who presents today with a chief complaint of abdominal pain.  Her pain is located to the epigastric region with movement up to the center of her chest. She will experience pressure in the chest, feel nausea, then will dry heave. This began 2-3 weeks ago, worse over last week. She has had increased anxiety and stress.  About one week ago she started noticing diarrhea which occurs after eating anything. No one else in her household has these symptoms.  She denies bloody stools, recent antibiotic use, cough, loss of taste/smell, recent travel, fevers, other abdominal pain including LUQ. She resumed Nexium 20 mg BID one week ago which hasn't helped much. She's also been taking promethazine with temporary improvement. The only thing that has changed recently was the addition of Buspar.   She was evaluated at [facility] ED last night for same symptoms. Labs including Lipase, UA, CBC without obvious cause for symptoms. She was treated with oral potassium due to deficiency. ECG without acute ischemic changes. She was discharged home at 2 am today.   Observations/Objective:  Alert and oriented. Appears well, not sickly. No distress. Speaking in complete sentences.   Assessment and Plan:  See problem based  charting.  Follow Up Instructions:  Stop buspirone (Buspar) for anxiety.  Stop Nexium 20 mg. Start Nexium 40 mg once daily for heartburn.  Use the promethazine as needed for nausea.  Please update me Friday this week.  It was a pleasure to see you today! Danielle Bossier, NP-C    I discussed the assessment and treatment plan with the patient. The patient was provided an opportunity to ask questions and all were answered. The patient agreed with the plan and demonstrated an understanding of the instructions.   The patient was advised to call back or seek an in-person evaluation if the symptoms worsen or if the condition fails to improve as anticipated.    Danielle Koch, NP    Review of Systems  Constitutional: Negative for fever.  Respiratory: Negative for cough.   Gastrointestinal: Positive for abdominal pain, diarrhea and nausea. Negative for blood in stool.       Esophageal burning and reflux       Past Medical History:  Diagnosis Date  . Abnormal Pap smear 2002  . Depression 2008  . H/O chlamydia infection   . H/O rubella   . H/O urinary frequency 09/22/2002  . History of varicella   . Hx: UTI (urinary tract infection) 04/2009  . Monilial vaginitis 06/12/2003  . NSVD (normal spontaneous vaginal delivery) 12/31/2011   2227  . Polyhydramnios in second trimester   . Yeast infection      Social History   Socioeconomic History  . Marital status: Married    Spouse name: Not on file  . Number of children:  Not on file  . Years of education: Not on file  . Highest education level: Not on file  Occupational History  . Not on file  Tobacco Use  . Smoking status: Never Smoker  . Smokeless tobacco: Never Used  Substance and Sexual Activity  . Alcohol use: Yes  . Drug use: No  . Sexual activity: Yes    Partners: Male    Birth control/protection: None  Other Topics Concern  . Not on file  Social History Narrative   Married.   2 children.   Works at [employer].    Enjoys reading.    Social Determinants of Health   Financial Resource Strain:   . Difficulty of Paying Living Expenses: Not on file  Food Insecurity:   . Worried About Programme researcher, broadcasting/film/video in the Last Year: Not on file  . Ran Out of Food in the Last Year: Not on file  Transportation Needs:   . Lack of Transportation (Medical): Not on file  . Lack of Transportation (Non-Medical): Not on file  Physical Activity:   . Days of Exercise per Week: Not on file  . Minutes of Exercise per Session: Not on file  Stress:   . Feeling of Stress : Not on file  Social Connections:   . Frequency of Communication with Friends and Family: Not on file  . Frequency of Social Gatherings with Friends and Family: Not on file  . Attends Religious Services: Not on file  . Active Member of Clubs or Organizations: Not on file  . Attends Banker Meetings: Not on file  . Marital Status: Not on file  Intimate Partner Violence:   . Fear of Current or Ex-Partner: Not on file  . Emotionally Abused: Not on file  . Physically Abused: Not on file  . Sexually Abused: Not on file    Past Surgical History:  Procedure Laterality Date  . WISDOM TOOTH EXTRACTION      Family History  Problem Relation Age of Onset  . Diabetes Father   . Hypertension Maternal Aunt   . Hypertension Maternal Grandmother   . Hypertension Maternal Grandfather   . Diabetes Maternal Grandfather   . Prostate cancer Maternal Grandfather   . Diabetes Paternal Grandmother   . Anesthesia problems Neg Hx     No Known Allergies  Current Outpatient Medications on File Prior to Visit  Medication Sig Dispense Refill  . amitriptyline (ELAVIL) 10 MG tablet Take 1 tablet (10 mg total) by mouth at bedtime. For headache prevention. 90 tablet 3  . busPIRone (BUSPAR) 5 MG tablet Take 1 tablet (5 mg total) by mouth 2 (two) times daily. For anxiety. 60 tablet 0  . diphenhydrAMINE (BENADRYL) 25 MG tablet Take 25 mg by mouth every 6 (six)  hours as needed.    Marland Kitchen escitalopram (LEXAPRO) 10 MG tablet Take 1 tablet (10 mg total) by mouth daily. For anxiety. 90 tablet 1  . fluticasone (FLONASE) 50 MCG/ACT nasal spray Place 2 sprays into both nostrils daily. 16 g 0  . promethazine (PHENERGAN) 25 MG tablet Take 1 tablet (25 mg total) by mouth every 8 (eight) hours as needed for nausea or vomiting (sedation caution). 20 tablet 0  . SUMAtriptan (IMITREX) 100 MG tablet Take 1 tablet (100 mg total) by mouth every 2 (two) hours as needed for migraine (max 2 doses in 24 hours). 10 tablet 0  . XULANE 150-35 MCG/24HR transdermal patch 1 patch once a week.     No  current facility-administered medications on file prior to visit.    LMP 05/29/2019    Objective:   Physical Exam  Constitutional: She is oriented to person, place, and time. She appears well-nourished. She does not appear ill.  Respiratory: Effort normal.  Neurological: She is alert and oriented to person, place, and time.  Psychiatric: She has a normal mood and affect.           Assessment & Plan:

## 2019-06-25 NOTE — Discharge Instructions (Addendum)
Thank you for letting us take care of you in the emergency department today.   Please continue to take any regular, prescribed medications.   Please follow up with: Your primary care doctor to review your ER visit and follow up on your symptoms.  GI doctor, information/referral as below  Please return to the ER for any new or worsening symptoms.

## 2019-06-25 NOTE — ED Provider Notes (Signed)
[facility] Emergency Department Provider Note  ____________________________________________   First MD Initiated Contact with Patient 06/25/19 0107     (approximate)  I have reviewed the triage vital signs and the nursing notes.  History  Chief Complaint Abdominal Pain    HPI Danielle Maxwell is a 39 y.o. female past medical history as below who presents to the emergency department for chronic vague abdominal discomfort and nausea/dry heaves, as well as 1 week of loose stool.  Patient states she has had ongoing vague abdominal discomfort and dry heaves for at least a year.  Her primary doctor felt this was likely due to anxiety and started her on Lexapro.  After initiating this she did have some improvement in symptoms, however they again recurred and have been ongoing again for months now.  She describes her symptoms as a vague gripping type discomfort located in the epigastrium, currently 8/10 in severity.  No radiation.  Seemingly no alleviating or aggravating components.  She has associated dry heaves, which are really only present at nighttime.  No vomiting.  She states she presented to the emergency department today because normally she is able to ignore her symptoms enough to fall asleep, but was unable to tonight.  She denies any recent travel, new foods or exposures.  Her primary doctor did recently add on another anxiety medication, BuSpar within the last week, but again she clarifies the symptoms have been going on for at least months to years. No other new medications or over the counters.  Does report some intermittent diarrhea over the last week, she describes as loose brown stool.  2-3 episodes a day.  Nonbloody.  No recent antibiotics. No sick contacts.  No fevers.  No dysuria or malodorous urine.  Has not seen a GI doctor previously.  Cannot identify any recent stressor or anxiety etiology that may be worsening her symptoms.   Does take Nexium for  GERD.   Past Medical Hx Past Medical History:  Diagnosis Date  . Abnormal Pap smear 2002  . Depression 2008  . H/O chlamydia infection   . H/O rubella   . H/O urinary frequency 09/22/2002  . History of varicella   . Hx: UTI (urinary tract infection) 04/2009  . Monilial vaginitis 06/12/2003  . NSVD (normal spontaneous vaginal delivery) 12/31/2011   2227  . Polyhydramnios in second trimester   . Yeast infection     Problem List Patient Active Problem List   Diagnosis Date Noted  . GERD (gastroesophageal reflux disease) 03/20/2019  . Frequent headaches 01/14/2018  . Insomnia 01/14/2018  . Migraine 10/24/2017  . Preventative health care 03/09/2015  . Ganglion cyst of right foot 03/09/2015  . Plantar fasciitis, bilateral 02/11/2014  . Adjustment disorder with mixed anxiety and depressed mood 02/11/2014    Past Surgical Hx Past Surgical History:  Procedure Laterality Date  . WISDOM TOOTH EXTRACTION      Medications Prior to Admission medications   Medication Sig Start Date End Date Taking? Authorizing Provider  amitriptyline (ELAVIL) 10 MG tablet Take 1 tablet (10 mg total) by mouth at bedtime. For headache prevention. 03/20/19   Pleas Koch, NP  busPIRone (BUSPAR) 5 MG tablet Take 1 tablet (5 mg total) by mouth 2 (two) times daily. For anxiety. 06/19/19   Pleas Koch, NP  diphenhydrAMINE (BENADRYL) 25 MG tablet Take 25 mg by mouth every 6 (six) hours as needed.    [provider]  escitalopram (LEXAPRO) 10 MG tablet  Take 1 tablet (10 mg total) by mouth daily. For anxiety. 05/27/19   Doreene Nest, NP  esomeprazole (NEXIUM) 40 MG capsule Take 1 capsule (40 mg total) by mouth daily. For heartburn. 03/20/19   Doreene Nest, NP  fluticasone (FLONASE) 50 MCG/ACT nasal spray Place 2 sprays into both nostrils daily. 11/03/16   Joaquim Nam, MD  promethazine (PHENERGAN) 25 MG tablet Take 1 tablet (25 mg total) by mouth every 8 (eight) hours as needed for  nausea or vomiting (sedation caution). 03/20/19   Doreene Nest, NP  SUMAtriptan (IMITREX) 100 MG tablet Take 1 tablet (100 mg total) by mouth every 2 (two) hours as needed for migraine (max 2 doses in 24 hours). 10/23/17   Joaquim Nam, MD    Allergies Patient has no known allergies.  Family Hx Family History  Problem Relation Age of Onset  . Diabetes Father   . Hypertension Maternal Aunt   . Hypertension Maternal Grandmother   . Hypertension Maternal Grandfather   . Diabetes Maternal Grandfather   . Prostate cancer Maternal Grandfather   . Diabetes Paternal Grandmother   . Anesthesia problems Neg Hx     Social Hx Social History   Tobacco Use  . Smoking status: Never Smoker  . Smokeless tobacco: Never Used  Substance Use Topics  . Alcohol use: Yes  . Drug use: No     Review of Systems  Constitutional: Negative for fever, chills. Eyes: Negative for visual changes. ENT: Negative for sore throat. Cardiovascular: Negative for chest pain. Respiratory: Negative for shortness of breath. Gastrointestinal: Positive for dry heaves, epigastric discomfort, loose stool. Genitourinary: Negative for dysuria. Musculoskeletal: Negative for leg swelling. Skin: Negative for rash. Neurological: Negative for headaches.   Physical Exam  Vital Signs: ED Triage Vitals  Enc Vitals Group     BP 06/25/19 0040 131/88     Pulse Rate 06/25/19 0040 91     Resp 06/25/19 0040 20     Temp 06/25/19 0040 98.6 F (37 C)     Temp Source 06/25/19 0040 Oral     SpO2 06/25/19 0040 100 %     Weight 06/25/19 0024 145 lb (65.8 kg)     Height 06/25/19 0024 5\' 3"  (1.6 m)     Head Circumference --      Peak Flow --      Pain Score 06/25/19 0024 8     Pain Loc --      Pain Edu? --      Excl. in GC? --     Constitutional: Alert and oriented. Well appearing.  Head: Normocephalic. Atraumatic. Eyes: Conjunctivae clear, sclera anicteric. Pupils equal and symmetric. Nose: No masses or  lesions. No congestion or rhinorrhea. Mouth/Throat: Wearing mask.  Neck: No stridor. Trachea midline.  Cardiovascular: Normal rate, regular rhythm. Extremities well perfused. Respiratory: Normal respiratory effort.  Lungs CTAB. Gastrointestinal: Soft. Non-distended. Non-tender throughout to deep palpation.  Genitourinary: Deferred. Musculoskeletal: No lower extremity edema. No deformities. Neurologic:  Normal speech and language. No gross focal or lateralizing neurologic deficits are appreciated.  Skin: Skin is warm, dry and intact. No rash noted. Psychiatric: Mood and affect are appropriate for situation.  EKG  Personally reviewed and interpreted by myself.   Rate: 88 Rhythm: sinus Axis: normal Intervals: WNL No acute ischemic changes No STEMI    Radiology   N/A   Procedures  Procedure(s) performed (including critical care):  Procedures   Initial Impression / Assessment and Plan / MDM /  ED Course  39 y.o. female who presents to the ED for chronic, months to years, epigastric discomfort and dry heaves.  1 week of loose stool.  Ddx: IBS, anxiety, functional abdominal pain, atypical ACS. Exam is reassuring, abdomen is soft and completely non-tender throughout. Given this and chronicity of symptoms do not feel emergent imaging indicated at this time.   Labs reveal very mild hypokalemia, will give PO repletion.  Remainder of electrolytes without actionable derangements.  Lipase within normal limits.  Negative pregnancy.  No evidence of UTI.  EKG without acute ischemic changes.  Troponin negative.  Given negative work-up, feel patient is stable for discharge with outpatient follow-up.  Given information/referral for GI to follow-up in clinic, as well as continue to follow-up with her PCP.  Patient voices understanding is comfortable plan and discharge.  Given return precautions.    _______________________________  As part of my medical decision making I have reviewed  available labs, radiology tests, reviewed old records.  Final Clinical Impression(s) / ED Diagnosis  Final diagnoses:  Chronic nausea  Diarrhea in adult patient  Epigastric discomfort       Note:  This document was prepared using Dragon voice recognition software and may include unintentional dictation errors.   Miguel Aschoff., MD 06/25/19 [phone]

## 2019-06-25 NOTE — Patient Instructions (Signed)
Stop buspirone (Buspar) for anxiety.  Stop Nexium 20 mg. Start Nexium 40 mg once daily for heartburn.  Use the promethazine as needed for nausea.  Please update me Friday this week.  It was a pleasure to see you today! Mayra Reel, NP-C

## 2019-06-25 NOTE — ED Triage Notes (Signed)
Pt presents to ED with epigastric abd pain for the past several weeks. +vomiting/"dry heaving"; only vomits at night. Pt states she was prescribed nausea medication 2 days ago and states its not working anymore. Seen by her pcp and was tx for anxiety. Pt states her symptoms are not worse but are also not improving.

## 2019-06-27 ENCOUNTER — Other Ambulatory Visit: Payer: Self-pay

## 2019-06-27 ENCOUNTER — Other Ambulatory Visit (INDEPENDENT_AMBULATORY_CARE_PROVIDER_SITE_OTHER): Payer: BC Managed Care – PPO

## 2019-06-27 DIAGNOSIS — R109 Unspecified abdominal pain: Secondary | ICD-10-CM

## 2019-06-28 LAB — BASIC METABOLIC PANEL
BUN/Creatinine Ratio: 7 (calc) (ref 6–22)
BUN: 6 mg/dL — ABNORMAL LOW (ref 7–25)
CO2: 23 mmol/L (ref 20–32)
Calcium: 8.9 mg/dL (ref 8.6–10.2)
Chloride: 107 mmol/L (ref 98–110)
Creat: 0.84 mg/dL (ref 0.50–1.10)
Glucose, Bld: 105 mg/dL — ABNORMAL HIGH (ref 65–99)
Potassium: 3.7 mmol/L (ref 3.5–5.3)
Sodium: 140 mmol/L (ref 135–146)

## 2019-06-28 LAB — CBC WITH DIFFERENTIAL/PLATELET
Absolute Monocytes: 348 cells/uL (ref 200–950)
Basophils Absolute: 38 cells/uL (ref 0–200)
Basophils Relative: 0.8 %
Eosinophils Absolute: 385 cells/uL (ref 15–500)
Eosinophils Relative: 8.2 %
HCT: 36.9 % (ref 35.0–45.0)
Hemoglobin: 11.6 g/dL — ABNORMAL LOW (ref 11.7–15.5)
Lymphs Abs: 2139 cells/uL (ref 850–3900)
MCH: 26.2 pg — ABNORMAL LOW (ref 27.0–33.0)
MCHC: 31.4 g/dL — ABNORMAL LOW (ref 32.0–36.0)
MCV: 83.5 fL (ref 80.0–100.0)
MPV: 11.5 fL (ref 7.5–12.5)
Monocytes Relative: 7.4 %
Neutro Abs: 1791 cells/uL (ref 1500–7800)
Neutrophils Relative %: 38.1 %
Platelets: 270 10*3/uL (ref 140–400)
RBC: 4.42 10*6/uL (ref 3.80–5.10)
RDW: 17 % — ABNORMAL HIGH (ref 11.0–15.0)
Total Lymphocyte: 45.5 %
WBC: 4.7 10*3/uL (ref 3.8–10.8)

## 2019-06-28 LAB — LIPASE: Lipase: 15 U/L (ref 7–60)

## 2019-06-30 DIAGNOSIS — F4323 Adjustment disorder with mixed anxiety and depressed mood: Secondary | ICD-10-CM

## 2019-06-30 MED ORDER — PROPRANOLOL HCL 10 MG PO TABS: 10.0000 mg | ORAL_TABLET | Freq: Every day | ORAL | 0 refills | Status: DC | PRN

## 2019-07-04 ENCOUNTER — Ambulatory Visit (INDEPENDENT_AMBULATORY_CARE_PROVIDER_SITE_OTHER): Payer: BC Managed Care – PPO | Admitting: Psychology

## 2019-07-04 DIAGNOSIS — F410 Panic disorder [episodic paroxysmal anxiety] without agoraphobia: Secondary | ICD-10-CM | POA: Diagnosis not present

## 2019-07-08 ENCOUNTER — Telehealth: Payer: Self-pay

## 2019-07-08 ENCOUNTER — Other Ambulatory Visit: Payer: Self-pay

## 2019-07-08 ENCOUNTER — Emergency Department (HOSPITAL_COMMUNITY)
Admission: EM | Admit: 2019-07-08 | Discharge: 2019-07-09 | Disposition: A | Discharge status: Home / Self Care | Payer: BC Managed Care – PPO | Attending: Emergency Medicine | Admitting: Emergency Medicine

## 2019-07-08 ENCOUNTER — Encounter (HOSPITAL_COMMUNITY): Payer: Self-pay | Admitting: Emergency Medicine

## 2019-07-08 DIAGNOSIS — R197 Diarrhea, unspecified: Secondary | ICD-10-CM | POA: Insufficient documentation

## 2019-07-08 DIAGNOSIS — Z79899 Other long term (current) drug therapy: Secondary | ICD-10-CM | POA: Insufficient documentation

## 2019-07-08 DIAGNOSIS — R112 Nausea with vomiting, unspecified: Secondary | ICD-10-CM | POA: Insufficient documentation

## 2019-07-08 DIAGNOSIS — R1013 Epigastric pain: Secondary | ICD-10-CM | POA: Diagnosis not present

## 2019-07-08 LAB — COMPREHENSIVE METABOLIC PANEL
ALT: 11 U/L (ref 0–44)
AST: 16 U/L (ref 15–41)
Albumin: 3.8 g/dL (ref 3.5–5.0)
Alkaline Phosphatase: 33 U/L — ABNORMAL LOW (ref 38–126)
Anion gap: 12 (ref 5–15)
BUN: 5 mg/dL — ABNORMAL LOW (ref 6–20)
CO2: 20 mmol/L — ABNORMAL LOW (ref 22–32)
Calcium: 9.4 mg/dL (ref 8.9–10.3)
Chloride: 104 mmol/L (ref 98–111)
Creatinine, Ser: 0.83 mg/dL (ref 0.44–1.00)
GFR calc Af Amer: >60 mL/min (ref >60)
GFR calc non Af Amer: >60 mL/min (ref >60)
Glucose, Bld: 120 mg/dL — ABNORMAL HIGH (ref 70–99)
Potassium: 3.7 mmol/L (ref 3.5–5.1)
Sodium: 136 mmol/L (ref 135–145)
Total Bilirubin: 0.6 mg/dL (ref 0.3–1.2)
Total Protein: 7.5 g/dL (ref 6.5–8.1)

## 2019-07-08 LAB — I-STAT BETA HCG BLOOD, ED (MC, WL, AP ONLY): I-stat hCG, quantitative: <5 m[IU]/mL (ref <5)

## 2019-07-08 LAB — CBC
HCT: 39.8 % (ref 36.0–46.0)
Hemoglobin: 12.1 g/dL (ref 12.0–15.0)
MCH: 26.2 pg (ref 26.0–34.0)
MCHC: 30.4 g/dL (ref 30.0–36.0)
MCV: 86.1 fL (ref 80.0–100.0)
Platelets: 330 10*3/uL (ref 150–400)
RBC: 4.62 MIL/uL (ref 3.87–5.11)
RDW: 18 % — ABNORMAL HIGH (ref 11.5–15.5)
WBC: 6.5 10*3/uL (ref 4.0–10.5)
nRBC: 0 % (ref 0.0–0.2)

## 2019-07-08 LAB — LIPASE, BLOOD: Lipase: 20 U/L (ref 11–51)

## 2019-07-08 MED ORDER — SODIUM CHLORIDE 0.9% FLUSH: 3.0000 mL | Freq: Once | INTRAVENOUS | Status: DC

## 2019-07-08 NOTE — Telephone Encounter (Signed)
Pt had virtual visit on 06/25/19; pt said vomiting and dry heaves and watery diarrhea restarted on 07/06/19. Pt had dry heaves about 10' ago and last diarrhea stool was earlier this morning; did not see mucus or blood. Pt has constant upper mid abd pain that is sharp at pain level 7;pt has had abd pain since 07/06/19.no fever. Pt has been taking promethazine 25 mg q8h but is not helping and pepto bismol is not helping diarrhea. Pt has slight dry mouth,not able to keep liquids in system. Last urinated at 10 AM this morning and voiding less amt and not as often as normal. Pt is not dizzy. Pt wants note sent to Allayne Gitelman NP and request cb. Pt does not want to go to ED for eval and possible fluids unless Jae Dire thinks necessary. [facility] [address].

## 2019-07-08 NOTE — ED Triage Notes (Signed)
Pt states she has been  Having abd cramping, n/v/diarrhea since Sunday. Pt states she has had episodes of the same. Her primary MD is concerned it may be anxiety related. Pt denies any exposure to anyone sick.

## 2019-07-08 NOTE — Telephone Encounter (Signed)
Spoke with patient via phone, she is currently in the ED.  Discussed potentially doing stool studies, also treatment for potential IBS given her anxiety.  She will update tomorrow.

## 2019-07-09 MED ORDER — PROMETHAZINE HCL 25 MG/ML IJ SOLN
12.5000 mg | Freq: Once | INTRAMUSCULAR | Status: AC
  Administered 2019-07-09: 12.5 mg via INTRAVENOUS
  Filled 2019-07-09: qty 1

## 2019-07-09 MED ORDER — SODIUM CHLORIDE 0.9 % IV BOLUS
1000.0000 mL | Freq: Once | INTRAVENOUS | Status: AC
  Administered 2019-07-09: 1000 mL via INTRAVENOUS

## 2019-07-09 NOTE — ED Notes (Signed)
Pt able to tolerate PO intake without complaint of N/V.

## 2019-07-09 NOTE — Discharge Instructions (Addendum)
You were seen today for ongoing abdominal pain and diarrhea.  Work-up is reassuring.  You given fluids and nausea medication.  Feel that your neck step is evaluation by gastroenterology.  Continue Nexium and your nausea medications at home.  Avoid foods that may worsen symptoms.

## 2019-07-09 NOTE — ED Provider Notes (Signed)
[facility] EMERGENCY DEPARTMENT Provider Note   CSN: [id number] Arrival date & time: 07/08/19  1730     History Chief Complaint  Patient presents with  . Abdominal Cramping  . Emesis  . Diarrhea    Danielle Maxwell is a 39 y.o. female.  HPI     This is a 39 year old female with history of depression and anxiety who presents with nausea, abdominal cramping, and diarrhea.  Patient reports worsening symptoms over the last several days.  She states she has had multiple episodes of nonbloody diarrhea.  She also reports intense epigastric cramping that seems to improve with some dry heaving.  She has not actually vomited.  She states she has had symptoms off and on for several months.  Her doctor has been titrating her anxiety medications that she thought this might be related.  She is also on Nexium and a nausea medication which she takes as needed.  These help sometimes but she states her symptoms always come back.  She has not seen a gastroenterologist or had definitive testing.  She does not believe herself to be pregnant.  Denies urinary symptoms or fevers.  No Covid symptoms or loss of sense of taste or smell.  Currently she rates her pain at 8 out of 10.  Nothing seems to make it better or worse.  Past Medical History:  Diagnosis Date  . Abnormal Pap smear 2002  . Depression 2008  . H/O chlamydia infection   . H/O rubella   . H/O urinary frequency 09/22/2002  . History of varicella   . Hx: UTI (urinary tract infection) 04/2009  . Monilial vaginitis 06/12/2003  . NSVD (normal spontaneous vaginal delivery) 12/31/2011   2227  . Polyhydramnios in second trimester   . Yeast infection     Patient Active Problem List   Diagnosis Date Noted  . Acute diarrhea 06/25/2019  . GERD (gastroesophageal reflux disease) 03/20/2019  . Frequent headaches 01/14/2018  . Insomnia 01/14/2018  . Migraine 10/24/2017  . Preventative health care 03/09/2015  . Ganglion cyst of right  foot 03/09/2015  . Plantar fasciitis, bilateral 02/11/2014  . Adjustment disorder with mixed anxiety and depressed mood 02/11/2014    Past Surgical History:  Procedure Laterality Date  . WISDOM TOOTH EXTRACTION       OB History    Gravida  3   Para  2   Term  2   Preterm      AB  1   Living  2     SAB      TAB  0   Ectopic      Multiple      Live Births  2           Family History  Problem Relation Age of Onset  . Diabetes Father   . Hypertension Maternal Aunt   . Hypertension Maternal Grandmother   . Hypertension Maternal Grandfather   . Diabetes Maternal Grandfather   . Prostate cancer Maternal Grandfather   . Diabetes Paternal Grandmother   . Anesthesia problems Neg Hx     Social History   Tobacco Use  . Smoking status: Never Smoker  . Smokeless tobacco: Never Used  Substance Use Topics  . Alcohol use: Yes  . Drug use: No    Home Medications Prior to Admission medications   Medication Sig Start Date End Date Taking? Authorizing Provider  amitriptyline (ELAVIL) 10 MG tablet Take 1 tablet (10 mg total) by mouth at  bedtime. For headache prevention. 03/20/19  Yes Doreene Nest, NP  escitalopram (LEXAPRO) 10 MG tablet Take 1 tablet (10 mg total) by mouth daily. For anxiety. 05/27/19  Yes Doreene Nest, NP  esomeprazole (NEXIUM) 40 MG capsule Take 1 capsule (40 mg total) by mouth daily. For heartburn. 06/25/19  Yes Doreene Nest, NP  propranolol (INDERAL) 10 MG tablet Take 1-2 tablets (10-20 mg total) by mouth daily as needed. For anxiety. 06/30/19  Yes Doreene Nest, NP  SUMAtriptan (IMITREX) 100 MG tablet Take 1 tablet (100 mg total) by mouth every 2 (two) hours as needed for migraine (max 2 doses in 24 hours). 10/23/17  Yes Joaquim Nam, MD  [employer] 150-35 MCG/24HR transdermal patch Place 1 patch onto the skin every Sunday.  05/23/19  Yes [provider]  fluticasone (FLONASE) 50 MCG/ACT nasal spray Place 2 sprays into both  nostrils daily. Patient not taking: Reported on 07/09/2019 11/03/16   Joaquim Nam, MD  promethazine (PHENERGAN) 25 MG tablet Take 1 tablet (25 mg total) by mouth every 8 (eight) hours as needed for nausea or vomiting (sedation caution). Patient not taking: Reported on 07/09/2019 03/20/19   Doreene Nest, NP    Allergies    Patient has no known allergies.  Review of Systems   Review of Systems  Constitutional: Negative for fever.  Respiratory: Negative for cough and shortness of breath.   Cardiovascular: Negative for chest pain.  Gastrointestinal: Positive for abdominal pain, diarrhea and nausea. Negative for blood in stool, constipation and vomiting.  Genitourinary: Negative for dysuria.  Psychiatric/Behavioral: The patient is nervous/anxious.   All other systems reviewed and are negative.   Physical Exam Updated Vital Signs BP 124/83   Pulse 79   Temp 98.3 F (36.8 C) (Oral)   Resp 15   LMP 07/01/2019   SpO2 99%   Physical Exam Vitals and nursing note reviewed.  Constitutional:      Appearance: She is well-developed. She is not ill-appearing.  HENT:     Head: Normocephalic and atraumatic.     Mouth/Throat:     Mouth: Mucous membranes are moist.  Eyes:     Pupils: Pupils are equal, round, and reactive to light.  Cardiovascular:     Rate and Rhythm: Normal rate and regular rhythm.     Heart sounds: Normal heart sounds.  Pulmonary:     Effort: Pulmonary effort is normal. No respiratory distress.     Breath sounds: No wheezing.  Abdominal:     General: Bowel sounds are normal.     Palpations: Abdomen is soft.     Tenderness: There is no abdominal tenderness. There is no guarding or rebound.  Musculoskeletal:     Cervical back: Neck supple.     Right lower leg: No edema.     Left lower leg: No edema.  Skin:    General: Skin is warm and dry.  Neurological:     Mental Status: She is alert and oriented to person, place, and time.  Psychiatric:        Mood and  Affect: Mood normal.     ED Results / Procedures / Treatments   Labs (all labs ordered are listed, but only abnormal results are displayed) Labs Reviewed  COMPREHENSIVE METABOLIC PANEL - Abnormal; Notable for the following components:      Result Value   CO2 20 (*)    Glucose, Bld 120 (*)    BUN 5 (*)  Alkaline Phosphatase 33 (*)    All other components within normal limits  CBC - Abnormal; Notable for the following components:   RDW 18.0 (*)    All other components within normal limits  LIPASE, BLOOD  URINALYSIS, ROUTINE W REFLEX MICROSCOPIC  I-STAT BETA HCG BLOOD, ED ([facility], [facility], [facility] ONLY)    EKG EKG Interpretation  Date/Time:  Tuesday July 08 2019 18:34:08 EDT Ventricular Rate:  62 PR Interval:  128 QRS Duration: 62 QT Interval:  416 QTC Calculation: 422 R Axis:   70 Text Interpretation: Normal sinus rhythm with sinus arrhythmia Low voltage QRS Septal infarct , age undetermined Abnormal ECG No significant change since last tracing Confirmed by Thayer Jew [phone]) on 07/08/2019 11:27:03 PM   Radiology No results found.  Procedures Procedures (including critical care time)  Medications Ordered in ED Medications  sodium chloride flush (NS) 0.9 % injection 3 mL (has no administration in time range)  sodium chloride 0.9 % bolus 1,000 mL (1,000 mLs Intravenous New Bag/Given 07/09/19 0129)  promethazine (PHENERGAN) injection 12.5 mg (12.5 mg Intravenous Given 07/09/19 0129)    ED Course  I have reviewed the triage vital signs and the nursing notes.  Pertinent labs & imaging results that were available during my care of the patient were reviewed by me and considered in my medical decision making (see chart for details).    MDM Rules/Calculators/A&P                       Patient presents with crampy abdominal pain, nausea, and diarrhea.  She has had symptoms waxing and waning over several months.  She associates it with anxiety and has had some improvement  intermittently with medications.  She is overall nontoxic and vital signs are reassuring.  Her abdominal exam is benign and she is nontender.  Work-up reviewed.  Lipase is normal.  Doubt pancreatitis.  Gastritis, reflux, peptic ulcer is a consideration.  She takes Nexium at home.  She has no significant metabolic derangements and no leukocytosis.  Doubt other intra-abdominal pathology given her benign exam.  Patient was given fluids and Zofran.  She is able to orally hydrate.  Agree that her next evaluation needs to be by gastroenterology for possible endoscopy.  In the meantime she needs to continue her Nexium and as needed nausea medication.  Patient is comfortable with this plan.  After history, exam, and medical workup I feel the patient has been appropriately medically screened and is safe for discharge home. Pertinent diagnoses were discussed with the patient. Patient was given return precautions.   Final Clinical Impression(s) / ED Diagnoses Final diagnoses:  Nausea vomiting and diarrhea    Rx / DC Orders ED Discharge Orders    None       Nithya Meriweather, Barbette Hair, MD 07/09/19 [phone]

## 2019-07-10 DIAGNOSIS — R1013 Epigastric pain: Secondary | ICD-10-CM | POA: Diagnosis not present

## 2019-07-10 DIAGNOSIS — K219 Gastro-esophageal reflux disease without esophagitis: Secondary | ICD-10-CM | POA: Diagnosis not present

## 2019-07-10 DIAGNOSIS — K582 Mixed irritable bowel syndrome: Secondary | ICD-10-CM | POA: Diagnosis not present

## 2019-07-11 ENCOUNTER — Ambulatory Visit: Payer: BC Managed Care – PPO | Admitting: Psychology

## 2019-07-14 ENCOUNTER — Other Ambulatory Visit: Payer: Self-pay

## 2019-07-14 DIAGNOSIS — R1084 Generalized abdominal pain: Secondary | ICD-10-CM | POA: Diagnosis not present

## 2019-07-14 DIAGNOSIS — Z20822 Contact with and (suspected) exposure to covid-19: Secondary | ICD-10-CM | POA: Insufficient documentation

## 2019-07-14 DIAGNOSIS — E860 Dehydration: Secondary | ICD-10-CM | POA: Insufficient documentation

## 2019-07-14 DIAGNOSIS — R197 Diarrhea, unspecified: Secondary | ICD-10-CM | POA: Diagnosis not present

## 2019-07-14 DIAGNOSIS — R109 Unspecified abdominal pain: Secondary | ICD-10-CM | POA: Diagnosis not present

## 2019-07-14 DIAGNOSIS — R112 Nausea with vomiting, unspecified: Secondary | ICD-10-CM | POA: Diagnosis not present

## 2019-07-14 DIAGNOSIS — Z79899 Other long term (current) drug therapy: Secondary | ICD-10-CM | POA: Insufficient documentation

## 2019-07-14 NOTE — ED Triage Notes (Signed)
Pt arrives to ED via POV from home with c/o generalized abdominal pain, nausea, and diarrhea. Pt reports being seen several times recently for same; reports she is scheduled for a GI follow-up tomorrow morning. Pt reports 7 episodes of emesis and 9 episodes of diarrhea over the last 24 hrs. Pt is A&O, in NAD; RR even, regular, and unlabored.

## 2019-07-15 ENCOUNTER — Emergency Department
Admission: EM | Admit: 2019-07-15 | Discharge: 2019-07-15 | Disposition: A | Discharge status: Home / Self Care | Payer: BC Managed Care – PPO | Attending: Emergency Medicine | Admitting: Emergency Medicine

## 2019-07-15 ENCOUNTER — Emergency Department: Payer: BC Managed Care – PPO

## 2019-07-15 DIAGNOSIS — R112 Nausea with vomiting, unspecified: Secondary | ICD-10-CM | POA: Diagnosis not present

## 2019-07-15 DIAGNOSIS — E860 Dehydration: Secondary | ICD-10-CM

## 2019-07-15 DIAGNOSIS — R1084 Generalized abdominal pain: Secondary | ICD-10-CM | POA: Diagnosis not present

## 2019-07-15 DIAGNOSIS — R197 Diarrhea, unspecified: Secondary | ICD-10-CM | POA: Diagnosis not present

## 2019-07-15 DIAGNOSIS — R109 Unspecified abdominal pain: Secondary | ICD-10-CM | POA: Diagnosis not present

## 2019-07-15 LAB — CBC
HCT: 39.1 % (ref 36.0–46.0)
Hemoglobin: 12.2 g/dL (ref 12.0–15.0)
MCH: 26.5 pg (ref 26.0–34.0)
MCHC: 31.2 g/dL (ref 30.0–36.0)
MCV: 84.8 fL (ref 80.0–100.0)
Platelets: 305 10*3/uL (ref 150–400)
RBC: 4.61 MIL/uL (ref 3.87–5.11)
RDW: 17.5 % — ABNORMAL HIGH (ref 11.5–15.5)
WBC: 6.2 10*3/uL (ref 4.0–10.5)
nRBC: 0 % (ref 0.0–0.2)

## 2019-07-15 LAB — POCT PREGNANCY, URINE: Preg Test, Ur: NEGATIVE

## 2019-07-15 LAB — COMPREHENSIVE METABOLIC PANEL
ALT: 16 U/L (ref 0–44)
AST: 17 U/L (ref 15–41)
Albumin: 3.8 g/dL (ref 3.5–5.0)
Alkaline Phosphatase: 35 U/L — ABNORMAL LOW (ref 38–126)
Anion gap: 14 (ref 5–15)
BUN: 6 mg/dL (ref 6–20)
CO2: 20 mmol/L — ABNORMAL LOW (ref 22–32)
Calcium: 8.9 mg/dL (ref 8.9–10.3)
Chloride: 102 mmol/L (ref 98–111)
Creatinine, Ser: 0.86 mg/dL (ref 0.44–1.00)
GFR calc Af Amer: >60 mL/min (ref >60)
GFR calc non Af Amer: >60 mL/min (ref >60)
Glucose, Bld: 96 mg/dL (ref 70–99)
Potassium: 3.4 mmol/L — ABNORMAL LOW (ref 3.5–5.1)
Sodium: 136 mmol/L (ref 135–145)
Total Bilirubin: 0.8 mg/dL (ref 0.3–1.2)
Total Protein: 7.5 g/dL (ref 6.5–8.1)

## 2019-07-15 LAB — LIPASE, BLOOD: Lipase: 28 U/L (ref 11–51)

## 2019-07-15 LAB — URINALYSIS, COMPLETE (UACMP) WITH MICROSCOPIC
Bilirubin Urine: NEGATIVE
Glucose, UA: NEGATIVE mg/dL
Ketones, ur: 80 mg/dL — AB
Nitrite: NEGATIVE
Protein, ur: 100 mg/dL — AB
Specific Gravity, Urine: 1.029 (ref 1.005–1.030)
pH: 6 (ref 5.0–8.0)

## 2019-07-15 LAB — SARS CORONAVIRUS 2 (TAT 6-24 HRS): SARS Coronavirus 2: NEGATIVE

## 2019-07-15 IMAGING — CT CT ABD-PELV W/ CM
2 of 4 series · 16 of 46 positions shown, 18 images · IV contrast (APPLIED)
Comparison: None.

CLINICAL DATA: Generalized abdominal pain

EXAM:
CT ABDOMEN AND PELVIS WITH CONTRAST
TECHNIQUE: Multidetector CT imaging of the abdomen and pelvis was performed
using the standard protocol following bolus administration of
intravenous contrast.
CONTRAST:  100mL OMNIPAQUE IOHEXOL 300 MG/ML  SOLN

[Series 2: routine abd/pel with · axial · 0.66mm/px · z∈[-983,-613]mm · 13 of 82 slices shown, 15 images]
[im 4/82  soft-tissue]
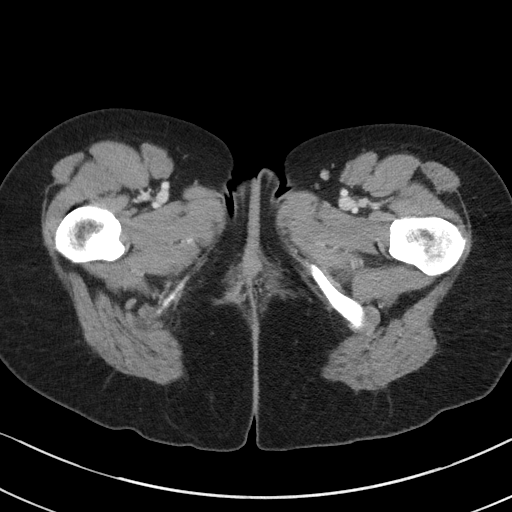
[im 4/82  bone]
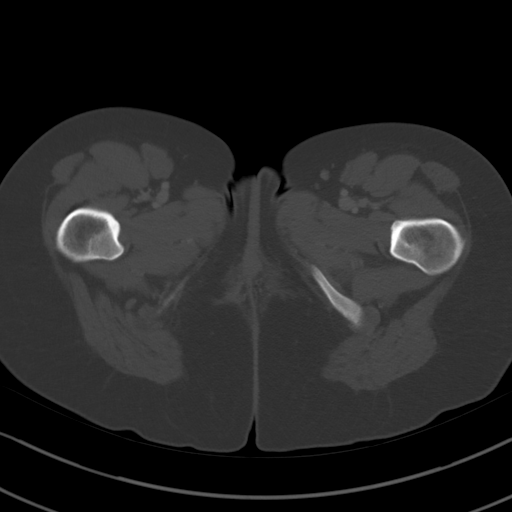
[im 10/82  soft-tissue]
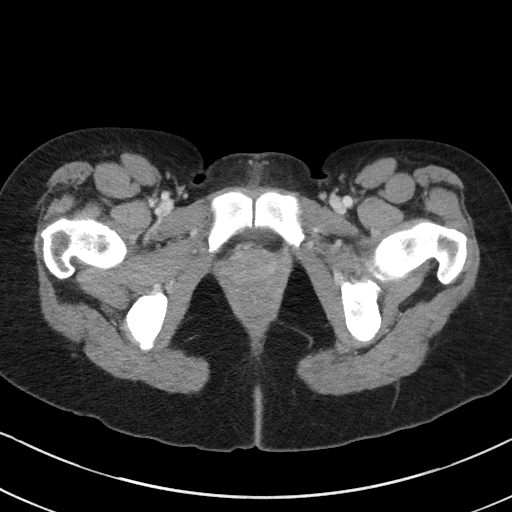
[im 16/82  soft-tissue]
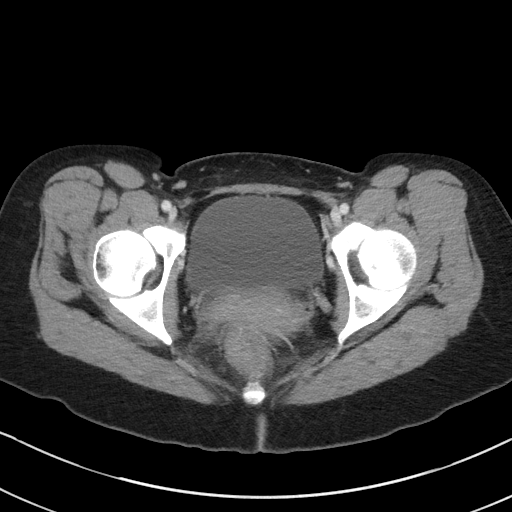
[im 22/82  soft-tissue]
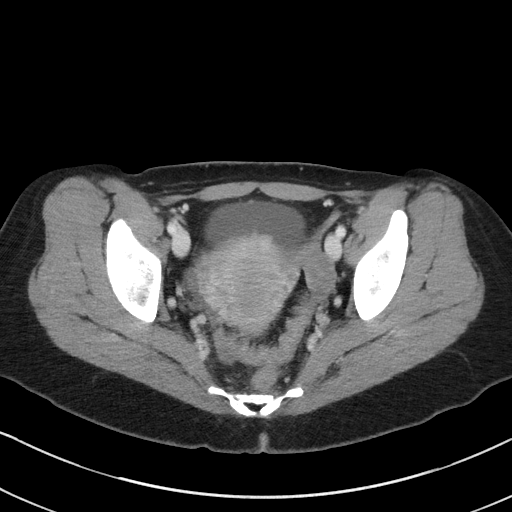
[im 29/82  soft-tissue]
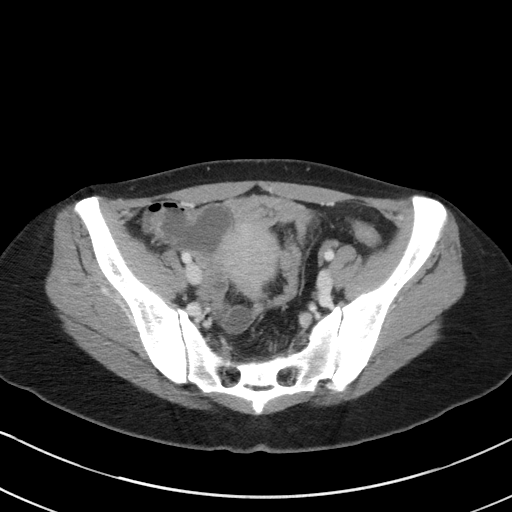
[im 35/82  soft-tissue]
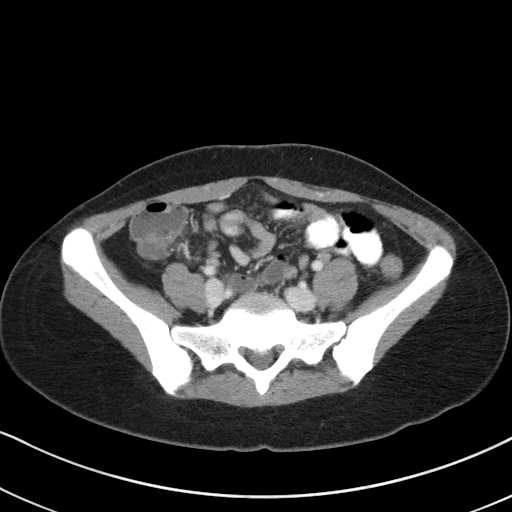
[im 41/82  soft-tissue]
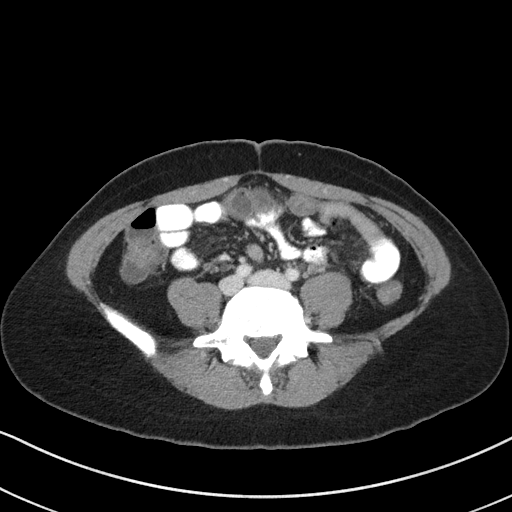
[im 47/82  soft-tissue]
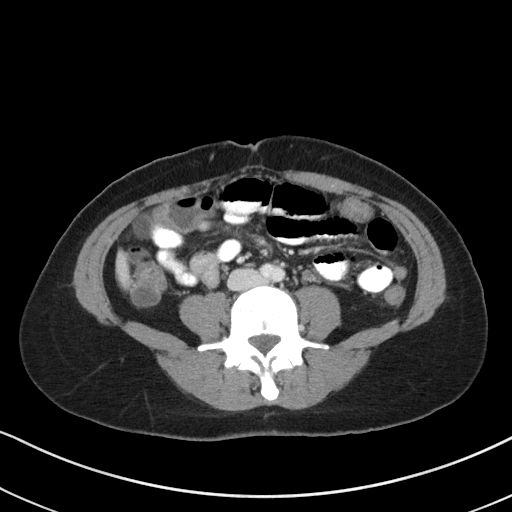
[im 53/82  soft-tissue]
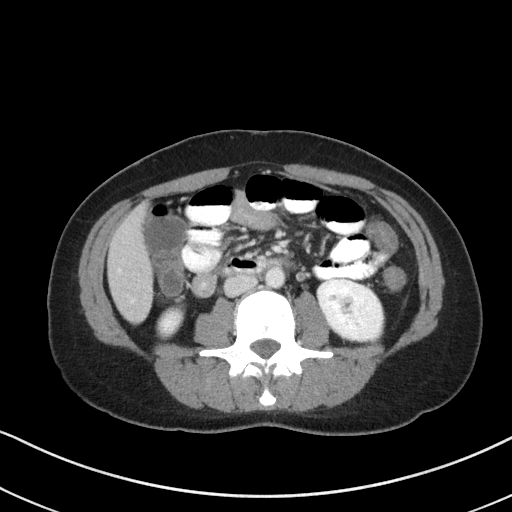
[im 53/82  bone]
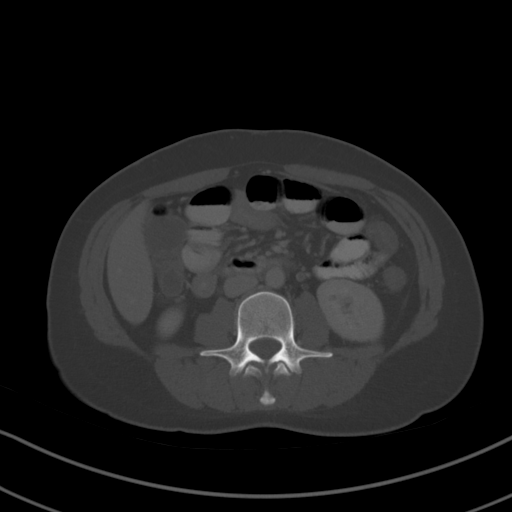
[im 60/82  soft-tissue]
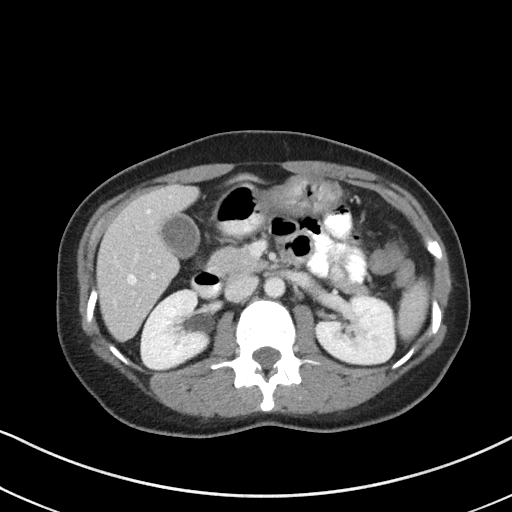
[im 66/82  soft-tissue]
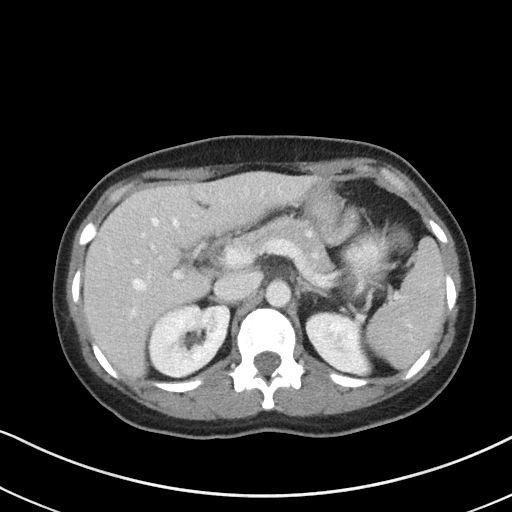
[im 72/82  soft-tissue]
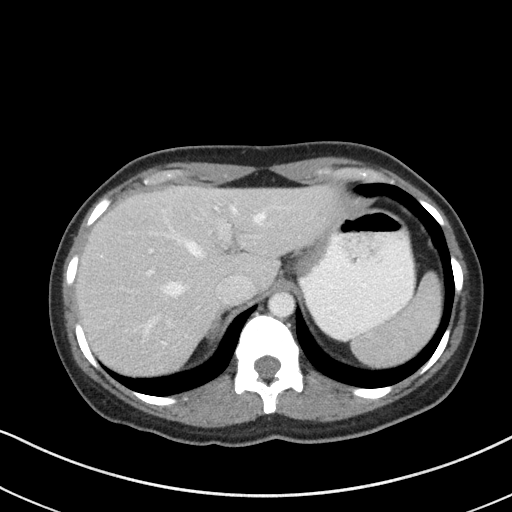
[im 78/82  soft-tissue]
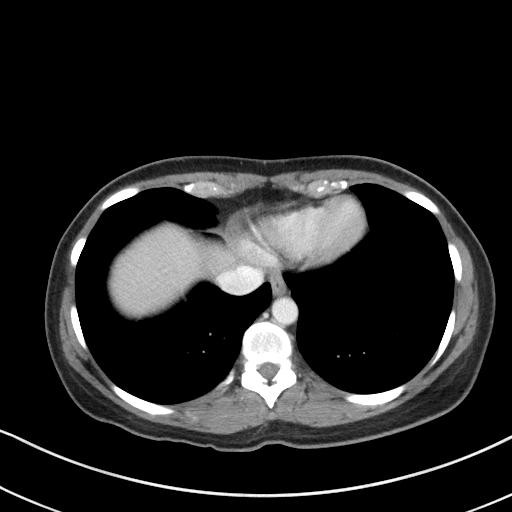

[Series 5: coronal st · coronal · 0.62mm/px · 3 of 69 slices shown]
[im 23/69  soft-tissue]
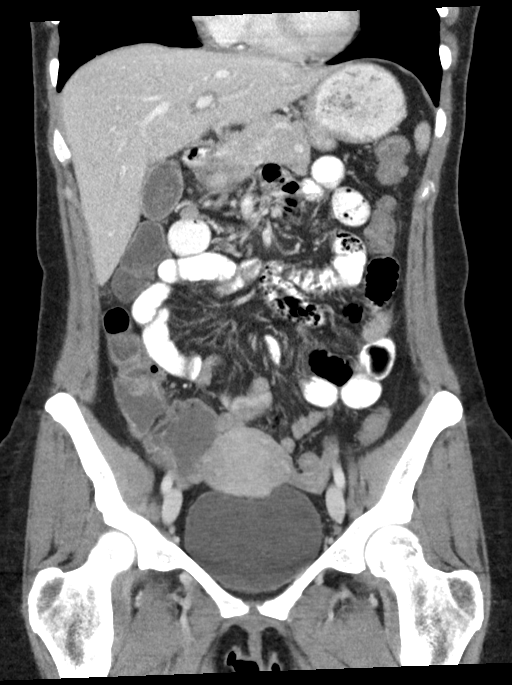
[im 31/69  soft-tissue]
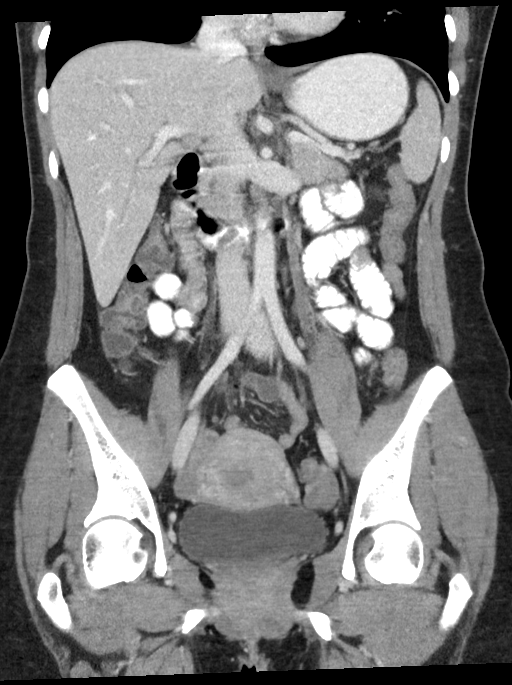
[im 38/69  soft-tissue]
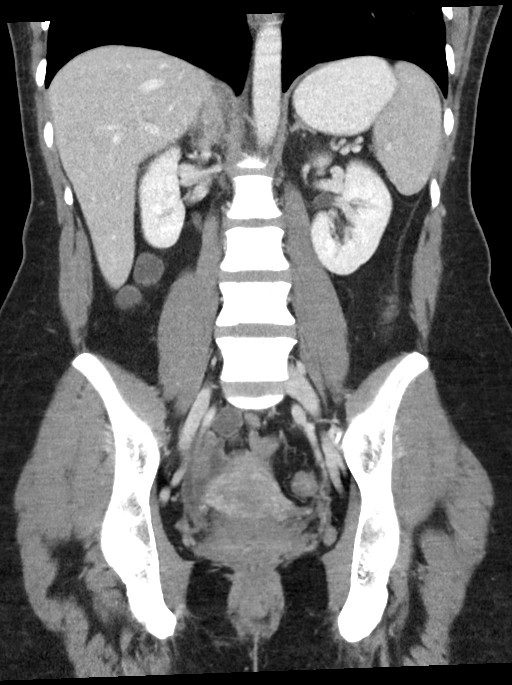

[16 of 46 positions shown; findings below may reference images not displayed]

FINDINGS: Lower chest:  No contributory findings.

Hepatobiliary: No focal liver abnormality.No evidence of biliary
obstruction or stone.

Pancreas: Unremarkable.

Spleen: Unremarkable.

Adrenals/Urinary Tract: Negative adrenals. No hydronephrosis or
stone. Unremarkable bladder.

Stomach/Bowel:  No obstruction. No appendicitis.

Vascular/Lymphatic: No acute vascular abnormality. No mass or
adenopathy.

Reproductive:Prominent size of the uterus, question adenomyosis.
Especially on sagittal reformats there may be a vague fibroid at the
anterior lower body measuring 15 mm. Negative ovaries.

Other: No ascites or pneumoperitoneum.

Musculoskeletal: No acute abnormalities.
IMPRESSION: 1. No acute finding or explanation for symptoms.
2. Possible 15 mm fibroid.

## 2019-07-15 MED ORDER — MORPHINE SULFATE (PF) 4 MG/ML IV SOLN
4.0000 mg | Freq: Once | INTRAVENOUS | Status: AC
  Administered 2019-07-15: 4 mg via INTRAVENOUS
  Filled 2019-07-15: qty 1

## 2019-07-15 MED ORDER — ONDANSETRON HCL 4 MG/2ML IJ SOLN
4.0000 mg | Freq: Once | INTRAMUSCULAR | Status: AC
  Administered 2019-07-15: 4 mg via INTRAVENOUS
  Filled 2019-07-15: qty 2

## 2019-07-15 MED ORDER — IOHEXOL 9 MG/ML PO SOLN
500.0000 mL | Freq: Two times a day (BID) | ORAL | Status: DC | PRN
  Administered 2019-07-15: 500 mL via ORAL

## 2019-07-15 MED ORDER — IOHEXOL 300 MG/ML  SOLN
100.0000 mL | Freq: Once | INTRAMUSCULAR | Status: AC | PRN
  Administered 2019-07-15: 100 mL via INTRAVENOUS

## 2019-07-15 MED ORDER — SODIUM CHLORIDE 0.9 % IV BOLUS
1000.0000 mL | Freq: Once | INTRAVENOUS | Status: AC
  Administered 2019-07-15: 1000 mL via INTRAVENOUS

## 2019-07-15 MED ORDER — DICYCLOMINE HCL 20 MG PO TABS: 20.0000 mg | ORAL_TABLET | Freq: Four times a day (QID) | ORAL | 0 refills | Status: DC

## 2019-07-15 MED ORDER — ONDANSETRON 4 MG PO TBDP: 4.0000 mg | ORAL_TABLET | Freq: Three times a day (TID) | ORAL | 0 refills | Status: DC | PRN

## 2019-07-15 NOTE — ED Notes (Signed)
Requested patient provide urine sample.

## 2019-07-15 NOTE — Discharge Instructions (Signed)
1.  You may take medicines as needed for abdominal cramps and nausea (Bentyl/Zofran #20). 2.  Liquids x12 hours, then [employer] x 2 days, then slowly advance diet as tolerated. 3.  Return to the ER for worsening symptoms, persistent vomiting, difficulty breathing or other concerns.

## 2019-07-15 NOTE — ED Notes (Signed)
Patient has gotten through at least 1 bottle of contrast. CT notified

## 2019-07-15 NOTE — ED Provider Notes (Signed)
[facility] Emergency Department Provider Note   ____________________________________________   First MD Initiated Contact with Patient 07/15/19 0215     (approximate)  I have reviewed the triage vital signs and the nursing notes.   HISTORY  Chief Complaint Abdominal Pain    HPI Danielle Maxwell is a 39 y.o. female who presents to the ED from home with a chief complaint abdominal pain, nausea/vomiting/diarrhea.  Patient reports symptoms intermittently x3 weeks.  Has been seen in another ED for same.  Has not had imaging studies.  Scheduled for lab draw for an upcoming GI visit in the morning.  Denies fever, cough, chest pain, shortness of breath, dysuria.  Denies recent travel, trauma or antibiotic use.       Past Medical History:  Diagnosis Date  . Abnormal Pap smear 2002  . Depression 2008  . H/O chlamydia infection   . H/O rubella   . H/O urinary frequency 09/22/2002  . History of varicella   . Hx: UTI (urinary tract infection) 04/2009  . Monilial vaginitis 06/12/2003  . NSVD (normal spontaneous vaginal delivery) 12/31/2011   2227  . Polyhydramnios in second trimester   . Yeast infection     Patient Active Problem List   Diagnosis Date Noted  . Acute diarrhea 06/25/2019  . GERD (gastroesophageal reflux disease) 03/20/2019  . Frequent headaches 01/14/2018  . Insomnia 01/14/2018  . Migraine 10/24/2017  . Preventative health care 03/09/2015  . Ganglion cyst of right foot 03/09/2015  . Plantar fasciitis, bilateral 02/11/2014  . Adjustment disorder with mixed anxiety and depressed mood 02/11/2014    Past Surgical History:  Procedure Laterality Date  . WISDOM TOOTH EXTRACTION      Prior to Admission medications   Medication Sig Start Date End Date Taking? Authorizing Provider  amitriptyline (ELAVIL) 10 MG tablet Take 1 tablet (10 mg total) by mouth at bedtime. For headache prevention. 03/20/19   Pleas Koch, NP  dicyclomine (BENTYL) 20  MG tablet Take 1 tablet (20 mg total) by mouth every 6 (six) hours. 07/15/19   Paulette Blanch, MD  escitalopram (LEXAPRO) 10 MG tablet Take 1 tablet (10 mg total) by mouth daily. For anxiety. 05/27/19   Pleas Koch, NP  esomeprazole (NEXIUM) 40 MG capsule Take 1 capsule (40 mg total) by mouth daily. For heartburn. 06/25/19   Pleas Koch, NP  fluticasone (FLONASE) 50 MCG/ACT nasal spray Place 2 sprays into both nostrils daily. Patient not taking: Reported on 07/09/2019 11/03/16   Tonia Ghent, MD  ondansetron (ZOFRAN ODT) 4 MG disintegrating tablet Take 1 tablet (4 mg total) by mouth every 8 (eight) hours as needed for nausea or vomiting. 07/15/19   Paulette Blanch, MD  promethazine (PHENERGAN) 25 MG tablet Take 1 tablet (25 mg total) by mouth every 8 (eight) hours as needed for nausea or vomiting (sedation caution). Patient not taking: Reported on 07/09/2019 03/20/19   Pleas Koch, NP  propranolol (INDERAL) 10 MG tablet Take 1-2 tablets (10-20 mg total) by mouth daily as needed. For anxiety. 06/30/19   Pleas Koch, NP  SUMAtriptan (IMITREX) 100 MG tablet Take 1 tablet (100 mg total) by mouth every 2 (two) hours as needed for migraine (max 2 doses in 24 hours). 10/23/17   Tonia Ghent, MD  Marilu Favre 150-35 MCG/24HR transdermal patch Place 1 patch onto the skin every Sunday.  05/23/19   [provider]    Allergies Patient has no known allergies.  Family History  Problem Relation Age of Onset  . Diabetes Father   . Hypertension Maternal Aunt   . Hypertension Maternal Grandmother   . Hypertension Maternal Grandfather   . Diabetes Maternal Grandfather   . Prostate cancer Maternal Grandfather   . Diabetes Paternal Grandmother   . Anesthesia problems Neg Hx   No history of IBD  Social History Social History   Tobacco Use  . Smoking status: Never Smoker  . Smokeless tobacco: Never Used  Substance Use Topics  . Alcohol use: Yes  . Drug use: No    Review of  Systems  Constitutional: No fever/chills Eyes: No visual changes. ENT: No sore throat. Cardiovascular: Denies chest pain. Respiratory: Denies shortness of breath. Gastrointestinal: Positive for abdominal pain, nausea, vomiting and diarrhea.  No constipation. Genitourinary: Negative for dysuria. Musculoskeletal: Negative for back pain. Skin: Negative for rash. Neurological: Negative for headaches, focal weakness or numbness.   ____________________________________________   PHYSICAL EXAM:  VITAL SIGNS: ED Triage Vitals  Enc Vitals Group     BP 07/14/19 2355 (!) 128/94     Pulse Rate 07/14/19 2355 88     Resp 07/14/19 2355 16     Temp 07/14/19 2355 98.4 F (36.9 C)     Temp Source 07/14/19 2355 Oral     SpO2 07/14/19 2355 100 %     Weight 07/14/19 2352 142 lb (64.4 kg)     Height 07/14/19 2352 5\' 3"  (1.6 m)     Head Circumference --      Peak Flow --      Pain Score 07/14/19 2352 10     Pain Loc --      Pain Edu? --      Excl. in GC? --     Constitutional: Alert and oriented. Well appearing and in mild acute distress. Eyes: Conjunctivae are normal. PERRL. EOMI. Head: Atraumatic. Nose: No congestion/rhinnorhea. Mouth/Throat: Mucous membranes are moist.  Oropharynx non-erythematous. Neck: No stridor.   Cardiovascular: Normal rate, regular rhythm. Grossly normal heart sounds.  Good peripheral circulation. Respiratory: Normal respiratory effort.  No retractions. Lungs CTAB. Gastrointestinal: Soft with mild diffuse tenderness to palpation without rebound or guarding. No distention. No abdominal bruits. No CVA tenderness. Musculoskeletal: No lower extremity tenderness nor edema.  No joint effusions. Neurologic:  Normal speech and language. No gross focal neurologic deficits are appreciated. No gait instability. Skin:  Skin is warm, dry and intact. No rash noted. Psychiatric: Mood and affect are normal. Speech and behavior are  normal.  ____________________________________________   LABS (all labs ordered are listed, but only abnormal results are displayed)  Labs Reviewed  COMPREHENSIVE METABOLIC PANEL - Abnormal; Notable for the following components:      Result Value   Potassium 3.4 (*)    CO2 20 (*)    Alkaline Phosphatase 35 (*)    All other components within normal limits  CBC - Abnormal; Notable for the following components:   RDW 17.5 (*)    All other components within normal limits  URINALYSIS, COMPLETE (UACMP) WITH MICROSCOPIC - Abnormal; Notable for the following components:   Color, Urine YELLOW (*)    APPearance CLEAR (*)    Hgb urine dipstick MODERATE (*)    Ketones, ur 80 (*)    Protein, ur 100 (*)    Leukocytes,Ua SMALL (*)    Bacteria, UA RARE (*)    All other components within normal limits  SARS CORONAVIRUS 2 (TAT 6-24 HRS)  LIPASE, BLOOD  POC  URINE PREG, ED  POCT PREGNANCY, URINE   ____________________________________________  EKG  None ____________________________________________  RADIOLOGY  ED MD interpretation: No acute intra-abdominal process; questionable vague fibroid  Official radiology report(s): CT Abdomen Pelvis W Contrast  Result Date: 07/15/2019 CLINICAL DATA:  Generalized abdominal pain EXAM: CT ABDOMEN AND PELVIS WITH CONTRAST TECHNIQUE: Multidetector CT imaging of the abdomen and pelvis was performed using the standard protocol following bolus administration of intravenous contrast. CONTRAST:  OMNIPAQUE IOHEXOL 300 MG/ML  SOLN COMPARISON:  None. FINDINGS: Lower chest:  No contributory findings. Hepatobiliary: No focal liver abnormality.No evidence of biliary obstruction or stone. Pancreas: Unremarkable. Spleen: Unremarkable. Adrenals/Urinary Tract: Negative adrenals. No hydronephrosis or stone. Unremarkable bladder. Stomach/Bowel:  No obstruction. No appendicitis. Vascular/Lymphatic: No acute vascular abnormality. No mass or adenopathy.  Reproductive:Prominent size of the uterus, question adenomyosis. Especially on sagittal reformats there may be a vague fibroid at the anterior lower body measuring 15 mm. Negative ovaries. Other: No ascites or pneumoperitoneum. Musculoskeletal: No acute abnormalities. IMPRESSION: 1. No acute finding or explanation for symptoms. 2. Possible 15 mm fibroid. Electronically Signed   By: Marnee Spring M.D.   On: 07/15/2019 04:25    ____________________________________________   PROCEDURES  Procedure(s) performed (including Critical Care):  Procedures   ____________________________________________   INITIAL IMPRESSION / ASSESSMENT AND PLAN / ED COURSE  As part of my medical decision making, I reviewed the following data within the electronic MEDICAL RECORD NUMBER Nursing notes reviewed and incorporated, Labs reviewed, Old chart reviewed, Radiograph reviewed and Notes from prior ED visits     Danielle Maxwell was evaluated in Emergency Department on 07/15/2019 for the symptoms described in the history of present illness. She was evaluated in the context of the global COVID-19 pandemic, which necessitated consideration that the patient might be at risk for infection with the SARS-CoV-2 virus that causes COVID-19. Institutional protocols and algorithms that pertain to the evaluation of patients at risk for COVID-19 are in a state of rapid change based on information released by regulatory bodies including the CDC and federal and state organizations. These policies and algorithms were followed during the patient's care in the ED.    39 year old female who presents with a 3-week history of generalized abdominal pain, nausea/vomiting/diarrhea. Differential diagnosis includes, but is not limited to, ovarian cyst, ovarian torsion, acute appendicitis, diverticulitis, urinary tract infection/pyelonephritis, endometriosis, bowel obstruction, colitis, renal colic, gastroenteritis, hernia, fibroids, endometriosis,  pregnancy related pain including ectopic pregnancy, etc.   Laboratory results largely unremarkable except for ketonuria.  Will initiate IV fluid resuscitation, IV Morphine for pain paired with IV Zofran for nausea.  Will proceed with CT abdomen/pelvis to evaluate for intra-abdominal etiology of patient's symptoms.   Clinical Course as of Jul 14 537  Tue Jul 15, 2019  0505 Updated patient on CT scan results.  She tolerated oral contrast without emesis.  Strict return precautions given.  Patient verbalizes understanding and agrees with plan of care.   [JS]    Clinical Course User Index [JS] Irean Hong, MD     ____________________________________________   FINAL CLINICAL IMPRESSION(S) / ED DIAGNOSES  Final diagnoses:  Generalized abdominal pain  Nausea vomiting and diarrhea  Dehydration     ED Discharge Orders         Ordered    dicyclomine (BENTYL) 20 MG tablet  Every 6 hours     07/15/19 0506    ondansetron (ZOFRAN ODT) 4 MG disintegrating tablet  Every 8 hours PRN     07/15/19  8889           Note:  This document was prepared using Dragon voice recognition software and may include unintentional dictation errors.   Irean Hong, MD 07/15/19 [phone]

## 2019-07-15 NOTE — ED Notes (Signed)
Patient transported to CT 

## 2019-07-16 DIAGNOSIS — R197 Diarrhea, unspecified: Secondary | ICD-10-CM | POA: Diagnosis not present

## 2019-07-16 DIAGNOSIS — R112 Nausea with vomiting, unspecified: Secondary | ICD-10-CM | POA: Diagnosis not present

## 2019-07-16 DIAGNOSIS — F4322 Adjustment disorder with anxiety: Secondary | ICD-10-CM | POA: Diagnosis not present

## 2019-07-16 DIAGNOSIS — K219 Gastro-esophageal reflux disease without esophagitis: Secondary | ICD-10-CM | POA: Diagnosis not present

## 2019-07-16 DIAGNOSIS — R1013 Epigastric pain: Secondary | ICD-10-CM | POA: Diagnosis not present

## 2019-07-16 DIAGNOSIS — K8020 Calculus of gallbladder without cholecystitis without obstruction: Secondary | ICD-10-CM | POA: Diagnosis not present

## 2019-07-16 DIAGNOSIS — R634 Abnormal weight loss: Secondary | ICD-10-CM | POA: Diagnosis not present

## 2019-07-16 DIAGNOSIS — R109 Unspecified abdominal pain: Secondary | ICD-10-CM | POA: Diagnosis not present

## 2019-07-17 DIAGNOSIS — K582 Mixed irritable bowel syndrome: Secondary | ICD-10-CM | POA: Diagnosis not present

## 2019-07-17 DIAGNOSIS — R1013 Epigastric pain: Secondary | ICD-10-CM | POA: Diagnosis not present

## 2019-07-17 DIAGNOSIS — K8020 Calculus of gallbladder without cholecystitis without obstruction: Secondary | ICD-10-CM | POA: Diagnosis not present

## 2019-07-17 DIAGNOSIS — R197 Diarrhea, unspecified: Secondary | ICD-10-CM | POA: Diagnosis not present

## 2019-07-17 DIAGNOSIS — R112 Nausea with vomiting, unspecified: Secondary | ICD-10-CM | POA: Diagnosis not present

## 2019-07-17 DIAGNOSIS — K219 Gastro-esophageal reflux disease without esophagitis: Secondary | ICD-10-CM | POA: Diagnosis not present

## 2019-07-17 HISTORY — PX: CHOLECYSTECTOMY: SHX55

## 2019-07-22 ENCOUNTER — Telehealth: Payer: Self-pay

## 2019-07-22 NOTE — Telephone Encounter (Signed)
Spoken and notified patient of Danielle Maxwell comments. Patient stated that she already have GI doctor. [facility] has referral her to a Careers adviser and she will be seeing them on Wednesday  Patient stated that she still has been chest pain and lightheadedness, it is about since last week

## 2019-07-22 NOTE — Telephone Encounter (Signed)
Noted  

## 2019-07-22 NOTE — Telephone Encounter (Signed)
[facility] Day - Client TELEPHONE ADVICE RECORD [facility] Patient Name: Danielle Maxwell Gender: Female DOB: 1980/10/20 Age: 39 Y 10 M 23 D Return Phone Number: [phone] (Primary), [phone] (Secondary) Address: [place] [address] Client [facility] Day - Client Client Site [facility] - Day Physician Vernona Rieger - NP Contact Type Call Who Is Calling Patient / Member / Family / Caregiver Call Type Triage / Clinical Relationship To Patient Self Return Phone Number [phone] (Primary) Chief Complaint CHEST PAIN (>=21 years) - pain, pressure, heaviness or tightness Reason for Call Symptomatic / Request for Health Information Initial Comment Caller states she is having chest pains and she is light headed. GOTO Facility Not Listed [facility] Translation No Nurse Assessment Nurse: Earlene Plater, RN, Lupita Leash Date/Time Lamount Cohen Time): 07/22/2019 8:16:08 AM Confirm and document reason for call. If symptomatic, describe symptoms. ---Caller states she is having intermittent chest pain- Pain 6/10 and she is light headed. Started Wednesday and he she did go to ER. Diagnosed with gallstones. No radiating pain. No fever, cough or SOB. Has the patient had close contact with a person known or suspected to have the novel coronavirus illness OR traveled / lives in area with major community spread (including international travel) in the last 14 days from the onset of symptoms? * If Asymptomatic, screen for exposure and travel within the last 14 days. ---No Does the patient have any new or worsening symptoms? ---Yes Will a triage be completed? ---Yes Related visit to physician within the last 2 weeks? ---Yes Does the PT have any chronic conditions? (i.e. diabetes, asthma, this includes High risk factors for pregnancy, etc.) ---No Is the patient pregnant or possibly pregnant? (Ask all females between  the ages of 18-55) ---No Is this a behavioral health or substance abuse call? ---No Guidelines Guideline Title Affirmed Question Affirmed Notes Nurse Date/Time (Eastern Time) Chest Pain [1] Chest pain (or "angina") comes and goes AND [2] is happening Reyes Ivan 07/22/2019 8:19:19 AM PLEASE NOTE: All timestamps contained within this report are represented as [place] Standard Time. CONFIDENTIALTY NOTICE: This fax transmission is intended only for the addressee. It contains information that is legally privileged, confidential or otherwise protected from use or disclosure. If you are not the intended recipient, you are strictly prohibited from reviewing, disclosing, copying using or disseminating any of this information or taking any action in reliance on or regarding this information. If you have received this fax in error, please notify us immediately by telephone so that we can arrange for its return to [place]. Phone: [phone], Toll-Free: [phone], Fax: [phone] Page: 2 of 2 Call Id: [id number] Guidelines Guideline Title Affirmed Question Affirmed Notes Nurse Date/Time Lamount Cohen Time) more often (increasing in frequency) or getting worse (increasing in severity) (Exception: chest pains that last only a few seconds) Disp. Time Lamount Cohen Time) Disposition Final User 07/22/2019 8:14:48 AM Send to Urgent Queue Gwenlyn Found 07/22/2019 8:23:30 AM Go to ED Now Yes Earlene Plater, RN, Oletta Cohn Disagree/Comply Comply Caller Understands Yes PreDisposition Call Doctor Care Advice Given Per Guideline GO TO ED NOW: * You need to be seen in the Emergency Department. ANOTHER ADULT SHOULD DRIVE: * It is better and safer if another adult drives instead of you. BRING MEDICINES: * Please bring a list of your current medicines when you go to the Emergency Department (ER). * It is also a good idea to bring the pill bottles too. This will help the doctor to  make certain you are taking the right  medicines and the right dose. NOTHING BY MOUTH: * Do not eat or drink anything for now. CALL EMS IF: * Severe difficulty breathing occurs * Passes out or becomes too weak to stand * You become worse. CARE ADVICE given per Chest Pain (Adult) guideline. Referrals GO TO FACILITY OTHER - SPECIFY

## 2019-07-22 NOTE — Telephone Encounter (Signed)
Noted, does she have a GI doctor that she's established with? It looks like the doctor at [facility] wanted her to see her GI doc.

## 2019-07-23 DIAGNOSIS — Z01812 Encounter for preprocedural laboratory examination: Secondary | ICD-10-CM | POA: Diagnosis not present

## 2019-07-23 DIAGNOSIS — Z6822 Body mass index (BMI) 22.0-22.9, adult: Secondary | ICD-10-CM | POA: Diagnosis not present

## 2019-07-23 DIAGNOSIS — Z20822 Contact with and (suspected) exposure to covid-19: Secondary | ICD-10-CM | POA: Diagnosis not present

## 2019-07-23 DIAGNOSIS — K8020 Calculus of gallbladder without cholecystitis without obstruction: Secondary | ICD-10-CM | POA: Diagnosis not present

## 2019-07-25 ENCOUNTER — Ambulatory Visit: Payer: BC Managed Care – PPO | Admitting: Psychology

## 2019-07-25 DIAGNOSIS — F4322 Adjustment disorder with anxiety: Secondary | ICD-10-CM | POA: Diagnosis not present

## 2019-07-25 DIAGNOSIS — G4700 Insomnia, unspecified: Secondary | ICD-10-CM | POA: Diagnosis not present

## 2019-07-25 DIAGNOSIS — Z79899 Other long term (current) drug therapy: Secondary | ICD-10-CM | POA: Diagnosis not present

## 2019-07-25 DIAGNOSIS — K8020 Calculus of gallbladder without cholecystitis without obstruction: Secondary | ICD-10-CM | POA: Diagnosis not present

## 2019-07-25 DIAGNOSIS — E079 Disorder of thyroid, unspecified: Secondary | ICD-10-CM | POA: Diagnosis not present

## 2019-07-25 DIAGNOSIS — K8010 Calculus of gallbladder with chronic cholecystitis without obstruction: Secondary | ICD-10-CM | POA: Diagnosis not present

## 2019-07-25 DIAGNOSIS — K219 Gastro-esophageal reflux disease without esophagitis: Secondary | ICD-10-CM | POA: Diagnosis not present

## 2019-07-25 DIAGNOSIS — R197 Diarrhea, unspecified: Secondary | ICD-10-CM | POA: Diagnosis not present

## 2019-08-04 DIAGNOSIS — R7989 Other specified abnormal findings of blood chemistry: Secondary | ICD-10-CM

## 2019-08-07 ENCOUNTER — Other Ambulatory Visit (INDEPENDENT_AMBULATORY_CARE_PROVIDER_SITE_OTHER): Payer: BC Managed Care – PPO

## 2019-08-07 ENCOUNTER — Other Ambulatory Visit: Payer: Self-pay

## 2019-08-07 DIAGNOSIS — R7989 Other specified abnormal findings of blood chemistry: Secondary | ICD-10-CM | POA: Diagnosis not present

## 2019-08-07 LAB — T4, FREE: Free T4: 0.68 ng/dL (ref 0.60–1.60)

## 2019-08-07 LAB — TSH: TSH: 3.21 u[IU]/mL (ref 0.35–4.50)

## 2019-08-08 ENCOUNTER — Ambulatory Visit: Payer: BC Managed Care – PPO | Admitting: Psychology

## 2019-08-11 DIAGNOSIS — R300 Dysuria: Secondary | ICD-10-CM | POA: Diagnosis not present

## 2019-08-14 DIAGNOSIS — K5904 Chronic idiopathic constipation: Secondary | ICD-10-CM | POA: Diagnosis not present

## 2019-08-19 ENCOUNTER — Ambulatory Visit: Payer: BC Managed Care – PPO | Admitting: Gastroenterology

## 2019-09-02 DIAGNOSIS — G43901 Migraine, unspecified, not intractable, with status migrainosus: Secondary | ICD-10-CM

## 2019-09-02 MED ORDER — BUTALBITAL-APAP-CAFFEINE 50-325-40 MG PO TABS: 1.0000 | ORAL_TABLET | Freq: Two times a day (BID) | ORAL | 0 refills | Status: DC | PRN

## 2019-09-02 NOTE — Telephone Encounter (Signed)
Last prescribed on 10/27/2017. Last OV on 06/25/2019. No future OV scheduled.

## 2020-02-01 ENCOUNTER — Other Ambulatory Visit: Payer: Self-pay | Admitting: Primary Care

## 2020-02-01 DIAGNOSIS — F4323 Adjustment disorder with mixed anxiety and depressed mood: Secondary | ICD-10-CM

## 2020-02-02 ENCOUNTER — Other Ambulatory Visit: Payer: Self-pay

## 2020-02-02 DIAGNOSIS — F4323 Adjustment disorder with mixed anxiety and depressed mood: Secondary | ICD-10-CM

## 2020-02-02 MED ORDER — ESCITALOPRAM OXALATE 10 MG PO TABS: 10.0000 mg | ORAL_TABLET | Freq: Every day | ORAL | 0 refills | Status: DC

## 2020-03-02 DIAGNOSIS — Z6826 Body mass index (BMI) 26.0-26.9, adult: Secondary | ICD-10-CM | POA: Diagnosis not present

## 2020-03-02 DIAGNOSIS — Z3040 Encounter for surveillance of contraceptives, unspecified: Secondary | ICD-10-CM | POA: Diagnosis not present

## 2020-03-02 DIAGNOSIS — Z01419 Encounter for gynecological examination (general) (routine) without abnormal findings: Secondary | ICD-10-CM | POA: Diagnosis not present

## 2020-03-04 ENCOUNTER — Other Ambulatory Visit: Payer: Self-pay

## 2020-05-03 LAB — HM MAMMOGRAPHY

## 2020-05-05 ENCOUNTER — Other Ambulatory Visit: Payer: BC Managed Care – PPO

## 2020-05-05 DIAGNOSIS — Z20822 Contact with and (suspected) exposure to covid-19: Secondary | ICD-10-CM

## 2020-05-07 LAB — NOVEL CORONAVIRUS, NAA

## 2020-05-24 ENCOUNTER — Ambulatory Visit: Payer: BC Managed Care – PPO | Admitting: Primary Care

## 2020-05-24 ENCOUNTER — Other Ambulatory Visit: Payer: Self-pay

## 2020-05-24 ENCOUNTER — Encounter: Payer: Self-pay | Admitting: Primary Care

## 2020-05-24 DIAGNOSIS — G4700 Insomnia, unspecified: Secondary | ICD-10-CM | POA: Diagnosis not present

## 2020-05-24 DIAGNOSIS — G43901 Migraine, unspecified, not intractable, with status migrainosus: Secondary | ICD-10-CM

## 2020-05-24 DIAGNOSIS — F4323 Adjustment disorder with mixed anxiety and depressed mood: Secondary | ICD-10-CM | POA: Diagnosis not present

## 2020-05-24 DIAGNOSIS — R519 Headache, unspecified: Secondary | ICD-10-CM

## 2020-05-24 MED ORDER — SUMATRIPTAN SUCCINATE 100 MG PO TABS: ORAL_TABLET | ORAL | 0 refills | Status: DC

## 2020-05-24 MED ORDER — KETOROLAC TROMETHAMINE 60 MG/2ML IM SOLN
60.0000 mg | Freq: Once | INTRAMUSCULAR | Status: AC
  Administered 2020-05-24: 60 mg via INTRAMUSCULAR

## 2020-05-24 MED ORDER — ESCITALOPRAM OXALATE 10 MG PO TABS: 10.0000 mg | ORAL_TABLET | Freq: Every day | ORAL | 3 refills | Status: DC

## 2020-05-24 NOTE — Assessment & Plan Note (Signed)
Doing well on Lexapro 10 mg, continue same. Refills provided.

## 2020-05-24 NOTE — Addendum Note (Signed)
Addended by: Donnamarie Poag on: 05/24/2020 09:38 AM   Modules accepted: Orders

## 2020-05-24 NOTE — Progress Notes (Signed)
Subjective:    Patient ID: Danielle Maxwell, female    DOB: [birth year], 40 y.o.   MRN: [id number]  HPI  This visit occurred during the SARS-CoV-2 public health emergency.  Safety protocols were in place, including screening questions prior to the visit, additional usage of staff PPE, and extensive cleaning of exam room while observing appropriate contact time as indicated for disinfecting solutions.   Danielle Maxwell is a 40 year old female with a history of migraines, anxiety and depression, GERD, insomnia who presents today for medication refills/follow up and a chief complaint of otalgia and headaches.   1) Migraines/Frequent Headaches: Chronic. Currently managed on amitriptyline 10 mg nightly for preventative treatment, sumatriptan 100 mg PRN for abortive treatment.   Break through migraines occur 1-2 times monthly on average. One week ago she woke up to left ear and jaw pain. A few days later she developed a headache behind her eyes and nose. She thought she may have had a sinus infection so she started taking Sinus OTC meds. Yesterday she woke up and her headache had progressed. She took Ibuprofen without improvement, she is out of sumatriptan. She does feel that the amitriptyline 10 mg is helping to prevent headaches, her migraine frequency has reduced.   One month ago her husband tested positive for Covid-19. She tested negative for Covid-19 three weeks ago, she never tested positive for Covid when her husband was ill. She's had two vaccines for Covid-19.    2) GAD: Currently managed on Lexapro 10 mg and feels well managed on this regimen. She isn't sleeping well at night.   Review of Systems  Constitutional: Negative for fever.  HENT: Positive for congestion and ear pain.   Respiratory: Negative for cough.   Cardiovascular: Negative for chest pain.  Neurological: Positive for headaches.  Psychiatric/Behavioral: Positive for sleep disturbance. The patient is not nervous/anxious.         Past Medical History:  Diagnosis Date  . Abnormal Pap smear 2002  . Depression 2008  . H/O chlamydia infection   . H/O rubella   . H/O urinary frequency 09/22/2002  . History of varicella   . Hx: UTI (urinary tract infection) 04/2009  . Monilial vaginitis 06/12/2003  . NSVD (normal spontaneous vaginal delivery) 12/31/2011   2227  . Polyhydramnios in second trimester   . Yeast infection      Social History   Socioeconomic History  . Marital status: Married    Spouse name: Not on file  . Number of children: Not on file  . Years of education: Not on file  . Highest education level: Not on file  Occupational History  . Not on file  Tobacco Use  . Smoking status: Never Smoker  . Smokeless tobacco: Never Used  Vaping Use  . Vaping Use: Never used  Substance and Sexual Activity  . Alcohol use: Yes  . Drug use: No  . Sexual activity: Yes    Partners: Male    Birth control/protection: None  Other Topics Concern  . Not on file  Social History Narrative   Married.   2 children.   Works at [employer].   Enjoys reading.    Social Determinants of Health   Financial Resource Strain: Not on file  Food Insecurity: Not on file  Transportation Needs: Not on file  Physical Activity: Not on file  Stress: Not on file  Social Connections: Not on file  Intimate Partner Violence: Not on file    Past Surgical  History:  Procedure Laterality Date  . CHOLECYSTECTOMY  07/17/2019  . WISDOM TOOTH EXTRACTION      Family History  Problem Relation Age of Onset  . Diabetes Father   . Hypertension Maternal Aunt   . Hypertension Maternal Grandmother   . Hypertension Maternal Grandfather   . Diabetes Maternal Grandfather   . Prostate cancer Maternal Grandfather   . Diabetes Paternal Grandmother   . Anesthesia problems Neg Hx     No Known Allergies  Current Outpatient Medications on File Prior to Visit  Medication Sig Dispense Refill  . amitriptyline (ELAVIL) 10 MG tablet Take 1 tablet  (10 mg total) by mouth at bedtime. For headache prevention. 90 tablet 3  . ondansetron (ZOFRAN ODT) 4 MG disintegrating tablet Take 1 tablet (4 mg total) by mouth every 8 (eight) hours as needed for nausea or vomiting. 20 tablet 0  . XULANE 150-35 MCG/24HR transdermal patch Place 1 patch onto the skin every Sunday.      No current facility-administered medications on file prior to visit.    BP 124/76   Pulse 89   Temp 98.1 F (36.7 C) (Temporal)   Ht 5\' 3"  (1.6 m)   Wt 159 lb (72.1 kg)   SpO2 100%   BMI 28.17 kg/m    Objective:   Physical Exam Constitutional:      Appearance: She is well-nourished. She is not ill-appearing.  HENT:     Right Ear: Tympanic membrane and ear canal normal.     Left Ear: Tympanic membrane and ear canal normal.  Cardiovascular:     Rate and Rhythm: Normal rate and regular rhythm.  Pulmonary:     Effort: Pulmonary effort is normal.     Breath sounds: Normal breath sounds.  Musculoskeletal:     Cervical back: Neck supple.  Skin:    General: Skin is warm and dry.  Psychiatric:        Mood and Affect: Mood and affect normal.            Assessment & Plan:

## 2020-05-24 NOTE — Assessment & Plan Note (Addendum)
Reduction in frequency on nightly amitriptyline 10 mg, but does have 1-2 migraines monthly.  Discussed a dose increase of amitriptyline to 20 mg by stepping up to 15 mg nightly x 2 weeks, then increase to 20 mg. She will try this and update. If she does well then needs an updated Rx.  Refill provided for sumatriptan 100 mg for abortive treatment. IM toradol 60 mg provided for headache today. Will swab for Covid-19 today to rule out.

## 2020-05-24 NOTE — Patient Instructions (Addendum)
Increase your dose of amitriptyline to 15 mg, take 1 and 1/2 tablet daily for 2 weeks, then increase to 2 full tablets thereafter. Please update me regarding this dose!  You can take 1 tablet of sumatriptan 100 mg at migraine onset. This is only for abortive treatment.  It was a pleasure to see you today!

## 2020-05-24 NOTE — Assessment & Plan Note (Signed)
Improved on nightly amitriptyline 10 mg, migraines occurring 1-2 times monthly.  Will increase dose to 15 mg nightly x 2 weeks, then to 20 mg thereafter. She will update.

## 2020-05-25 LAB — SARS-COV-2, NAA 2 DAY TAT

## 2020-05-25 LAB — NOVEL CORONAVIRUS, NAA: SARS-CoV-2, NAA: NOT DETECTED

## 2020-06-30 ENCOUNTER — Other Ambulatory Visit: Payer: Self-pay

## 2020-06-30 ENCOUNTER — Ambulatory Visit (INDEPENDENT_AMBULATORY_CARE_PROVIDER_SITE_OTHER): Payer: BC Managed Care – PPO | Admitting: Primary Care

## 2020-06-30 ENCOUNTER — Other Ambulatory Visit (INDEPENDENT_AMBULATORY_CARE_PROVIDER_SITE_OTHER): Payer: BC Managed Care – PPO

## 2020-06-30 ENCOUNTER — Encounter: Payer: Self-pay | Admitting: Primary Care

## 2020-06-30 VITALS — BP 118/64 | HR 91 | Temp 97.5°F | Ht 63.0 in | Wt 160.0 lb

## 2020-06-30 DIAGNOSIS — G4700 Insomnia, unspecified: Secondary | ICD-10-CM

## 2020-06-30 DIAGNOSIS — R519 Headache, unspecified: Secondary | ICD-10-CM | POA: Diagnosis not present

## 2020-06-30 DIAGNOSIS — R7989 Other specified abnormal findings of blood chemistry: Secondary | ICD-10-CM

## 2020-06-30 DIAGNOSIS — G43901 Migraine, unspecified, not intractable, with status migrainosus: Secondary | ICD-10-CM | POA: Diagnosis not present

## 2020-06-30 DIAGNOSIS — Z0000 Encounter for general adult medical examination without abnormal findings: Secondary | ICD-10-CM

## 2020-06-30 DIAGNOSIS — K219 Gastro-esophageal reflux disease without esophagitis: Secondary | ICD-10-CM

## 2020-06-30 DIAGNOSIS — F4323 Adjustment disorder with mixed anxiety and depressed mood: Secondary | ICD-10-CM

## 2020-06-30 LAB — LIPID PANEL
Cholesterol: 182 mg/dL (ref 0–200)
HDL: 61.6 mg/dL (ref >39.00)
LDL Cholesterol: 98 mg/dL (ref 0–99)
NonHDL: 120.28
Total CHOL/HDL Ratio: 3
Triglycerides: 111 mg/dL (ref 0.0–149.0)
VLDL: 22.2 mg/dL (ref 0.0–40.0)

## 2020-06-30 LAB — CBC
HCT: 34.5 % — ABNORMAL LOW (ref 36.0–46.0)
Hemoglobin: 11.1 g/dL — ABNORMAL LOW (ref 12.0–15.0)
MCHC: 32.2 g/dL (ref 30.0–36.0)
MCV: 82.3 fl (ref 78.0–100.0)
Platelets: 305 10*3/uL (ref 150.0–400.0)
RBC: 4.2 Mil/uL (ref 3.87–5.11)
RDW: 18.4 % — ABNORMAL HIGH (ref 11.5–15.5)
WBC: 6 10*3/uL (ref 4.0–10.5)

## 2020-06-30 LAB — COMPREHENSIVE METABOLIC PANEL
ALT: 8 U/L (ref 0–35)
AST: 13 U/L (ref 0–37)
Albumin: 3.8 g/dL (ref 3.5–5.2)
Alkaline Phosphatase: 36 U/L — ABNORMAL LOW (ref 39–117)
BUN: 5 mg/dL — ABNORMAL LOW (ref 6–23)
CO2: 26 mEq/L (ref 19–32)
Calcium: 8.9 mg/dL (ref 8.4–10.5)
Chloride: 103 mEq/L (ref 96–112)
Creatinine, Ser: 0.77 mg/dL (ref 0.40–1.20)
GFR: 96.89 mL/min (ref >60.00)
Glucose, Bld: 80 mg/dL (ref 70–99)
Potassium: 3.7 mEq/L (ref 3.5–5.1)
Sodium: 137 mEq/L (ref 135–145)
Total Bilirubin: 0.4 mg/dL (ref 0.2–1.2)
Total Protein: 7 g/dL (ref 6.0–8.3)

## 2020-06-30 LAB — T4, FREE: Free T4: 0.85 ng/dL (ref 0.60–1.60)

## 2020-06-30 LAB — TSH: TSH: 4.52 u[IU]/mL — ABNORMAL HIGH (ref 0.35–4.50)

## 2020-06-30 MED ORDER — AMITRIPTYLINE HCL 10 MG PO TABS: 15.0000 mg | ORAL_TABLET | Freq: Every day | ORAL | 0 refills | Status: DC

## 2020-06-30 MED ORDER — ONDANSETRON 4 MG PO TBDP: 4.0000 mg | ORAL_TABLET | Freq: Three times a day (TID) | ORAL | 0 refills | Status: DC | PRN

## 2020-06-30 NOTE — Assessment & Plan Note (Signed)
Improved with Lexapro 10 mg, continue same.

## 2020-06-30 NOTE — Assessment & Plan Note (Signed)
Immunizations up-to-date. Pap smear up-to-date, follows with GYN. Mammogram due at age 40.  Encouraged healthy diet and regular exercise.  Exam today stable. Labs pending.

## 2020-06-30 NOTE — Assessment & Plan Note (Signed)
Compliant to amitriptyline 15 mg at this point, 20 mg caused too much drowsiness the second day.   She will continue to monitor and update migraines, she's unsure if the 15 mg dose increase has been effective given the recent personal/stressors.   She will update.

## 2020-06-30 NOTE — Patient Instructions (Signed)
Stop by the lab prior to leaving today. I will notify you of your results once received.   Keep me updated regarding your migraines/frequent headaches.   Continue exercising. You should be getting 150 minutes of moderate intensity exercise weekly.  It's important to improve your diet by reducing consumption of fast food, fried food, processed snack foods, sugary drinks. Increase consumption of fresh vegetables and fruits, whole grains, water.  Ensure you are drinking 64 ounces of water daily.  It was a pleasure to see you today!   Preventive Care 54-2 Years Old, Female Preventive care refers to lifestyle choices and visits with your health care provider that can promote health and wellness. This includes:  A yearly physical exam. This is also called an annual wellness visit.  Regular dental and eye exams.  Immunizations.  Screening for certain conditions.  Healthy lifestyle choices, such as: ? Eating a healthy diet. ? Getting regular exercise. ? Not using drugs or products that contain nicotine and tobacco. ? Limiting alcohol use. What can I expect for my preventive care visit? Physical exam Your health care provider may check your:  Height and weight. These may be used to calculate your BMI (body mass index). BMI is a measurement that tells if you are at a healthy weight.  Heart rate and blood pressure.  Body temperature.  Skin for abnormal spots. Counseling Your health care provider may ask you questions about your:  Past medical problems.  Family's medical history.  Alcohol, tobacco, and drug use.  Emotional well-being.  Home life and relationship well-being.  Sexual activity.  Diet, exercise, and sleep habits.  Work and work Statistician.  Access to firearms.  Method of birth control.  Menstrual cycle.  Pregnancy history. What immunizations do I need? Vaccines are usually given at various ages, according to a schedule. Your health care provider  will recommend vaccines for you based on your age, medical history, and lifestyle or other factors, such as travel or where you work.   What tests do I need? Blood tests  Lipid and cholesterol levels. These may be checked every 5 years starting at age 61.  Hepatitis C test.  Hepatitis B test. Screening  Diabetes screening. This is done by checking your blood sugar (glucose) after you have not eaten for a while (fasting).  STD (sexually transmitted disease) testing, if you are at risk.  BRCA-related cancer screening. This may be done if you have a family history of breast, ovarian, tubal, or peritoneal cancers.  Pelvic exam and Pap test. This may be done every 3 years starting at age 72. Starting at age 46, this may be done every 5 years if you have a Pap test in combination with an HPV test. Talk with your health care provider about your test results, treatment options, and if necessary, the need for more tests.   Follow these instructions at home: Eating and drinking  Eat a healthy diet that includes fresh fruits and vegetables, whole grains, lean protein, and low-fat dairy products.  Take vitamin and mineral supplements as recommended by your health care provider.  Do not drink alcohol if: ? Your health care provider tells you not to drink. ? You are pregnant, may be pregnant, or are planning to become pregnant.  If you drink alcohol: ? Limit how much you have to 0-1 drink a day. ? Be aware of how much alcohol is in your drink. In the [place] one drink equals one 12 oz bottle of beer (355  mL), one 5 oz glass of wine (148 mL), or one 1 oz glass of hard liquor (44 mL).   Lifestyle  Take daily care of your teeth and gums. Brush your teeth every morning and night with fluoride toothpaste. Floss one time each day.  Stay active. Exercise for at least 30 minutes 5 or more days each week.  Do not use any products that contain nicotine or tobacco, such as cigarettes, e-cigarettes, and  chewing tobacco. If you need help quitting, ask your health care provider.  Do not use drugs.  If you are sexually active, practice safe sex. Use a condom or other form of protection to prevent STIs (sexually transmitted infections).  If you do not wish to become pregnant, use a form of birth control. If you plan to become pregnant, see your health care provider for a prepregnancy visit.  Find healthy ways to cope with stress, such as: ? Meditation, yoga, or listening to music. ? Journaling. ? Talking to a trusted person. ? Spending time with friends and family. Safety  Always wear your seat belt while driving or riding in a vehicle.  Do not drive: ? If you have been drinking alcohol. Do not ride with someone who has been drinking. ? When you are tired or distracted. ? While texting.  Wear a helmet and other protective equipment during sports activities.  If you have firearms in your house, make sure you follow all gun safety procedures.  Seek help if you have been physically or sexually abused. What's next?  Go to your health care provider once a year for an annual wellness visit.  Ask your health care provider how often you should have your eyes and teeth checked.  Stay up to date on all vaccines. This information is not intended to replace advice given to you by your health care provider. Make sure you discuss any questions you have with your health care provider. Document Revised: 11/30/2019 Document Reviewed: 12/13/2017 Elsevier Patient Education  2021 [employer].

## 2020-06-30 NOTE — Progress Notes (Signed)
Subjective:    Patient ID: Danielle Maxwell, female    DOB: 12/11/1980, 40 y.o.   MRN: [id number]  HPI  Danielle Maxwell is a very pleasant 40 y.o. female who presents today for complete physical.  Immunizations: -Tetanus: 2013 -Influenza: Completed this season  -Covid-19: Completed two vaccines.  Diet: She endorses a poor diet, increased sweets Exercise: She is exercising and walking during the weekends, walking on the tread mill.   Eye exam: Completes annually  Dental exam: Completes semi-annually    Pap Smear: UTD, follows with GYN.    Review of Systems  Constitutional: Negative for unexpected weight change.  HENT: Negative for rhinorrhea.   Respiratory: Negative for cough and shortness of breath.   Cardiovascular: Negative for chest pain.  Gastrointestinal: Negative for constipation and diarrhea.  Genitourinary: Negative for difficulty urinating.  Musculoskeletal: Positive for arthralgias. Negative for myalgias.       Chronic arthralgias to bilateral hands secondary to typing at work.  Skin: Negative for rash.  Allergic/Immunologic: Negative for environmental allergies.  Neurological: Positive for headaches. Negative for dizziness and numbness.  Psychiatric/Behavioral:       Increased stress with the unexpected passing of her father, overall feels well managed on Lexapro         Past Medical History:  Diagnosis Date  . Abnormal Pap smear 2002  . Depression 2008  . H/O chlamydia infection   . H/O rubella   . H/O urinary frequency 09/22/2002  . History of varicella   . Hx: UTI (urinary tract infection) 04/2009  . Monilial vaginitis 06/12/2003  . NSVD (normal spontaneous vaginal delivery) 12/31/2011   2227  . Polyhydramnios in second trimester   . Yeast infection     Social History   Socioeconomic History  . Marital status: Married    Spouse name: Not on file  . Number of children: Not on file  . Years of education: Not on file  . Highest education level: Not on  file  Occupational History  . Not on file  Tobacco Use  . Smoking status: Never Smoker  . Smokeless tobacco: Never Used  Vaping Use  . Vaping Use: Never used  Substance and Sexual Activity  . Alcohol use: Yes  . Drug use: No  . Sexual activity: Yes    Partners: Male    Birth control/protection: None  Other Topics Concern  . Not on file  Social History Narrative   Married.   2 children.   Works at [employer].   Enjoys reading.    Social Determinants of Health   Financial Resource Strain: Not on file  Food Insecurity: Not on file  Transportation Needs: Not on file  Physical Activity: Not on file  Stress: Not on file  Social Connections: Not on file  Intimate Partner Violence: Not on file    Past Surgical History:  Procedure Laterality Date  . CHOLECYSTECTOMY  07/17/2019  . WISDOM TOOTH EXTRACTION      Family History  Problem Relation Age of Onset  . Diabetes Father   . Hypertension Maternal Aunt   . Hypertension Maternal Grandmother   . Hypertension Maternal Grandfather   . Diabetes Maternal Grandfather   . Prostate cancer Maternal Grandfather   . Diabetes Paternal Grandmother   . Anesthesia problems Neg Hx     No Known Allergies  Current Outpatient Medications on File Prior to Visit  Medication Sig Dispense Refill  . amitriptyline (ELAVIL) 10 MG tablet Take 1 tablet (10 mg  total) by mouth at bedtime. For headache prevention. 90 tablet 3  . escitalopram (LEXAPRO) 10 MG tablet Take 1 tablet (10 mg total) by mouth daily. For anxiety. 90 tablet 3  . ondansetron (ZOFRAN ODT) 4 MG disintegrating tablet Take 1 tablet (4 mg total) by mouth every 8 (eight) hours as needed for nausea or vomiting. 20 tablet 0  . SUMAtriptan (IMITREX) 100 MG tablet Take 1 tablet by mouth at migraine onset. May repeat in 2 hours if migraines persist. 10 tablet 0  . XULANE 150-35 MCG/24HR transdermal patch Place 1 patch onto the skin every Sunday.      No current facility-administered  medications on file prior to visit.    BP 118/64   Pulse 91   Temp (!) 97.5 F (36.4 C) (Temporal)   Ht 5\' 3"  (1.6 m)   Wt 160 lb (72.6 kg)   SpO2 99%   BMI 28.34 kg/m  Objective:   Physical Exam HENT:     Right Ear: Tympanic membrane and ear canal normal.     Left Ear: Tympanic membrane and ear canal normal.     Nose: Nose normal.  Eyes:     Conjunctiva/sclera: Conjunctivae normal.     Pupils: Pupils are equal, round, and reactive to light.  Neck:     Thyroid: No thyromegaly.  Cardiovascular:     Rate and Rhythm: Normal rate and regular rhythm.     Heart sounds: No murmur heard.   Pulmonary:     Effort: Pulmonary effort is normal.     Breath sounds: Normal breath sounds. No rales.  Abdominal:     General: Bowel sounds are normal.     Palpations: Abdomen is soft.     Tenderness: There is no abdominal tenderness.  Musculoskeletal:        General: Normal range of motion.     Cervical back: Neck supple.  Lymphadenopathy:     Cervical: No cervical adenopathy.  Skin:    General: Skin is warm and dry.     Findings: No rash.  Neurological:     Mental Status: She is alert and oriented to person, place, and time.     Cranial Nerves: No cranial nerve deficit.     Deep Tendon Reflexes: Reflexes are normal and symmetric.  Psychiatric:        Mood and Affect: Mood normal.           Assessment & Plan:      This visit occurred during the SARS-CoV-2 public health emergency.  Safety protocols were in place, including screening questions prior to the visit, additional usage of staff PPE, and extensive cleaning of exam room while observing appropriate contact time as indicated for disinfecting solutions.

## 2020-06-30 NOTE — Assessment & Plan Note (Signed)
Overall doing well on Lexapro 10 mg, continue same.

## 2020-06-30 NOTE — Assessment & Plan Note (Signed)
Well controlled since cholecystectomy, continue to monitor.

## 2020-06-30 NOTE — Assessment & Plan Note (Addendum)
Continued but increased stress with the unexpected passing of her father. She would like to continue amitriptyline at 15 mg and update in a month.  Refill provided for amitriptyline 50 mg, will await update.

## 2020-07-01 ENCOUNTER — Encounter: Payer: Self-pay | Admitting: Primary Care

## 2020-07-09 ENCOUNTER — Other Ambulatory Visit: Payer: Self-pay | Admitting: Primary Care

## 2020-07-09 DIAGNOSIS — G43901 Migraine, unspecified, not intractable, with status migrainosus: Secondary | ICD-10-CM

## 2020-07-09 NOTE — Telephone Encounter (Signed)
Is she already in need of a refill of the Imitrex fo migraines? Or was this an autofill?

## 2020-07-13 NOTE — Telephone Encounter (Signed)
Patient returned call has 2 tabs left. She normally has to use 2-3 times a month.

## 2020-07-13 NOTE — Telephone Encounter (Signed)
Left message to return call to our office.  

## 2020-07-14 NOTE — Telephone Encounter (Signed)
Refills sent to pharmacy. 

## 2020-07-15 ENCOUNTER — Encounter: Payer: Self-pay | Admitting: Primary Care

## 2020-08-24 ENCOUNTER — Other Ambulatory Visit: Payer: Self-pay | Admitting: Primary Care

## 2020-08-24 DIAGNOSIS — G43901 Migraine, unspecified, not intractable, with status migrainosus: Secondary | ICD-10-CM

## 2020-09-08 ENCOUNTER — Other Ambulatory Visit: Payer: Self-pay | Admitting: Primary Care

## 2020-09-08 DIAGNOSIS — G4700 Insomnia, unspecified: Secondary | ICD-10-CM

## 2020-09-08 DIAGNOSIS — R519 Headache, unspecified: Secondary | ICD-10-CM

## 2020-09-08 DIAGNOSIS — D2261 Melanocytic nevi of right upper limb, including shoulder: Secondary | ICD-10-CM | POA: Diagnosis not present

## 2020-09-21 DIAGNOSIS — M9902 Segmental and somatic dysfunction of thoracic region: Secondary | ICD-10-CM | POA: Diagnosis not present

## 2020-09-21 DIAGNOSIS — M9903 Segmental and somatic dysfunction of lumbar region: Secondary | ICD-10-CM | POA: Diagnosis not present

## 2020-09-21 DIAGNOSIS — M9901 Segmental and somatic dysfunction of cervical region: Secondary | ICD-10-CM | POA: Diagnosis not present

## 2020-09-21 DIAGNOSIS — M9906 Segmental and somatic dysfunction of lower extremity: Secondary | ICD-10-CM | POA: Diagnosis not present

## 2020-12-23 ENCOUNTER — Other Ambulatory Visit: Payer: Self-pay | Admitting: Primary Care

## 2020-12-23 DIAGNOSIS — G43901 Migraine, unspecified, not intractable, with status migrainosus: Secondary | ICD-10-CM

## 2020-12-23 DIAGNOSIS — R519 Headache, unspecified: Secondary | ICD-10-CM

## 2020-12-23 DIAGNOSIS — G4700 Insomnia, unspecified: Secondary | ICD-10-CM

## 2021-03-03 DIAGNOSIS — Z01419 Encounter for gynecological examination (general) (routine) without abnormal findings: Secondary | ICD-10-CM | POA: Diagnosis not present

## 2021-03-03 DIAGNOSIS — Z3040 Encounter for surveillance of contraceptives, unspecified: Secondary | ICD-10-CM | POA: Diagnosis not present

## 2021-03-03 DIAGNOSIS — Z1231 Encounter for screening mammogram for malignant neoplasm of breast: Secondary | ICD-10-CM | POA: Diagnosis not present

## 2021-03-03 DIAGNOSIS — Z124 Encounter for screening for malignant neoplasm of cervix: Secondary | ICD-10-CM | POA: Diagnosis not present

## 2021-03-03 DIAGNOSIS — N898 Other specified noninflammatory disorders of vagina: Secondary | ICD-10-CM | POA: Diagnosis not present

## 2021-03-03 DIAGNOSIS — Z1239 Encounter for other screening for malignant neoplasm of breast: Secondary | ICD-10-CM | POA: Diagnosis not present

## 2021-03-03 LAB — HM PAP SMEAR

## 2021-03-03 LAB — RESULTS CONSOLE HPV: CHL HPV: NEGATIVE

## 2021-03-12 ENCOUNTER — Other Ambulatory Visit: Payer: Self-pay | Admitting: Primary Care

## 2021-03-12 DIAGNOSIS — G43901 Migraine, unspecified, not intractable, with status migrainosus: Secondary | ICD-10-CM

## 2021-04-08 ENCOUNTER — Other Ambulatory Visit: Payer: Self-pay

## 2021-04-08 ENCOUNTER — Ambulatory Visit: Payer: BC Managed Care – PPO | Admitting: Podiatry

## 2021-04-08 ENCOUNTER — Ambulatory Visit: Payer: BC Managed Care – PPO

## 2021-04-08 ENCOUNTER — Ambulatory Visit: Payer: Self-pay | Admitting: Podiatry

## 2021-04-08 DIAGNOSIS — M2011 Hallux valgus (acquired), right foot: Secondary | ICD-10-CM

## 2021-04-19 NOTE — Progress Notes (Signed)
° °  Subjective: 41 y.o. female presents today as a new patient for evaluation of a symptomatic bunion to the right foot.  Patient states that she denies a history of injury.  She developed pain on the big toe joint bunion area over the past year.  She does wear flats a lot.  Sudden onset of pain however.  She presents for further treatment evaluation   Past Medical History:  Diagnosis Date   Abnormal Pap smear 2002   Depression 2008   H/O chlamydia infection    H/O rubella    H/O urinary frequency 09/22/2002   History of varicella    Hx: UTI (urinary tract infection) 04/2009   Monilial vaginitis 06/12/2003   NSVD (normal spontaneous vaginal delivery) 12/31/2011   2227   Polyhydramnios in second trimester    Yeast infection    Past Surgical History:  Procedure Laterality Date   CHOLECYSTECTOMY  07/17/2019   WISDOM TOOTH EXTRACTION     No Known Allergies Mom   Objective: Physical Exam General: The patient is alert and oriented x3 in no acute distress.  Dermatology: Skin is cool, dry and supple bilateral lower extremities. Negative for open lesions or macerations.  Vascular: Palpable pedal pulses bilaterally. No edema or erythema noted. Capillary refill within normal limits.  Neurological: Epicritic and protective threshold grossly intact bilaterally.   Musculoskeletal Exam: Clinical evidence of a mild bunion deformity noted to the respective foot. There is moderate pain on palpation range of motion of the first MPJ. Lateral deviation of the hallux noted consistent with hallux abductovalgus.  Radiographic Exam: Mildly increased intermetatarsal angle.  Negative for any significant degenerative changes.  Joint spaces preserved.  Assessment: 1. HAV w/ bunion deformity right   Plan of Care:  1. Patient was evaluated. X-Rays reviewed. Patient's bunion deformities very mild.  Recommend conservative treatment including wide shoes and avoiding flats. 2.  OTC power step insoles were  provided to support the arch and alleviate pressure from the bunion area 3.  Return to clinic as needed   Felecia Shelling, DPM [facility]  Dr. Felecia Shelling, DPM    [address]                                        [place], [place] [address]                Office [phone]  Fax [phone]

## 2021-05-23 ENCOUNTER — Other Ambulatory Visit: Payer: Self-pay | Admitting: Primary Care

## 2021-05-23 DIAGNOSIS — F4323 Adjustment disorder with mixed anxiety and depressed mood: Secondary | ICD-10-CM

## 2021-05-31 ENCOUNTER — Other Ambulatory Visit: Payer: Self-pay

## 2021-05-31 ENCOUNTER — Ambulatory Visit
Admission: RE | Admit: 2021-05-31 | Discharge: 2021-05-31 | Disposition: A | Discharge status: Home / Self Care | Payer: BC Managed Care – PPO | Source: Ambulatory Visit | Attending: Urgent Care | Admitting: Urgent Care

## 2021-05-31 VITALS — BP 127/82 | HR 92 | Temp 98.9°F | Resp 16

## 2021-05-31 DIAGNOSIS — R52 Pain, unspecified: Secondary | ICD-10-CM | POA: Diagnosis not present

## 2021-05-31 DIAGNOSIS — H9203 Otalgia, bilateral: Secondary | ICD-10-CM

## 2021-05-31 DIAGNOSIS — R509 Fever, unspecified: Secondary | ICD-10-CM | POA: Diagnosis not present

## 2021-05-31 DIAGNOSIS — J020 Streptococcal pharyngitis: Secondary | ICD-10-CM | POA: Diagnosis not present

## 2021-05-31 DIAGNOSIS — R070 Pain in throat: Secondary | ICD-10-CM

## 2021-05-31 LAB — POCT RAPID STREP A (OFFICE): Rapid Strep A Screen: POSITIVE — AB

## 2021-05-31 MED ORDER — CLINDAMYCIN HCL 300 MG PO CAPS: 300.0000 mg | ORAL_CAPSULE | Freq: Three times a day (TID) | ORAL | 0 refills | Status: DC

## 2021-05-31 NOTE — ED Provider Notes (Signed)
Danielle Maxwell   MRN: [id number] DOB: 1980/10/28  Subjective:   Danielle Maxwell is a 41 y.o. female presenting for 1 week history of persistent fevers, malaise, throat pain, mild runny and stuffy nose, bilateral ear fullness and pain, bilateral neck pain.  She is also had some bilateral thoracic, lateral chest wall pains.  No cough, shortness of breath or wheezing.  No history of asthma, allergies.  She did a COVID test last week and was negative.  No current facility-administered medications for this encounter.  Current Outpatient Medications:    escitalopram (LEXAPRO) 10 MG tablet, TAKE 1 TABLET(10 MG) BY MOUTH DAILY FOR ANXIETY, Disp: 60 tablet, Rfl: 0   amitriptyline (ELAVIL) 10 MG tablet, TAKE 1 AND 1/2 TABLETS(15 MG) BY MOUTH AT BEDTIME FOR HEADACHE PREVENTION, Disp: 135 tablet, Rfl: 1   ondansetron (ZOFRAN ODT) 4 MG disintegrating tablet, Take 1 tablet (4 mg total) by mouth every 8 (eight) hours as needed for nausea or vomiting., Disp: 20 tablet, Rfl: 0   SUMAtriptan (IMITREX) 100 MG tablet, TAKE 1 TABLET BY MOUTH AT ONSET OF MIGRAINE. MAY REPEAT IN 2 HOURS IF MIGRAINES PERSIST, Disp: 10 tablet, Rfl: 0   XULANE 150-35 MCG/24HR transdermal patch, Place 1 patch onto the skin every Sunday. , Disp: , Rfl:    No Known Allergies  Past Medical History:  Diagnosis Date   Abnormal Pap smear 2002   Depression 2008   H/O chlamydia infection    H/O rubella    H/O urinary frequency 09/22/2002   History of varicella    Hx: UTI (urinary tract infection) 04/2009   Monilial vaginitis 06/12/2003   NSVD (normal spontaneous vaginal delivery) 12/31/2011   2227   Polyhydramnios in second trimester    Yeast infection      Past Surgical History:  Procedure Laterality Date   CHOLECYSTECTOMY  07/17/2019   WISDOM TOOTH EXTRACTION      Family History  Problem Relation Age of Onset   Diabetes Father    Hypertension Maternal Aunt    Hypertension Maternal Grandmother    Hypertension  Maternal Grandfather    Diabetes Maternal Grandfather    Prostate cancer Maternal Grandfather    Diabetes Paternal Grandmother    Anesthesia problems Neg Hx     Social History   Tobacco Use   Smoking status: Never   Smokeless tobacco: Never  Vaping Use   Vaping Use: Never used  Substance Use Topics   Alcohol use: Yes   Drug use: No    ROS   Objective:   Vitals: BP 127/82 (BP Location: Left Arm)    Pulse 92    Temp 98.9 F (37.2 C) (Oral)    Resp 16    LMP  (LMP Unknown)    SpO2 100%   Physical Exam Constitutional:      General: She is not in acute distress.    Appearance: Normal appearance. She is well-developed and normal weight. She is not ill-appearing, toxic-appearing or diaphoretic.  HENT:     Head: Normocephalic and atraumatic.     Right Ear: Ear canal and external ear normal. No drainage or tenderness. No middle ear effusion. There is no impacted cerumen. Tympanic membrane is not erythematous.     Left Ear: Ear canal and external ear normal. No drainage or tenderness.  No middle ear effusion. There is no impacted cerumen. Tympanic membrane is not erythematous.     Ears:     Comments: TMs opacified bilaterally.    Nose:  Nose normal. No congestion or rhinorrhea.     Mouth/Throat:     Mouth: Mucous membranes are moist. No oral lesions.     Pharynx: No pharyngeal swelling, oropharyngeal exudate, posterior oropharyngeal erythema or uvula swelling.     Tonsils: No tonsillar exudate or tonsillar abscesses. 0 on the right. 0 on the left.  Eyes:     General: No scleral icterus.       Right eye: No discharge.        Left eye: No discharge.     Extraocular Movements: Extraocular movements intact.     Right eye: Normal extraocular motion.     Left eye: Normal extraocular motion.     Conjunctiva/sclera: Conjunctivae normal.  Cardiovascular:     Rate and Rhythm: Normal rate.     Heart sounds: No murmur heard.   No friction rub. No gallop.  Pulmonary:     Effort:  Pulmonary effort is normal. No respiratory distress.     Breath sounds: No stridor. No wheezing, rhonchi or rales.  Chest:     Chest wall: No tenderness.  Abdominal:     General: Bowel sounds are normal. There is no distension.     Palpations: Abdomen is soft. There is no mass.     Tenderness: There is no abdominal tenderness. There is no right CVA tenderness, left CVA tenderness, guarding or rebound.  Musculoskeletal:     Cervical back: Normal range of motion and neck supple. Tenderness (along either side) present. No rigidity.  Lymphadenopathy:     Cervical: No cervical adenopathy.  Skin:    General: Skin is warm and dry.  Neurological:     General: No focal deficit present.     Mental Status: She is alert and oriented to person, place, and time.  Psychiatric:        Mood and Affect: Mood normal.        Behavior: Behavior normal.        Thought Content: Thought content normal.        Judgment: Judgment normal.    Results for orders placed or performed during the hospital encounter of 05/31/21 (from the past 24 hour(s))  POCT rapid strep A     Status: Abnormal   Collection Time: 05/31/21  6:32 PM  Result Value Ref Range   Rapid Strep A Screen Positive (A) Negative    Assessment and Plan :   PDMP not reviewed this encounter.  1. Strep pharyngitis   2. Throat pain   3. Fever, unspecified   4. Acute ear pain, bilateral   5. Pain of left side of body   6. Pain of right side of body    As there is a shortage of amoxicillin and cefdinir I will be using clindamycin to treat her strep pharyngitis.  I suspect all her other symptoms are likely related as they are systemic signs of illness.  Recommend supportive care otherwise. Deferred imaging given clear cardiopulmonary exam, hemodynamically stable vital signs. Counseled patient on potential for adverse effects with medications prescribed/recommended today, ER and return-to-clinic precautions discussed, patient verbalized  understanding.    Wallis Bamberg, [place] 05/31/21 1849

## 2021-05-31 NOTE — ED Triage Notes (Signed)
Pt c/o ST, fever, bilateral ear pain and rib pain x 1 week.

## 2021-07-07 ENCOUNTER — Other Ambulatory Visit: Payer: Self-pay | Admitting: Primary Care

## 2021-07-07 DIAGNOSIS — G43901 Migraine, unspecified, not intractable, with status migrainosus: Secondary | ICD-10-CM

## 2021-07-17 ENCOUNTER — Other Ambulatory Visit: Payer: Self-pay | Admitting: Primary Care

## 2021-07-17 DIAGNOSIS — F4323 Adjustment disorder with mixed anxiety and depressed mood: Secondary | ICD-10-CM

## 2021-07-27 ENCOUNTER — Encounter: Payer: Self-pay | Admitting: Primary Care

## 2021-07-27 ENCOUNTER — Ambulatory Visit (INDEPENDENT_AMBULATORY_CARE_PROVIDER_SITE_OTHER): Payer: BC Managed Care – PPO | Admitting: Primary Care

## 2021-07-27 VITALS — BP 118/82 | HR 73 | Temp 97.2°F | Ht 63.75 in | Wt 174.0 lb

## 2021-07-27 DIAGNOSIS — R519 Headache, unspecified: Secondary | ICD-10-CM | POA: Diagnosis not present

## 2021-07-27 DIAGNOSIS — F4323 Adjustment disorder with mixed anxiety and depressed mood: Secondary | ICD-10-CM

## 2021-07-27 DIAGNOSIS — K219 Gastro-esophageal reflux disease without esophagitis: Secondary | ICD-10-CM | POA: Diagnosis not present

## 2021-07-27 DIAGNOSIS — R5383 Other fatigue: Secondary | ICD-10-CM

## 2021-07-27 DIAGNOSIS — Z0001 Encounter for general adult medical examination with abnormal findings: Secondary | ICD-10-CM

## 2021-07-27 DIAGNOSIS — G43909 Migraine, unspecified, not intractable, without status migrainosus: Secondary | ICD-10-CM | POA: Diagnosis not present

## 2021-07-27 DIAGNOSIS — Z23 Encounter for immunization: Secondary | ICD-10-CM

## 2021-07-27 LAB — LIPID PANEL
Cholesterol: 183 mg/dL (ref 0–200)
HDL: 59.1 mg/dL (ref >39.00)
LDL Cholesterol: 103 mg/dL — ABNORMAL HIGH (ref 0–99)
NonHDL: 123.73
Total CHOL/HDL Ratio: 3
Triglycerides: 105 mg/dL (ref 0.0–149.0)
VLDL: 21 mg/dL (ref 0.0–40.0)

## 2021-07-27 LAB — CBC
HCT: 35.8 % — ABNORMAL LOW (ref 36.0–46.0)
Hemoglobin: 11.4 g/dL — ABNORMAL LOW (ref 12.0–15.0)
MCHC: 31.7 g/dL (ref 30.0–36.0)
MCV: 79.7 fl (ref 78.0–100.0)
Platelets: 325 10*3/uL (ref 150.0–400.0)
RBC: 4.49 Mil/uL (ref 3.87–5.11)
RDW: 19.6 % — ABNORMAL HIGH (ref 11.5–15.5)
WBC: 6.1 10*3/uL (ref 4.0–10.5)

## 2021-07-27 LAB — COMPREHENSIVE METABOLIC PANEL
ALT: 6 U/L (ref 0–35)
AST: 10 U/L (ref 0–37)
Albumin: 3.9 g/dL (ref 3.5–5.2)
Alkaline Phosphatase: 39 U/L (ref 39–117)
BUN: 7 mg/dL (ref 6–23)
CO2: 26 mEq/L (ref 19–32)
Calcium: 8.9 mg/dL (ref 8.4–10.5)
Chloride: 103 mEq/L (ref 96–112)
Creatinine, Ser: 0.8 mg/dL (ref 0.40–1.20)
GFR: 91.85 mL/min (ref >60.00)
Glucose, Bld: 76 mg/dL (ref 70–99)
Potassium: 4.1 mEq/L (ref 3.5–5.1)
Sodium: 137 mEq/L (ref 135–145)
Total Bilirubin: 0.3 mg/dL (ref 0.2–1.2)
Total Protein: 7.1 g/dL (ref 6.0–8.3)

## 2021-07-27 LAB — VITAMIN D 25 HYDROXY (VIT D DEFICIENCY, FRACTURES): VITD: 14.27 ng/mL — ABNORMAL LOW (ref 30.00–100.00)

## 2021-07-27 LAB — VITAMIN B12: Vitamin B-12: 104 pg/mL — ABNORMAL LOW (ref 211–911)

## 2021-07-27 LAB — TSH: TSH: 2.42 u[IU]/mL (ref 0.35–5.50)

## 2021-07-27 MED ORDER — TOPIRAMATE 50 MG PO TABS: 50.0000 mg | ORAL_TABLET | Freq: Every day | ORAL | 0 refills | Status: DC

## 2021-07-27 MED ORDER — RIZATRIPTAN BENZOATE 10 MG PO TABS: ORAL_TABLET | ORAL | 0 refills | Status: DC

## 2021-07-27 NOTE — Assessment & Plan Note (Signed)
Controlled. ? ?No concerns today. ?Remain off of treatment. ?

## 2021-07-27 NOTE — Assessment & Plan Note (Signed)
Uncontrolled. ? ?Stop amitriptyline 50 mg daily. ?Start Topamax 80 mg at bedtime. ? ?Stop Imitrex 100 mg as needed. ?Start Maxalt 5 to 10 mg PRN. ? ?Referral placed to neurology just in case treatment is not effective. ? ?She will update in a few weeks ?

## 2021-07-27 NOTE — Assessment & Plan Note (Signed)
Uncontrolled. ? ?Discontinue amitriptyline 15 mg, she has not had this in 1 month. ? ?Start Topamax 50 mg HS. ?Stop Imitrex as this is ineffective, will try Maxalt 5-10 mg PRN. ? ?She will update.  ? ?Referral placed to neurology in case Topamax is ineffective. ?

## 2021-07-27 NOTE — Progress Notes (Signed)
? ?Subjective:  ? ? Patient ID: Danielle HawthorneStacy C Maxwell, female    DOB: 06/13/1980, 41 y.o.   MRN: [id number] ? ?HPI ? ?Danielle Maxwell is a very pleasant 41 y.o. female who presents today for complete physical and follow up of chronic conditions. ? ?Chronic migraines for years, currently managed on amitriptyline 15 mg HS. Migraines located to the mid frontal lobe with spread across her frontal lobes. She is now experiencing an aura with left blurred vision with aching ears, always followed by a headache or migraines. She works on a [employer] all day for work. She wears blue light glasses and limits the light from her computer.  ? ?She finds no improvement from amitriptyline which also causes restless legs. Last dose was one month ago. Imitrex is sometimes effective, but always has to take the second dose. She's never tried Topamax. She is very tired, suspects this is secondary to her migraines.  ? ?Immunizations: ?-Tetanus: 2013 ?-Influenza: Did not complete last season  ?-Covid-19: 2 vaccines ? ?Diet: Fair diet.  ?Exercise: No regular exercise. ? ?Eye exam: Completes annually  ?Dental exam: Completes semi-annually  ? ?Pap Smear: Completed UTD, follows with GYN. ?Mammogram: Completed in September 2022 per GYN. ? ?BP Readings from Last 3 Encounters:  ?07/27/21 118/82  ?05/31/21 127/82  ?06/30/20 118/64  ? ? ? ? ? ?Review of Systems  ?Constitutional:  Negative for unexpected weight change.  ?HENT:  Negative for rhinorrhea.   ?Eyes:  Negative for visual disturbance.  ?Respiratory:  Negative for cough and shortness of breath.   ?Cardiovascular:  Negative for chest pain.  ?Gastrointestinal:  Negative for constipation and diarrhea.  ?Genitourinary:  Negative for difficulty urinating and menstrual problem.  ?Musculoskeletal:  Negative for arthralgias and myalgias.  ?Skin:  Negative for rash.  ?Allergic/Immunologic: Negative for environmental allergies.  ?Neurological:  Positive for headaches. Negative for dizziness.   ?Psychiatric/Behavioral:  The patient is not nervous/anxious.   ? ?   ? ? ?Past Medical History:  ?Diagnosis Date  ? Abnormal Pap smear 2002  ? Depression 2008  ? H/O chlamydia infection   ? H/O rubella   ? H/O urinary frequency 09/22/2002  ? History of varicella   ? Hx: UTI (urinary tract infection) 04/2009  ? Monilial vaginitis 06/12/2003  ? NSVD (normal spontaneous vaginal delivery) 12/31/2011  ? 2227  ? Polyhydramnios in second trimester   ? Yeast infection   ? ? ?Social History  ? ?Socioeconomic History  ? Marital status: Married  ?  Spouse name: Not on file  ? Number of children: Not on file  ? Years of education: Not on file  ? Highest education level: Not on file  ?Occupational History  ? Not on file  ?Tobacco Use  ? Smoking status: Never  ? Smokeless tobacco: Never  ?Vaping Use  ? Vaping Use: Never used  ?Substance and Sexual Activity  ? Alcohol use: Yes  ? Drug use: No  ? Sexual activity: Yes  ?  Partners: Male  ?  Birth control/protection: None  ?Other Topics Concern  ? Not on file  ?Social History Narrative  ? Married.  ? 2 children.  ? Works at [employer].  ? Enjoys reading.   ? ?Social Determinants of Health  ? ?Financial Resource Strain: Not on file  ?Food Insecurity: Not on file  ?Transportation Needs: Not on file  ?Physical Activity: Not on file  ?Stress: Not on file  ?Social Connections: Not on file  ?Intimate Partner Violence: Not on file  ? ? ?  Past Surgical History:  ?Procedure Laterality Date  ? CHOLECYSTECTOMY  07/17/2019  ? WISDOM TOOTH EXTRACTION    ? ? ?Family History  ?Problem Relation Age of Onset  ? Diabetes Father   ? Hypertension Maternal Aunt   ? Hypertension Maternal Grandmother   ? Hypertension Maternal Grandfather   ? Diabetes Maternal Grandfather   ? Prostate cancer Maternal Grandfather   ? Diabetes Paternal Grandmother   ? Anesthesia problems Neg Hx   ? ? ?No Known Allergies ? ?Current Outpatient Medications on File Prior to Visit  ?Medication Sig Dispense Refill  ? amitriptyline (ELAVIL)  10 MG tablet TAKE 1 AND 1/2 TABLETS(15 MG) BY MOUTH AT BEDTIME FOR HEADACHE PREVENTION 135 tablet 1  ? clindamycin (CLEOCIN) 300 MG capsule Take 1 capsule (300 mg total) by mouth 3 (three) times daily. 30 capsule 0  ? escitalopram (LEXAPRO) 10 MG tablet Take 1 tablet (10 mg total) by mouth daily. for anxiety and depression. Office visit required for further refills. 30 tablet 0  ? ondansetron (ZOFRAN ODT) 4 MG disintegrating tablet Take 1 tablet (4 mg total) by mouth every 8 (eight) hours as needed for nausea or vomiting. 20 tablet 0  ? SUMAtriptan (IMITREX) 100 MG tablet TAKE 1 TABLET BY MOUTH AT ONSET OF MIGRAINE. MAY REPEAT IN 2 HOURS IF MIGRAINES PERSIST 10 tablet 0  ? XULANE 150-35 MCG/24HR transdermal patch Place 1 patch onto the skin every Sunday.     ? ?No current facility-administered medications on file prior to visit.  ? ? ?BP 118/82 (BP Location: Left Arm, Patient Position: Sitting, Cuff Size: Normal)   Pulse 73   Temp (!) 97.2 ?F (36.2 ?C) (Temporal)   Ht 5' 3.75" (1.619 m)   Wt 174 lb (78.9 kg)   SpO2 100%   BMI 30.10 kg/m?  ?Objective:  ? Physical Exam ?HENT:  ?   Right Ear: Tympanic membrane and ear canal normal.  ?   Left Ear: Tympanic membrane and ear canal normal.  ?   Nose: Nose normal.  ?Eyes:  ?   Conjunctiva/sclera: Conjunctivae normal.  ?   Pupils: Pupils are equal, round, and reactive to light.  ?Neck:  ?   Thyroid: No thyromegaly.  ?Cardiovascular:  ?   Rate and Rhythm: Normal rate and regular rhythm.  ?   Heart sounds: No murmur heard. ?Pulmonary:  ?   Effort: Pulmonary effort is normal.  ?   Breath sounds: Normal breath sounds. No rales.  ?Abdominal:  ?   General: Bowel sounds are normal.  ?   Palpations: Abdomen is soft.  ?   Tenderness: There is no abdominal tenderness.  ?Musculoskeletal:     ?   General: Normal range of motion.  ?   Cervical back: Neck supple.  ?Lymphadenopathy:  ?   Cervical: No cervical adenopathy.  ?Skin: ?   General: Skin is warm and dry.  ?   Findings: No  rash.  ?Neurological:  ?   Mental Status: She is alert and oriented to person, place, and time.  ?   Cranial Nerves: No cranial nerve deficit.  ?   Deep Tendon Reflexes: Reflexes are normal and symmetric.  ?Psychiatric:     ?   Mood and Affect: Mood normal.  ? ? ? ? ? ?   ?Assessment & Plan:  ? ? ? ? ?This visit occurred during the SARS-CoV-2 public health emergency.  Safety protocols were in place, including screening questions prior to the visit, additional usage of  staff PPE, and extensive cleaning of exam room while observing appropriate contact time as indicated for disinfecting solutions.  ?

## 2021-07-27 NOTE — Assessment & Plan Note (Signed)
Tetanus due, updated today. ?Pap smear up-to-date, follows with GYN. ?Mammogram up-to-date, follows with GYN. ? ?Discussed the importance of a healthy diet and regular exercise in order for weight loss, and to reduce the risk of further co-morbidity. ? ?Exam today stable ?Labs pending ?

## 2021-07-27 NOTE — Patient Instructions (Signed)
Stop by the lab prior to leaving today. I will notify you of your results once received.  ? ?Stop taking amitriptyline for headaches. ? ?Start taking topiramate (Topamax) 50 mg for headaches.  Take 1 tablet by mouth at bedtime. ? ?Stop taking sumatriptan (Imitrex) for migraine abortion. ? ?Start taking rizatriptan (Maxalt) for migraine abortion as needed.  Take 1/2 to 1 tablet by mouth at migraine onset, may repeat in 2 hours if needed. ? ?You will be contacted regarding your referral to neurology.  Please let us know if you have not been contacted within two weeks.  ? ?Please update me in a few weeks! ? ?It was a pleasure to see you today! ? ?Preventive Care 75-58 Years Old, Female ?Preventive care refers to lifestyle choices and visits with your health care provider that can promote health and wellness. Preventive care visits are also called wellness exams. ?What can I expect for my preventive care visit? ?Counseling ?Your health care provider may ask you questions about your: ?Medical history, including: ?Past medical problems. ?Family medical history. ?Pregnancy history. ?Current health, including: ?Menstrual cycle. ?Method of birth control. ?Emotional well-being. ?Home life and relationship well-being. ?Sexual activity and sexual health. ?Lifestyle, including: ?Alcohol, nicotine or tobacco, and drug use. ?Access to firearms. ?Diet, exercise, and sleep habits. ?Work and work Statistician. ?Sunscreen use. ?Safety issues such as seatbelt and bike helmet use. ?Physical exam ?Your health care provider will check your: ?Height and weight. These may be used to calculate your BMI (body mass index). BMI is a measurement that tells if you are at a healthy weight. ?Waist circumference. This measures the distance around your waistline. This measurement also tells if you are at a healthy weight and may help predict your risk of certain diseases, such as type 2 diabetes and high blood pressure. ?Heart rate and blood  pressure. ?Body temperature. ?Skin for abnormal spots. ?What immunizations do I need? ?Vaccines are usually given at various ages, according to a schedule. Your health care provider will recommend vaccines for you based on your age, medical history, and lifestyle or other factors, such as travel or where you work. ?What tests do I need? ?Screening ?Your health care provider may recommend screening tests for certain conditions. This may include: ?Lipid and cholesterol levels. ?Diabetes screening. This is done by checking your blood sugar (glucose) after you have not eaten for a while (fasting). ?Pelvic exam and Pap test. ?Hepatitis B test. ?Hepatitis C test. ?HIV (human immunodeficiency virus) test. ?STI (sexually transmitted infection) testing, if you are at risk. ?Lung cancer screening. ?Colorectal cancer screening. ?Mammogram. Talk with your health care provider about when you should start having regular mammograms. This may depend on whether you have a family history of breast cancer. ?BRCA-related cancer screening. This may be done if you have a family history of breast, ovarian, tubal, or peritoneal cancers. ?Bone density scan. This is done to screen for osteoporosis. ?Talk with your health care provider about your test results, treatment options, and if necessary, the need for more tests. ?Follow these instructions at home: ?Eating and drinking ? ?Eat a diet that includes fresh fruits and vegetables, whole grains, lean protein, and low-fat dairy products. ?Take vitamin and mineral supplements as recommended by your health care provider. ?Do not drink alcohol if: ?Your health care provider tells you not to drink. ?You are pregnant, may be pregnant, or are planning to become pregnant. ?If you drink alcohol: ?Limit how much you have to 0-1 drink a day. ?Know how  much alcohol is in your drink. In the [place] one drink equals one 12 oz bottle of beer (355 mL), one 5 oz glass of wine (148 mL), or one 1? oz glass of  hard liquor (44 mL). ?Lifestyle ?Brush your teeth every morning and night with fluoride toothpaste. Floss one time each day. ?Exercise for at least 30 minutes 5 or more days each week. ?Do not use any products that contain nicotine or tobacco. These products include cigarettes, chewing tobacco, and vaping devices, such as e-cigarettes. If you need help quitting, ask your health care provider. ?Do not use drugs. ?If you are sexually active, practice safe sex. Use a condom or other form of protection to prevent STIs. ?If you do not wish to become pregnant, use a form of birth control. If you plan to become pregnant, see your health care provider for a prepregnancy visit. ?Take aspirin only as told by your health care provider. Make sure that you understand how much to take and what form to take. Work with your health care provider to find out whether it is safe and beneficial for you to take aspirin daily. ?Find healthy ways to manage stress, such as: ?Meditation, yoga, or listening to music. ?Journaling. ?Talking to a trusted person. ?Spending time with friends and family. ?Minimize exposure to UV radiation to reduce your risk of skin cancer. ?Safety ?Always wear your seat belt while driving or riding in a vehicle. ?Do not drive: ?If you have been drinking alcohol. Do not ride with someone who has been drinking. ?When you are tired or distracted. ?While texting. ?If you have been using any mind-altering substances or drugs. ?Wear a helmet and other protective equipment during sports activities. ?If you have firearms in your house, make sure you follow all gun safety procedures. ?Seek help if you have been physically or sexually abused. ?What's next? ?Visit your health care provider once a year for an annual wellness visit. ?Ask your health care provider how often you should have your eyes and teeth checked. ?Stay up to date on all vaccines. ?This information is not intended to replace advice given to you by your  health care provider. Make sure you discuss any questions you have with your health care provider. ?Document Revised: 09/29/2020 Document Reviewed: 09/29/2020 ?Elsevier Patient Education ? Spring. ? ?

## 2021-07-27 NOTE — Assessment & Plan Note (Signed)
Improved and controlled. ? ?Continue Lexapro 10 mg daily. ?

## 2021-07-27 NOTE — Assessment & Plan Note (Signed)
Suspect this could be multifactorial including migraines, some depression. ? ?Checking labs today to rule out metabolic cause. ? ?Working to treat migraines. ?

## 2021-07-28 DIAGNOSIS — E559 Vitamin D deficiency, unspecified: Secondary | ICD-10-CM

## 2021-07-28 MED ORDER — VITAMIN D3 1.25 MG (50000 UT) PO CAPS: 1.0000 | ORAL_CAPSULE | ORAL | 0 refills | Status: DC

## 2021-07-28 NOTE — Telephone Encounter (Signed)
Danielle Maxwell, see my proposed plan for B12 injections for this patient.  Can you help her to get set up? ?

## 2021-08-01 NOTE — Telephone Encounter (Signed)
Called patient scheduled for first 4 b12 injections and lab appointment for 3 months.  ?

## 2021-08-03 ENCOUNTER — Ambulatory Visit (INDEPENDENT_AMBULATORY_CARE_PROVIDER_SITE_OTHER): Payer: BC Managed Care – PPO

## 2021-08-03 DIAGNOSIS — E538 Deficiency of other specified B group vitamins: Secondary | ICD-10-CM | POA: Diagnosis not present

## 2021-08-03 MED ORDER — CYANOCOBALAMIN 1000 MCG/ML IJ SOLN
1000.0000 ug | Freq: Once | INTRAMUSCULAR | Status: AC
  Administered 2021-08-03: 1000 ug via INTRAMUSCULAR

## 2021-08-03 NOTE — Progress Notes (Signed)
Pt came in to receive Vit B12 Injection, pt was given injection IM 1,03mcg/ml in right deltoid successfully w/o any concern. Pt will set an appt for follow up injection   ?

## 2021-08-09 ENCOUNTER — Encounter: Payer: Self-pay | Admitting: *Deleted

## 2021-08-11 ENCOUNTER — Ambulatory Visit (INDEPENDENT_AMBULATORY_CARE_PROVIDER_SITE_OTHER): Payer: BC Managed Care – PPO

## 2021-08-11 ENCOUNTER — Encounter: Payer: Self-pay | Admitting: Emergency Medicine

## 2021-08-11 ENCOUNTER — Ambulatory Visit
Admission: EM | Admit: 2021-08-11 | Discharge: 2021-08-11 | Disposition: A | Discharge status: Home / Self Care | Payer: BC Managed Care – PPO | Attending: Emergency Medicine | Admitting: Emergency Medicine

## 2021-08-11 ENCOUNTER — Other Ambulatory Visit: Payer: Self-pay | Admitting: Primary Care

## 2021-08-11 DIAGNOSIS — G43909 Migraine, unspecified, not intractable, without status migrainosus: Secondary | ICD-10-CM

## 2021-08-11 DIAGNOSIS — R519 Headache, unspecified: Secondary | ICD-10-CM

## 2021-08-11 DIAGNOSIS — R110 Nausea: Secondary | ICD-10-CM

## 2021-08-11 DIAGNOSIS — E538 Deficiency of other specified B group vitamins: Secondary | ICD-10-CM

## 2021-08-11 DIAGNOSIS — L568 Other specified acute skin changes due to ultraviolet radiation: Secondary | ICD-10-CM

## 2021-08-11 HISTORY — DX: Migraine, unspecified, not intractable, without status migrainosus: G43.909

## 2021-08-11 MED ORDER — KETOROLAC TROMETHAMINE 30 MG/ML IJ SOLN
30.0000 mg | Freq: Once | INTRAMUSCULAR | Status: AC
  Administered 2021-08-11: 30 mg via INTRAMUSCULAR

## 2021-08-11 MED ORDER — DEXAMETHASONE SODIUM PHOSPHATE 10 MG/ML IJ SOLN: 10.0000 mg | Freq: Once | INTRAMUSCULAR | Status: DC

## 2021-08-11 MED ORDER — DEXAMETHASONE SODIUM PHOSPHATE 10 MG/ML IJ SOLN
10.0000 mg | Freq: Once | INTRAMUSCULAR | Status: AC
  Administered 2021-08-11: 10 mg via INTRAMUSCULAR

## 2021-08-11 MED ORDER — CYANOCOBALAMIN 1000 MCG/ML IJ SOLN
1000.0000 ug | Freq: Once | INTRAMUSCULAR | Status: AC
  Administered 2021-08-11: 1000 ug via INTRAMUSCULAR

## 2021-08-11 NOTE — ED Provider Notes (Signed)
?[facility] ? ? ? ?CSN: [id number] ?Arrival date & time: 08/11/21  1310 ? ? ?  ? ?History   ?Chief Complaint ?Chief Complaint  ?Patient presents with  ? Migraine  ? ? ?HPI ?Danielle Maxwell is a 41 y.o. female.  Patient presents with 1 week history of migraine headache with nausea and photosensitivity.  She states this is similar to previous migraine headaches.  She denies numbness, weakness, dizziness, chest pain, shortness of breath, vomiting, or other symptoms.  Treatment attempted with rizatriptan without relief.  Patient also takes topiramate.  Her medical history includes migraine headaches, GERD, adjustment disorder with mixed anxiety and depressed mood, plantar fasciitis.  She denies current pregnancy or breastfeeding.   ? ? ?The history is provided by the patient and medical records.  ? ?Past Medical History:  ?Diagnosis Date  ? Abnormal Pap smear 04/17/2000  ? Depression 04/17/2006  ? H/O chlamydia infection   ? H/O rubella   ? H/O urinary frequency 09/22/2002  ? History of varicella   ? Hx: UTI (urinary tract infection) 04/17/2009  ? Migraine   ? Monilial vaginitis 06/12/2003  ? NSVD (normal spontaneous vaginal delivery) 12/31/2011  ? 2227  ? Polyhydramnios in second trimester   ? Yeast infection   ? ? ?Patient Active Problem List  ? Diagnosis Date Noted  ? Fatigue 07/27/2021  ? GERD (gastroesophageal reflux disease) 03/20/2019  ? Frequent headaches 01/14/2018  ? Insomnia 01/14/2018  ? Migraine 10/24/2017  ? Encounter for annual general medical examination with abnormal findings in adult 03/09/2015  ? Ganglion cyst of right foot 03/09/2015  ? Plantar fasciitis, bilateral 02/11/2014  ? Adjustment disorder with mixed anxiety and depressed mood 02/11/2014  ? ? ?Past Surgical History:  ?Procedure Laterality Date  ? CHOLECYSTECTOMY  07/17/2019  ? WISDOM TOOTH EXTRACTION    ? ? ?OB History   ? ? Gravida  ?3  ? Para  ?2  ? Term  ?2  ? Preterm  ?   ? AB  ?1  ? Living  ?2  ?  ? ? SAB  ?   ? IAB  ?0  ? Ectopic   ?   ? Multiple  ?   ? Live Births  ?2  ?   ?  ?  ? ? ? ?Home Medications   ? ?Prior to Admission medications   ?Medication Sig Start Date End Date Taking? Authorizing Provider  ?Cholecalciferol (VITAMIN D3) 1.25 MG (50000 UT) CAPS Take 1 capsule by mouth once a week. 07/28/21   Doreene Nest, NP  ?clindamycin (CLEOCIN) 300 MG capsule Take 1 capsule (300 mg total) by mouth 3 (three) times daily. 05/31/21   Wallis Bamberg, PA-C  ?escitalopram (LEXAPRO) 10 MG tablet Take 1 tablet (10 mg total) by mouth daily. for anxiety and depression. Office visit required for further refills. 07/17/21   Doreene Nest, NP  ?ondansetron (ZOFRAN ODT) 4 MG disintegrating tablet Take 1 tablet (4 mg total) by mouth every 8 (eight) hours as needed for nausea or vomiting. 06/30/20   Doreene Nest, NP  ?rizatriptan (MAXALT) 10 MG tablet Take 1/2 to 1 tablet by mouth at migraine onset. May repeat in 2 hours if needed. 07/27/21   Doreene Nest, NP  ?topiramate (TOPAMAX) 50 MG tablet Take 1 tablet (50 mg total) by mouth at bedtime. For headache prevention. 07/27/21   Doreene Nest, NP  ?XULANE 150-35 MCG/24HR transdermal patch Place 1 patch onto the skin every Sunday.  05/23/19   [provider]  ? ? ?Family History ?Family History  ?Problem Relation Age of Onset  ? Diabetes Father   ? Hypertension Maternal Aunt   ? Hypertension Maternal Grandmother   ? Hypertension Maternal Grandfather   ? Diabetes Maternal Grandfather   ? Prostate cancer Maternal Grandfather   ? Diabetes Paternal Grandmother   ? Anesthesia problems Neg Hx   ? ? ?Social History ?Social History  ? ?Tobacco Use  ? Smoking status: Never  ? Smokeless tobacco: Never  ?Vaping Use  ? Vaping Use: Never used  ?Substance Use Topics  ? Alcohol use: Yes  ? Drug use: No  ? ? ? ?Allergies   ?Patient has no known allergies. ? ? ?Review of Systems ?Review of Systems  ?Constitutional:  Negative for chills and fever.  ?Eyes:  Positive for photophobia. Negative for pain  and visual disturbance.  ?Respiratory:  Negative for cough and shortness of breath.   ?Cardiovascular:  Negative for chest pain and palpitations.  ?Gastrointestinal:  Positive for nausea. Negative for abdominal pain, diarrhea and vomiting.  ?Neurological:  Positive for headaches. Negative for dizziness, seizures, syncope, facial asymmetry, speech difficulty, weakness and numbness.  ?All other systems reviewed and are negative. ? ? ?Physical Exam ?Triage Vital Signs ?ED Triage Vitals  ?Enc Vitals Group  ?   BP   ?   Pulse   ?   Resp   ?   Temp   ?   Temp src   ?   SpO2   ?   Weight   ?   Height   ?   Head Circumference   ?   Peak Flow   ?   Pain Score   ?   Pain Loc   ?   Pain Edu?   ?   Excl. in GC?   ? ?No data found. ? ?Updated Vital Signs ?BP 117/88   Pulse 99   Temp 98.6 ?F (37 ?C)   Resp 20   SpO2 98%  ? ?Visual Acuity ?Right Eye Distance:   ?Left Eye Distance:   ?Bilateral Distance:   ? ?Right Eye Near:   ?Left Eye Near:    ?Bilateral Near:    ? ?Physical Exam ?Vitals and nursing note reviewed.  ?Constitutional:   ?   General: She is not in acute distress. ?   Appearance: Normal appearance. She is well-developed. She is not ill-appearing.  ?HENT:  ?   Right Ear: Tympanic membrane normal.  ?   Left Ear: Tympanic membrane normal.  ?   Mouth/Throat:  ?   Mouth: Mucous membranes are moist.  ?   Pharynx: Oropharynx is clear.  ?Eyes:  ?   Extraocular Movements: Extraocular movements intact.  ?   Pupils: Pupils are equal, round, and reactive to light.  ?Cardiovascular:  ?   Rate and Rhythm: Normal rate and regular rhythm.  ?   Heart sounds: Normal heart sounds.  ?Pulmonary:  ?   Effort: Pulmonary effort is normal. No respiratory distress.  ?   Breath sounds: Normal breath sounds.  ?Musculoskeletal:  ?   Cervical back: Neck supple.  ?Skin: ?   General: Skin is warm and dry.  ?Neurological:  ?   General: No focal deficit present.  ?   Mental Status: She is alert and oriented to person, place, and time.  ?   Cranial  Nerves: No cranial nerve deficit.  ?   Sensory: No sensory deficit.  ?  Motor: No weakness.  ?   Gait: Gait normal.  ?Psychiatric:     ?   Mood and Affect: Mood normal.     ?   Behavior: Behavior normal.  ? ? ? ?UC Treatments / Results  ?Labs ?(all labs ordered are listed, but only abnormal results are displayed) ?Labs Reviewed - No data to display ? ?EKG ? ? ?Radiology ?No results found. ? ?Procedures ?Procedures (including critical care time) ? ?Medications Ordered in UC ?Medications  ?dexamethasone (DECADRON) injection 10 mg (has no administration in time range)  ?ketorolac (TORADOL) 30 MG/ML injection 30 mg (has no administration in time range)  ? ? ?Initial Impression / Assessment and Plan / UC Course  ?I have reviewed the triage vital signs and the nursing notes. ? ?Pertinent labs & imaging results that were available during my care of the patient were reviewed by me and considered in my medical decision making (see chart for details). ? ?Migraine headache, nausea, photosensitivity.  Patient is well-appearing and her exam is reassuring.  Treated with dexamethasone and Toradol injections.  Instructed patient to follow-up with her PCP.  ED precautions discussed and education provided on migraine headaches.  Patient agrees to plan of care. ? ? ?Final Clinical Impressions(s) / UC Diagnoses  ? ?Final diagnoses:  ?Migraine without status migrainosus, not intractable, unspecified migraine type  ?Nausea without vomiting  ?Photosensitivity  ? ? ? ?Discharge Instructions   ? ?  ?You were given an injection of dexamethasone and ketorolac today for your migraine headache.  Go to the emergency department if your symptoms persist or worsen.  Follow-up with your primary care provider. ? ? ? ? ?ED Prescriptions   ?None ?  ? ?PDMP not reviewed this encounter. ?  ?Mickie Bailate, Graiden Henes H, NP ?08/11/21 1437 ? ?

## 2021-08-11 NOTE — Discharge Instructions (Addendum)
You were given an injection of dexamethasone and ketorolac today for your migraine headache.  Go to the emergency department if your symptoms persist or worsen.  Follow-up with your primary care provider. ?

## 2021-08-11 NOTE — Progress Notes (Signed)
Per orders of Dr. Duncan, injection of vit B12 given by Anabelen Kaminsky. Patient tolerated injection well.  

## 2021-08-11 NOTE — ED Triage Notes (Addendum)
Pt here with 1 week hx of migraine. Migraine is accompanied with photosensitivity and nausea. Pt takes a daily preventative migraine medication and a rescue medication. Pt took her rescue meds when it first started, but has run out of that med.  ?

## 2021-08-12 DIAGNOSIS — K219 Gastro-esophageal reflux disease without esophagitis: Secondary | ICD-10-CM | POA: Diagnosis not present

## 2021-08-12 DIAGNOSIS — G43009 Migraine without aura, not intractable, without status migrainosus: Secondary | ICD-10-CM | POA: Diagnosis not present

## 2021-08-12 DIAGNOSIS — F4322 Adjustment disorder with anxiety: Secondary | ICD-10-CM | POA: Diagnosis not present

## 2021-08-12 DIAGNOSIS — G43909 Migraine, unspecified, not intractable, without status migrainosus: Secondary | ICD-10-CM | POA: Diagnosis not present

## 2021-08-12 DIAGNOSIS — Z79899 Other long term (current) drug therapy: Secondary | ICD-10-CM | POA: Diagnosis not present

## 2021-08-13 ENCOUNTER — Encounter: Payer: Self-pay | Admitting: Primary Care

## 2021-08-13 ENCOUNTER — Other Ambulatory Visit: Payer: Self-pay | Admitting: Primary Care

## 2021-08-13 DIAGNOSIS — F4323 Adjustment disorder with mixed anxiety and depressed mood: Secondary | ICD-10-CM

## 2021-08-15 ENCOUNTER — Other Ambulatory Visit: Payer: Self-pay | Admitting: Primary Care

## 2021-08-15 DIAGNOSIS — R519 Headache, unspecified: Secondary | ICD-10-CM

## 2021-08-15 DIAGNOSIS — G43909 Migraine, unspecified, not intractable, without status migrainosus: Secondary | ICD-10-CM

## 2021-08-16 ENCOUNTER — Encounter: Payer: Self-pay | Admitting: Primary Care

## 2021-08-16 ENCOUNTER — Other Ambulatory Visit: Payer: Self-pay | Admitting: Primary Care

## 2021-08-16 DIAGNOSIS — R519 Headache, unspecified: Secondary | ICD-10-CM

## 2021-08-16 DIAGNOSIS — G43909 Migraine, unspecified, not intractable, without status migrainosus: Secondary | ICD-10-CM

## 2021-08-16 MED ORDER — RIZATRIPTAN BENZOATE 10 MG PO TABS: ORAL_TABLET | ORAL | 0 refills | Status: DC

## 2021-08-17 ENCOUNTER — Ambulatory Visit (INDEPENDENT_AMBULATORY_CARE_PROVIDER_SITE_OTHER): Payer: BC Managed Care – PPO

## 2021-08-17 DIAGNOSIS — E538 Deficiency of other specified B group vitamins: Secondary | ICD-10-CM | POA: Diagnosis not present

## 2021-08-17 MED ORDER — CYANOCOBALAMIN 1000 MCG/ML IJ SOLN
1000.0000 ug | Freq: Once | INTRAMUSCULAR | Status: AC
  Administered 2021-08-17: 1000 ug via INTRAMUSCULAR

## 2021-08-17 NOTE — Progress Notes (Signed)
Patient presented for B 12 injection given by Kashara Blocher, CMA to right deltoid, patient voiced no concerns nor showed any signs of distress during injection.  

## 2021-08-18 DIAGNOSIS — R519 Headache, unspecified: Secondary | ICD-10-CM | POA: Diagnosis not present

## 2021-08-18 DIAGNOSIS — H53149 Visual discomfort, unspecified: Secondary | ICD-10-CM | POA: Diagnosis not present

## 2021-08-18 DIAGNOSIS — F40298 Other specified phobia: Secondary | ICD-10-CM | POA: Diagnosis not present

## 2021-08-18 DIAGNOSIS — G43109 Migraine with aura, not intractable, without status migrainosus: Secondary | ICD-10-CM | POA: Diagnosis not present

## 2021-08-25 ENCOUNTER — Ambulatory Visit (INDEPENDENT_AMBULATORY_CARE_PROVIDER_SITE_OTHER): Payer: BC Managed Care – PPO

## 2021-08-25 DIAGNOSIS — E538 Deficiency of other specified B group vitamins: Secondary | ICD-10-CM | POA: Diagnosis not present

## 2021-08-25 MED ORDER — CYANOCOBALAMIN 1000 MCG/ML IJ SOLN
1000.0000 ug | Freq: Once | INTRAMUSCULAR | Status: AC
  Administered 2021-08-25: 1000 ug via INTRAMUSCULAR

## 2021-08-25 NOTE — Progress Notes (Signed)
Per orders of Katherine Clark, NP, injection of vit B12 given by Samanta Gal. Patient tolerated injection well.  

## 2021-08-31 DIAGNOSIS — R9089 Other abnormal findings on diagnostic imaging of central nervous system: Secondary | ICD-10-CM | POA: Diagnosis not present

## 2021-09-13 ENCOUNTER — Ambulatory Visit: Payer: BC Managed Care – PPO

## 2021-09-14 ENCOUNTER — Ambulatory Visit (INDEPENDENT_AMBULATORY_CARE_PROVIDER_SITE_OTHER): Payer: BC Managed Care – PPO

## 2021-09-14 DIAGNOSIS — E538 Deficiency of other specified B group vitamins: Secondary | ICD-10-CM

## 2021-09-14 MED ORDER — CYANOCOBALAMIN 1000 MCG/ML IJ SOLN
1000.0000 ug | Freq: Once | INTRAMUSCULAR | Status: AC
  Administered 2021-09-14: 1000 ug via INTRAMUSCULAR

## 2021-09-14 NOTE — Progress Notes (Signed)
Per orders of Allie Bossier, AGNP-C, injection of B-12 given by Barkley Bruns in right deltoid. Patient tolerated injection well.

## 2021-09-20 ENCOUNTER — Other Ambulatory Visit: Payer: Self-pay | Admitting: Primary Care

## 2021-09-20 DIAGNOSIS — G43909 Migraine, unspecified, not intractable, without status migrainosus: Secondary | ICD-10-CM

## 2021-09-20 DIAGNOSIS — R519 Headache, unspecified: Secondary | ICD-10-CM

## 2021-09-30 DIAGNOSIS — H6982 Other specified disorders of Eustachian tube, left ear: Secondary | ICD-10-CM | POA: Diagnosis not present

## 2021-10-04 DIAGNOSIS — F40298 Other specified phobia: Secondary | ICD-10-CM | POA: Diagnosis not present

## 2021-10-04 DIAGNOSIS — H53149 Visual discomfort, unspecified: Secondary | ICD-10-CM | POA: Diagnosis not present

## 2021-10-04 DIAGNOSIS — R519 Headache, unspecified: Secondary | ICD-10-CM | POA: Diagnosis not present

## 2021-10-04 DIAGNOSIS — G43909 Migraine, unspecified, not intractable, without status migrainosus: Secondary | ICD-10-CM | POA: Diagnosis not present

## 2021-10-17 ENCOUNTER — Other Ambulatory Visit: Payer: Self-pay | Admitting: Primary Care

## 2021-10-17 DIAGNOSIS — E559 Vitamin D deficiency, unspecified: Secondary | ICD-10-CM

## 2021-10-17 DIAGNOSIS — E538 Deficiency of other specified B group vitamins: Secondary | ICD-10-CM

## 2021-11-01 ENCOUNTER — Other Ambulatory Visit (INDEPENDENT_AMBULATORY_CARE_PROVIDER_SITE_OTHER): Payer: BC Managed Care – PPO

## 2021-11-01 DIAGNOSIS — E538 Deficiency of other specified B group vitamins: Secondary | ICD-10-CM | POA: Diagnosis not present

## 2021-11-01 DIAGNOSIS — E559 Vitamin D deficiency, unspecified: Secondary | ICD-10-CM

## 2021-11-01 LAB — VITAMIN D 25 HYDROXY (VIT D DEFICIENCY, FRACTURES): VITD: 55.12 ng/mL (ref 30.00–100.00)

## 2021-11-01 LAB — VITAMIN B12: Vitamin B-12: 277 pg/mL (ref 211–911)

## 2022-01-04 DIAGNOSIS — F40298 Other specified phobia: Secondary | ICD-10-CM | POA: Diagnosis not present

## 2022-01-04 DIAGNOSIS — H53149 Visual discomfort, unspecified: Secondary | ICD-10-CM | POA: Diagnosis not present

## 2022-01-04 DIAGNOSIS — G43109 Migraine with aura, not intractable, without status migrainosus: Secondary | ICD-10-CM | POA: Diagnosis not present

## 2022-01-04 DIAGNOSIS — R519 Headache, unspecified: Secondary | ICD-10-CM | POA: Diagnosis not present

## 2022-01-06 ENCOUNTER — Encounter: Payer: Self-pay | Admitting: Podiatry

## 2022-02-14 ENCOUNTER — Emergency Department: Payer: BC Managed Care – PPO

## 2022-02-14 ENCOUNTER — Emergency Department
Admission: EM | Admit: 2022-02-14 | Discharge: 2022-02-14 | Disposition: A | Discharge status: Home / Self Care | Payer: BC Managed Care – PPO | Attending: Emergency Medicine | Admitting: Emergency Medicine

## 2022-02-14 ENCOUNTER — Ambulatory Visit
Admission: RE | Admit: 2022-02-14 | Discharge: 2022-02-14 | Disposition: A | Discharge status: Home / Self Care | Payer: BC Managed Care – PPO | Source: Ambulatory Visit | Attending: Primary Care | Admitting: Primary Care

## 2022-02-14 ENCOUNTER — Other Ambulatory Visit: Payer: Self-pay

## 2022-02-14 DIAGNOSIS — R946 Abnormal results of thyroid function studies: Secondary | ICD-10-CM | POA: Insufficient documentation

## 2022-02-14 DIAGNOSIS — R002 Palpitations: Secondary | ICD-10-CM | POA: Insufficient documentation

## 2022-02-14 DIAGNOSIS — E0590 Thyrotoxicosis, unspecified without thyrotoxic crisis or storm: Secondary | ICD-10-CM | POA: Insufficient documentation

## 2022-02-14 DIAGNOSIS — R079 Chest pain, unspecified: Secondary | ICD-10-CM | POA: Insufficient documentation

## 2022-02-14 DIAGNOSIS — R000 Tachycardia, unspecified: Secondary | ICD-10-CM | POA: Diagnosis not present

## 2022-02-14 DIAGNOSIS — R0789 Other chest pain: Secondary | ICD-10-CM | POA: Diagnosis not present

## 2022-02-14 DIAGNOSIS — R7989 Other specified abnormal findings of blood chemistry: Secondary | ICD-10-CM

## 2022-02-14 LAB — BASIC METABOLIC PANEL
Anion gap: 4 — ABNORMAL LOW (ref 5–15)
BUN: 7 mg/dL (ref 6–20)
CO2: 27 mmol/L (ref 22–32)
Calcium: 8.6 mg/dL — ABNORMAL LOW (ref 8.9–10.3)
Chloride: 108 mmol/L (ref 98–111)
Creatinine, Ser: 0.59 mg/dL (ref 0.44–1.00)
GFR, Estimated: >60 mL/min (ref >60)
Glucose, Bld: 119 mg/dL — ABNORMAL HIGH (ref 70–99)
Potassium: 3.4 mmol/L — ABNORMAL LOW (ref 3.5–5.1)
Sodium: 139 mmol/L (ref 135–145)

## 2022-02-14 LAB — CBC WITH DIFFERENTIAL/PLATELET
Abs Immature Granulocytes: 0.01 10*3/uL (ref 0.00–0.07)
Basophils Absolute: 0 10*3/uL (ref 0.0–0.1)
Basophils Relative: 0 %
Eosinophils Absolute: 0.4 10*3/uL (ref 0.0–0.5)
Eosinophils Relative: 8 %
HCT: 37.2 % (ref 36.0–46.0)
Hemoglobin: 11.2 g/dL — ABNORMAL LOW (ref 12.0–15.0)
Immature Granulocytes: 0 %
Lymphocytes Relative: 28 %
Lymphs Abs: 1.4 10*3/uL (ref 0.7–4.0)
MCH: 24.8 pg — ABNORMAL LOW (ref 26.0–34.0)
MCHC: 30.1 g/dL (ref 30.0–36.0)
MCV: 82.3 fL (ref 80.0–100.0)
Monocytes Absolute: 0.6 10*3/uL (ref 0.1–1.0)
Monocytes Relative: 11 %
Neutro Abs: 2.6 10*3/uL (ref 1.7–7.7)
Neutrophils Relative %: 53 %
Platelets: 276 10*3/uL (ref 150–400)
RBC: 4.52 MIL/uL (ref 3.87–5.11)
RDW: 17.2 % — ABNORMAL HIGH (ref 11.5–15.5)
WBC: 5.1 10*3/uL (ref 4.0–10.5)
nRBC: 0 % (ref 0.0–0.2)

## 2022-02-14 LAB — T4, FREE: Free T4: 3.29 ng/dL — ABNORMAL HIGH (ref 0.61–1.12)

## 2022-02-14 LAB — TROPONIN I (HIGH SENSITIVITY)
Troponin I (High Sensitivity): 3 ng/L (ref <18)
Troponin I (High Sensitivity): 3 ng/L (ref <18)

## 2022-02-14 LAB — D-DIMER, QUANTITATIVE: D-Dimer, Quant: 0.35 ug/mL-FEU (ref 0.00–0.50)

## 2022-02-14 LAB — TSH: TSH: <0.01 u[IU]/mL — ABNORMAL LOW (ref 0.350–4.500)

## 2022-02-14 MED ORDER — ACETAMINOPHEN 500 MG PO TABS
1000.0000 mg | ORAL_TABLET | Freq: Once | ORAL | Status: AC
  Administered 2022-02-14: 1000 mg via ORAL
  Filled 2022-02-14: qty 2

## 2022-02-14 NOTE — ED Provider Notes (Signed)
[facility] Provider Note    Event Date/Time   First MD Initiated Contact with Patient 02/14/22 1525     (approximate)   History   Chest Pain   HPI  Danielle Maxwell is a 41 y.o. female with past medical history significant for anxiety/depression who presents to the emergency department with heart palpitations and chest pain.  Patient states that she has had a feeling like her heart was racing and just had some mild discomfort symptoms over the past couple of weeks.  States that she started following her heart rate on an app at home and it has slowly been increasing.  States that she sometimes feels like her heart is racing or skips a beat.  Feels it worse at nighttime.  This morning had some chest discomfort that she describes as a tightness feeling.  Denies any change with exertion.  Denies any nausea vomiting or diaphoresis.  Denies any abdominal pain.  Denies any leg swelling.  No recent trip or travel.  Does take estrogen for birth control.  Last menstrual period was a couple of days ago.  Patient was recently switched from Lexapro to venlafaxine.  No tobacco use.  No family history of coronary artery disease at young age.     Physical Exam   Triage Vital Signs: ED Triage Vitals  Enc Vitals Group     BP 02/14/22 1456 135/87     Pulse Rate 02/14/22 1456 (!) 108     Resp 02/14/22 1456 18     Temp 02/14/22 1458 98 F (36.7 C)     Temp Source 02/14/22 1458 Oral     SpO2 02/14/22 1456 100 %     Weight --      Height --      Head Circumference --      Peak Flow --      Pain Score 02/14/22 1456 3     Pain Loc --      Pain Edu? --      Excl. in GC? --     Most recent vital signs: Vitals:   02/14/22 1715 02/14/22 1730  BP:  133/79  Pulse: 99 100  Resp: (!) 25 (!) 24  Temp:    SpO2: 100% 100%    Physical Exam Constitutional:      Appearance: She is well-developed.  HENT:     Head: Atraumatic.  Eyes:     Conjunctiva/sclera: Conjunctivae normal.   Cardiovascular:     Rate and Rhythm: Regular rhythm. Tachycardia present.     Heart sounds: No murmur heard. Pulmonary:     Effort: No respiratory distress.     Breath sounds: No decreased breath sounds.  Abdominal:     General: There is no distension or abdominal bruit.  Musculoskeletal:        General: Normal range of motion.     Cervical back: Normal range of motion.     Right lower leg: No tenderness. No edema.     Left lower leg: No tenderness. No edema.  Skin:    General: Skin is warm.     Capillary Refill: Capillary refill takes 2 to 3 seconds.  Neurological:     Mental Status: She is alert. Mental status is at baseline.          IMPRESSION / MDM / ASSESSMENT AND PLAN / ED COURSE  I reviewed the triage vital signs and the nursing notes.  41 year old with past medical history significant for  depression, presents to the emergency department with tachycardia and chest pressure.  On arrival patient was tachycardic.  Hemodynamically stable.  Stable on room air.  Differential diagnosis including anemia, ACS, pulmonary embolism, thyroid disorder, electrolyte abnormality, pneumonia, pericarditis  Patient's only new medication and/change in medication was venlafaxine.  Low risk Wells criteria will obtain D-dimer    EKG  My interpretation of the EKG -sinus tachycardia normal intervals.  No chamber enlargement.  No significant ST elevation or depression.  No signs of acute ischemia or dysrhythmia.  No tachycardic or bradycardic dysrhythmias while on cardiac telemetry.  Sinus tachycardia on cardiac telemetry  RADIOLOGY I independently reviewed imaging, my interpretation of imaging: Chest x-ray with no focal findings consistent with pneumonia, no signs of pulmonary edema.  Read as no acute findings.      ED Results / Procedures / Treatments   Labs (all labs ordered are listed, but only abnormal results are displayed) Labs interpreted as -    Labs Reviewed  BASIC  METABOLIC PANEL - Abnormal; Notable for the following components:      Result Value   Potassium 3.4 (*)    Glucose, Bld 119 (*)    Calcium 8.6 (*)    Anion gap 4 (*)    All other components within normal limits  CBC WITH DIFFERENTIAL/PLATELET - Abnormal; Notable for the following components:   Hemoglobin 11.2 (*)    MCH 24.8 (*)    RDW 17.2 (*)    All other components within normal limits  TSH - Abnormal; Notable for the following components:   TSH <0.010 (*)    All other components within normal limits  T4, FREE - Abnormal; Notable for the following components:   Free T4 3.29 (*)    All other components within normal limits  D-DIMER, QUANTITATIVE (NOT AT [facility])  TROPONIN I (HIGH SENSITIVITY)  TROPONIN I (HIGH SENSITIVITY)     D-dimer negative, low suspicion for pulmonary embolism.  Free T4 is elevated with low TSH.  Symptoms most consistent with hyperthyroidism.  Patient has a primary care physician and states that she can get close follow-up.  Encouraged to call her tomorrow to set up an appointment.  Given return precautions for any worsening symptoms.  Low risk heart score, serial troponins negative.  Have a low suspicion for ACS.  PROCEDURES:  Critical Care performed: No  Procedures  Patient's presentation is most consistent with acute presentation with potential threat to life or bodily function.   MEDICATIONS ORDERED IN ED: Medications  acetaminophen (TYLENOL) tablet 1,000 mg (1,000 mg Oral Given 02/14/22 1717)    FINAL CLINICAL IMPRESSION(S) / ED DIAGNOSES   Final diagnoses:  Chest pain, unspecified type  Low TSH level  Tachycardia  Hyperthyroidism     Rx / DC Orders   ED Discharge Orders     None        Note:  This document was prepared using Dragon voice recognition software and may include unintentional dictation errors.   Nathaniel Man, MD 02/14/22 1836

## 2022-02-14 NOTE — ED Provider Triage Note (Signed)
  Emergency Medicine Provider Triage Evaluation Note  Danielle Maxwell , a 41 y.o.female,  was evaluated in triage.  Pt complains of chest pain, lightheadedness, and tachycardia.  Patient states that she has been having chest discomfort and feeling dizzy over the past few days.  She states that it has been fairly constant.  Denies any recent illnesses or injuries.   Review of Systems  Positive: Chest pain, lightheadedness Negative: Denies fever, chest pain, vomiting  Physical Exam  There were no vitals filed for this visit. Gen:   Awake, no distress   Resp:  Normal effort  MSK:   Moves extremities without difficulty  Other:    Medical Decision Making  Given the patient's initial medical screening exam, the following diagnostic evaluation has been ordered. The patient will be placed in the appropriate treatment space, once one is available, to complete the evaluation and treatment. I have discussed the plan of care with the patient and I have advised the patient that an ED physician or mid-level practitioner will reevaluate their condition after the test results have been received, as the results may give them additional insight into the type of treatment they may need.    Diagnostics: Labs, EKG, CXR  Treatments: none immediately   Teodoro Spray, [place] 02/14/22 1445

## 2022-02-14 NOTE — Discharge Instructions (Addendum)
You were seen in the emergency department for feeling like you are heart was racing and some chest pain.  Believe that you are having the symptoms from hyperthyroidism.  Is important that you call your primary care physician so they can start your work-up for hyperthyroidism.  Return to the emergency department if you have worsening of your symptoms.

## 2022-02-14 NOTE — ED Triage Notes (Signed)
Pt to ED via POV from home. Pt reports light headed, CP and high HR since Friday. Pt states HR reading highest at 104. No cardiac hx.

## 2022-02-17 ENCOUNTER — Ambulatory Visit: Payer: BC Managed Care – PPO | Admitting: Primary Care

## 2022-02-17 ENCOUNTER — Encounter: Payer: Self-pay | Admitting: Primary Care

## 2022-02-17 VITALS — BP 128/76 | HR 125 | Temp 98.4°F | Ht 63.75 in | Wt 158.0 lb

## 2022-02-17 DIAGNOSIS — Z1159 Encounter for screening for other viral diseases: Secondary | ICD-10-CM

## 2022-02-17 DIAGNOSIS — R7989 Other specified abnormal findings of blood chemistry: Secondary | ICD-10-CM

## 2022-02-17 DIAGNOSIS — R000 Tachycardia, unspecified: Secondary | ICD-10-CM | POA: Diagnosis not present

## 2022-02-17 HISTORY — DX: Tachycardia, unspecified: R00.0

## 2022-02-17 LAB — T4, FREE: Free T4: 3.13 ng/dL — ABNORMAL HIGH (ref 0.60–1.60)

## 2022-02-17 LAB — T3, FREE: T3, Free: 12.8 pg/mL — ABNORMAL HIGH (ref 2.3–4.2)

## 2022-02-17 MED ORDER — METOPROLOL SUCCINATE ER 25 MG PO TB24: 12.5000 mg | ORAL_TABLET | Freq: Every day | ORAL | 0 refills | Status: DC

## 2022-02-17 NOTE — Patient Instructions (Signed)
Stop by the lab prior to leaving today. I will notify you of your results once received.   Start metoprolol succinate medication for heart rate and palpitations. Take 1/2 to 1 tablet by mouth once daily in the morning.  You will be contacted regarding your referral to .  Please let us know if you have not been contacted within two business days.   It was a pleasure to see you today!

## 2022-02-17 NOTE — Progress Notes (Signed)
Subjective:    Patient ID: Danielle Maxwell, female    DOB: [birth year], 41 y.o.   MRN: [id number]  HPI  Danielle Maxwell is a very pleasant 41 y.o. female with a history of migraines, anxiety/depression, fatigue, insomnia who presents today for emergency department follow-up.  Evaluated at [facility] ED on 02/14/2022 for chest pain and palpitations that began a few weeks prior.  During her stay in the ED she underwent lab work which revealed mild hypokalemia, TSH of less than 0.010, free T4 of 3.29.  EKG without acute process.  D-dimer was negative.  Since her ED visit she continues to experience low, mid chest pain which is intermittent. Her palpitations are constant, mostly notices them at rest. She denies increased anxiety. Her migraines have improved. She denies anterior neck swelling, trouble swallowing, increased anxiety.   She was switched from Lexapro 10 mg to venlafaxine ER 37.5 mg per neurology on 02/01/22. Her palpitations began before initiation of venlafaxine. She was initiated on nortriptyline initially in September, took one dose and stopped given palpitations. She only took one total dose.   She's been checking her HR at home which is running 102-104 while at rest. She has family history of thyroid disease on her mother's side of the family in her aunts, isn't sure if hyper or hypo.   She was last evaluated in our office in April 2023 for her annual physical.  TSH at that time was 2.42.  No prior history of hyperthyroidism or hypothyroidism.   Review of Systems  HENT:  Negative for trouble swallowing.   Cardiovascular:  Positive for chest pain and palpitations.  Neurological:  Positive for tremors. Negative for headaches.  Psychiatric/Behavioral:  The patient is not nervous/anxious.          Past Medical History:  Diagnosis Date   Abnormal Pap smear 04/17/2000   Depression 04/17/2006   H/O chlamydia infection    H/O rubella    H/O urinary frequency 09/22/2002   History of  varicella    Hx: UTI (urinary tract infection) 04/17/2009   Migraine    Monilial vaginitis 06/12/2003   NSVD (normal spontaneous vaginal delivery) 12/31/2011   2227   Polyhydramnios in second trimester    Yeast infection     Social History   Socioeconomic History   Marital status: Married    Spouse name: Not on file   Number of children: Not on file   Years of education: Not on file   Highest education level: Not on file  Occupational History   Not on file  Tobacco Use   Smoking status: Never   Smokeless tobacco: Never  Vaping Use   Vaping Use: Never used  Substance and Sexual Activity   Alcohol use: Yes   Drug use: No   Sexual activity: Yes    Partners: Male    Birth control/protection: None  Other Topics Concern   Not on file  Social History Narrative   Married.   2 children.   Works at [employer].   Enjoys reading.    Social Determinants of Health   Financial Resource Strain: Not on file  Food Insecurity: Not on file  Transportation Needs: Not on file  Physical Activity: Not on file  Stress: Not on file  Social Connections: Not on file  Intimate Partner Violence: Not on file    Past Surgical History:  Procedure Laterality Date   CHOLECYSTECTOMY  07/17/2019   WISDOM TOOTH EXTRACTION      Family  History  Problem Relation Age of Onset   Diabetes Father    Hypertension Maternal Aunt    Hypertension Maternal Grandmother    Hypertension Maternal Grandfather    Diabetes Maternal Grandfather    Prostate cancer Maternal Grandfather    Diabetes Paternal Grandmother    Anesthesia problems Neg Hx     No Known Allergies  Current Outpatient Medications on File Prior to Visit  Medication Sig Dispense Refill   rizatriptan (MAXALT) 10 MG tablet Take 1/2 to 1 tablet by mouth at migraine onset. May repeat in 2 hours if needed. 10 tablet 0   topiramate (TOPAMAX) 50 MG tablet Take 1 tablet (50 mg total) by mouth at bedtime. For headache prevention. 90 tablet 0    venlafaxine (EFFEXOR) 37.5 MG tablet Take 37.5 mg by mouth daily.     XULANE 150-35 MCG/24HR transdermal patch Place 1 patch onto the skin every Sunday.      ondansetron (ZOFRAN ODT) 4 MG disintegrating tablet Take 1 tablet (4 mg total) by mouth every 8 (eight) hours as needed for nausea or vomiting. (Patient not taking: Reported on 02/17/2022) 20 tablet 0   No current facility-administered medications on file prior to visit.    BP 128/76   Pulse (!) 125   Temp 98.4 F (36.9 C) (Temporal)   Ht 5' 3.75" (1.619 m)   Wt 158 lb (71.7 kg)   SpO2 99%   BMI 27.33 kg/m  Objective:   Physical Exam Constitutional:      General: She is not in acute distress. Neck:     Thyroid: No thyroid mass, thyromegaly or thyroid tenderness.  Cardiovascular:     Rate and Rhythm: Tachycardia present.  Pulmonary:     Effort: Pulmonary effort is normal.     Breath sounds: Normal breath sounds.  Skin:    General: Skin is warm and dry.  Neurological:     Mental Status: She is alert.     Comments: Resting tremors noted to bilateral hands, right worse than left  Psychiatric:        Mood and Affect: Mood normal.           Assessment & Plan:   Problem List Items Addressed This Visit       Other   Low serum thyroid stimulating hormone (TSH) - Primary    Reviewed ED notes and labs from 02/14/22.  Symptoms, labs and presentation representative of hyperthyroidism, new onset. Overall appears stable in the office today.  Checking labs today including TSH, free T4, free T3, TSI's. Consider imaging once labs have returned.  Treat symptoms with metoprolol succinate 12.5 to 25 mg daily.  Urgent referral placed to endocrinology.      Relevant Orders   Ambulatory referral to Endocrinology   Thyroid Stimulating Immunoglobulin   T4, free   TSH   T3, Free   Sinus tachycardia    Secondary to new onset hyperthyroidism.  Start metoprolol succinate 12.5 to 25 mg daily.      Relevant Medications    metoprolol succinate (TOPROL-XL) 25 MG 24 hr tablet   Other Visit Diagnoses     Encounter for hepatitis C screening test for low risk patient       Relevant Orders   Hepatitis C Antibody          Doreene Nest, NP

## 2022-02-17 NOTE — Assessment & Plan Note (Signed)
Reviewed ED notes and labs from 02/14/22.  Symptoms, labs and presentation representative of hyperthyroidism, new onset. Overall appears stable in the office today.  Checking labs today including TSH, free T4, free T3, TSI's. Consider imaging once labs have returned.  Treat symptoms with metoprolol succinate 12.5 to 25 mg daily.  Urgent referral placed to endocrinology.

## 2022-02-17 NOTE — Assessment & Plan Note (Signed)
Secondary to new onset hyperthyroidism.  Start metoprolol succinate 12.5 to 25 mg daily.

## 2022-02-20 LAB — HEPATITIS C ANTIBODY: Hepatitis C Ab: NONREACTIVE

## 2022-02-20 LAB — TSH: TSH: <0.01 u[IU]/mL — ABNORMAL LOW (ref 0.35–5.50)

## 2022-02-21 DIAGNOSIS — G43109 Migraine with aura, not intractable, without status migrainosus: Secondary | ICD-10-CM | POA: Diagnosis not present

## 2022-02-21 DIAGNOSIS — H53149 Visual discomfort, unspecified: Secondary | ICD-10-CM | POA: Diagnosis not present

## 2022-02-21 DIAGNOSIS — F40298 Other specified phobia: Secondary | ICD-10-CM | POA: Diagnosis not present

## 2022-02-21 DIAGNOSIS — R519 Headache, unspecified: Secondary | ICD-10-CM | POA: Diagnosis not present

## 2022-02-21 LAB — THYROID STIMULATING IMMUNOGLOBULIN: TSI: 516 % baseline — ABNORMAL HIGH (ref <140)

## 2022-03-14 DIAGNOSIS — Z01419 Encounter for gynecological examination (general) (routine) without abnormal findings: Secondary | ICD-10-CM | POA: Diagnosis not present

## 2022-03-14 DIAGNOSIS — Z6826 Body mass index (BMI) 26.0-26.9, adult: Secondary | ICD-10-CM | POA: Diagnosis not present

## 2022-03-14 DIAGNOSIS — Z1231 Encounter for screening mammogram for malignant neoplasm of breast: Secondary | ICD-10-CM | POA: Diagnosis not present

## 2022-03-22 DIAGNOSIS — Z13220 Encounter for screening for lipoid disorders: Secondary | ICD-10-CM | POA: Diagnosis not present

## 2022-03-22 DIAGNOSIS — D509 Iron deficiency anemia, unspecified: Secondary | ICD-10-CM | POA: Diagnosis not present

## 2022-03-22 DIAGNOSIS — Z131 Encounter for screening for diabetes mellitus: Secondary | ICD-10-CM | POA: Diagnosis not present

## 2022-03-22 DIAGNOSIS — R002 Palpitations: Secondary | ICD-10-CM | POA: Diagnosis not present

## 2022-03-22 DIAGNOSIS — Z0000 Encounter for general adult medical examination without abnormal findings: Secondary | ICD-10-CM | POA: Diagnosis not present

## 2022-03-22 DIAGNOSIS — E0590 Thyrotoxicosis, unspecified without thyrotoxic crisis or storm: Secondary | ICD-10-CM | POA: Diagnosis not present

## 2022-03-25 ENCOUNTER — Other Ambulatory Visit: Payer: Self-pay | Admitting: Primary Care

## 2022-03-25 DIAGNOSIS — R000 Tachycardia, unspecified: Secondary | ICD-10-CM

## 2022-03-28 DIAGNOSIS — D509 Iron deficiency anemia, unspecified: Secondary | ICD-10-CM

## 2022-04-04 ENCOUNTER — Inpatient Hospital Stay: Payer: BC Managed Care – PPO | Attending: Oncology | Admitting: Oncology

## 2022-04-04 ENCOUNTER — Inpatient Hospital Stay: Payer: BC Managed Care – PPO

## 2022-04-04 ENCOUNTER — Encounter: Payer: Self-pay | Admitting: Oncology

## 2022-04-04 VITALS — BP 115/82 | HR 80 | Temp 97.7°F | Resp 16 | Wt 158.7 lb

## 2022-04-04 DIAGNOSIS — D649 Anemia, unspecified: Secondary | ICD-10-CM | POA: Insufficient documentation

## 2022-04-04 DIAGNOSIS — G43109 Migraine with aura, not intractable, without status migrainosus: Secondary | ICD-10-CM | POA: Diagnosis not present

## 2022-04-04 DIAGNOSIS — E0590 Thyrotoxicosis, unspecified without thyrotoxic crisis or storm: Secondary | ICD-10-CM | POA: Diagnosis not present

## 2022-04-04 DIAGNOSIS — E0500 Thyrotoxicosis with diffuse goiter without thyrotoxic crisis or storm: Secondary | ICD-10-CM | POA: Diagnosis not present

## 2022-04-04 DIAGNOSIS — R5383 Other fatigue: Secondary | ICD-10-CM | POA: Diagnosis not present

## 2022-04-04 DIAGNOSIS — L659 Nonscarring hair loss, unspecified: Secondary | ICD-10-CM | POA: Diagnosis not present

## 2022-04-04 LAB — COMPREHENSIVE METABOLIC PANEL
ALT: 8 U/L (ref 0–44)
AST: 17 U/L (ref 15–41)
Albumin: 3.3 g/dL — ABNORMAL LOW (ref 3.5–5.0)
Alkaline Phosphatase: 36 U/L — ABNORMAL LOW (ref 38–126)
Anion gap: 7 (ref 5–15)
BUN: <5 mg/dL — ABNORMAL LOW (ref 6–20)
CO2: 22 mmol/L (ref 22–32)
Calcium: 8.4 mg/dL — ABNORMAL LOW (ref 8.9–10.3)
Chloride: 110 mmol/L (ref 98–111)
Creatinine, Ser: 0.55 mg/dL (ref 0.44–1.00)
GFR, Estimated: >60 mL/min (ref >60)
Glucose, Bld: 110 mg/dL — ABNORMAL HIGH (ref 70–99)
Potassium: 3.4 mmol/L — ABNORMAL LOW (ref 3.5–5.1)
Sodium: 139 mmol/L (ref 135–145)
Total Bilirubin: 0.3 mg/dL (ref 0.3–1.2)
Total Protein: 6.9 g/dL (ref 6.5–8.1)

## 2022-04-04 LAB — CBC WITH DIFFERENTIAL/PLATELET
Abs Immature Granulocytes: 0.03 10*3/uL (ref 0.00–0.07)
Basophils Absolute: 0 10*3/uL (ref 0.0–0.1)
Basophils Relative: 1 %
Eosinophils Absolute: 0.4 10*3/uL (ref 0.0–0.5)
Eosinophils Relative: 9 %
HCT: 37.2 % (ref 36.0–46.0)
Hemoglobin: 11.6 g/dL — ABNORMAL LOW (ref 12.0–15.0)
Immature Granulocytes: 1 %
Lymphocytes Relative: 38 %
Lymphs Abs: 1.7 10*3/uL (ref 0.7–4.0)
MCH: 25 pg — ABNORMAL LOW (ref 26.0–34.0)
MCHC: 31.2 g/dL (ref 30.0–36.0)
MCV: 80.2 fL (ref 80.0–100.0)
Monocytes Absolute: 0.5 10*3/uL (ref 0.1–1.0)
Monocytes Relative: 11 %
Neutro Abs: 1.8 10*3/uL (ref 1.7–7.7)
Neutrophils Relative %: 40 %
Platelets: 333 10*3/uL (ref 150–400)
RBC: 4.64 MIL/uL (ref 3.87–5.11)
RDW: 17.8 % — ABNORMAL HIGH (ref 11.5–15.5)
WBC: 4.5 10*3/uL (ref 4.0–10.5)
nRBC: 0 % (ref 0.0–0.2)

## 2022-04-04 LAB — RETICULOCYTES
Immature Retic Fract: 11.4 % (ref 2.3–15.9)
RBC.: 4.69 MIL/uL (ref 3.87–5.11)
Retic Count, Absolute: 80.2 10*3/uL (ref 19.0–186.0)
Retic Ct Pct: 1.7 % (ref 0.4–3.1)

## 2022-04-04 LAB — FERRITIN: Ferritin: 5 ng/mL — ABNORMAL LOW (ref 11–307)

## 2022-04-04 LAB — FOLATE: Folate: 12.2 ng/mL (ref >5.9)

## 2022-04-04 LAB — LACTATE DEHYDROGENASE: LDH: 109 U/L (ref 98–192)

## 2022-04-04 NOTE — Progress Notes (Signed)
Recent unintentional 20 lb wt loss in the last 3 months.  Newly diagnosed with Graves disease and currently being followed by [facility] endocrinologist.    For the past couple of months having chest pain with palpitations.  The chest pains with palpations is what led her to ED and is treated by PCP.

## 2022-04-04 NOTE — Progress Notes (Signed)
Hematology/Oncology Consult note [facility] Telephone:([phone] [phone]  Patient Care Team: Doreene Nest, NP as PCP - General (Internal Medicine) Jaymes Graff, MD as Consulting Physician (Obstetrics and Gynecology) Talmage Coin, MD as Consulting Physician (Endocrinology)   Name of the patient: Danielle Maxwell  [id number]  02-Sep-1980    Reason for referral-anemia   Referring physician-Catherine Chestine Spore, NP  Date of visit: 04/04/22   History of presenting illness-patient is a 41 year old female recently diagnosed with hyperthyroidism currently on metoprolol has been referred for anemia.Patient has had mild normocytic anemia with a hemoglobin that fluctuates between 11-12 at least dating back to 2013.  No recent iron studies.  B12 levels in the past for low for which she took B12 injections transiently.  Presently patient is not on any B12 supplements.  Patient reports improvement in her palpitations after starting metoprolol but continues to report fatigue.  She has had 20 pound unintentional weight loss over the last 4 months.  Reports some drenching night sweats.  She has seen endocrinology recently as well.  Menstrual cycles are quite light on oral contraceptive pills.  ECOG PS- 0  Pain scale- 0   Review of systems- Review of Systems  Constitutional:  Positive for malaise/fatigue and weight loss. Negative for chills and fever.  HENT:  Negative for congestion, ear discharge and nosebleeds.   Eyes:  Negative for blurred vision.  Respiratory:  Negative for cough, hemoptysis, sputum production, shortness of breath and wheezing.   Cardiovascular:  Negative for chest pain, palpitations, orthopnea and claudication.  Gastrointestinal:  Negative for abdominal pain, blood in stool, constipation, diarrhea, heartburn, melena, nausea and vomiting.  Genitourinary:  Negative for dysuria, flank pain, frequency, hematuria and urgency.  Musculoskeletal:   Negative for back pain, joint pain and myalgias.  Skin:  Negative for rash.  Neurological:  Negative for dizziness, tingling, focal weakness, seizures, weakness and headaches.  Endo/Heme/Allergies:  Does not bruise/bleed easily.  Psychiatric/Behavioral:  Negative for depression and suicidal ideas. The patient does not have insomnia.     No Known Allergies  Patient Active Problem List   Diagnosis Date Noted   Low serum thyroid stimulating hormone (TSH) 02/17/2022   Sinus tachycardia 02/17/2022   Fatigue 07/27/2021   GERD (gastroesophageal reflux disease) 03/20/2019   Frequent headaches 01/14/2018   Insomnia 01/14/2018   Migraine 10/24/2017   Encounter for annual general medical examination with abnormal findings in adult 03/09/2015   Ganglion cyst of right foot 03/09/2015   Plantar fasciitis, bilateral 02/11/2014   Adjustment disorder with mixed anxiety and depressed mood 02/11/2014     Past Medical History:  Diagnosis Date   Abnormal Pap smear 04/17/2000   Depression 04/17/2006   H/O chlamydia infection    H/O rubella    H/O urinary frequency 09/22/2002   History of varicella    Hx: UTI (urinary tract infection) 04/17/2009   Migraine    Monilial vaginitis 06/12/2003   NSVD (normal spontaneous vaginal delivery) 12/31/2011   2227   Polyhydramnios in second trimester    Yeast infection      Past Surgical History:  Procedure Laterality Date   CHOLECYSTECTOMY  07/17/2019   WISDOM TOOTH EXTRACTION      Social History   Socioeconomic History   Marital status: Married    Spouse name: Not on file   Number of children: Not on file   Years of education: Not on file   Highest education level: Not on file  Occupational History  Not on file  Tobacco Use   Smoking status: Never   Smokeless tobacco: Never  Vaping Use   Vaping Use: Never used  Substance and Sexual Activity   Alcohol use: Yes   Drug use: No   Sexual activity: Yes    Partners: Male    Birth  control/protection: None  Other Topics Concern   Not on file  Social History Narrative   Married.   2 children.   Works at [employer].   Enjoys reading.    Social Determinants of Health   Financial Resource Strain: Not on file  Food Insecurity: Not on file  Transportation Needs: No Transportation Needs (04/04/2022)   PRAPARE - Administrator, Civil Service (Medical): No    Lack of Transportation (Non-Medical): No  Physical Activity: Not on file  Stress: Not on file  Social Connections: Not on file  Intimate Partner Violence: Not on file     Family History  Problem Relation Age of Onset   Diabetes Father    Hypertension Maternal Aunt    Hypertension Maternal Grandmother    Hypertension Maternal Grandfather    Diabetes Maternal Grandfather    Prostate cancer Maternal Grandfather    Diabetes Paternal Grandmother    Anesthesia problems Neg Hx      Current Outpatient Medications:    metoprolol succinate (TOPROL-XL) 25 MG 24 hr tablet, Take 0.5-1 tablets (12.5-25 mg total) by mouth daily. For heart rate and palpitations, Disp: 30 tablet, Rfl: 0   rizatriptan (MAXALT) 10 MG tablet, Take 1/2 to 1 tablet by mouth at migraine onset. May repeat in 2 hours if needed., Disp: 10 tablet, Rfl: 0   venlafaxine (EFFEXOR) 37.5 MG tablet, Take 37.5 mg by mouth daily., Disp: , Rfl:    XULANE 150-35 MCG/24HR transdermal patch, Place 1 patch onto the skin every Sunday. , Disp: , Rfl:    ondansetron (ZOFRAN ODT) 4 MG disintegrating tablet, Take 1 tablet (4 mg total) by mouth every 8 (eight) hours as needed for nausea or vomiting., Disp: 20 tablet, Rfl: 0   topiramate (TOPAMAX) 50 MG tablet, Take 1 tablet (50 mg total) by mouth at bedtime. For headache prevention. (Patient not taking: Reported on 04/04/2022), Disp: 90 tablet, Rfl: 0   Physical exam:  Vitals:   04/04/22 1000  BP: 115/82  Pulse: 80  Resp: 16  Temp: 97.7 F (36.5 C)  TempSrc: Tympanic  Weight: 158 lb 11.2 oz (72 kg)    Physical Exam Cardiovascular:     Rate and Rhythm: Normal rate and regular rhythm.     Heart sounds: Normal heart sounds.  Pulmonary:     Effort: Pulmonary effort is normal.     Breath sounds: Normal breath sounds.  Abdominal:     General: Bowel sounds are normal.     Palpations: Abdomen is soft.     Comments: No palpable hepatosplenomegaly  Lymphadenopathy:     Comments: No palpable cervical, supraclavicular, axillary or inguinal adenopathy    Skin:    General: Skin is warm and dry.  Neurological:     Mental Status: She is alert and oriented to person, place, and time.           Latest Ref Rng & Units 04/04/2022   11:20 AM  CMP  Glucose 70 - 99 mg/dL 409   BUN 6 - 20 mg/dL <5   Creatinine 8.11 - 1.00 mg/dL 9.14   Sodium 782 - 956 mmol/L 139   Potassium 3.5 -  5.1 mmol/L 3.4   Chloride 98 - 111 mmol/L 110   CO2 22 - 32 mmol/L 22   Calcium 8.9 - 10.3 mg/dL 8.4   Total Protein 6.5 - 8.1 g/dL 6.9   Total Bilirubin 0.3 - 1.2 mg/dL 0.3   Alkaline Phos 38 - 126 U/L 36   AST 15 - 41 U/L 17   ALT 0 - 44 U/L 8       Latest Ref Rng & Units 04/04/2022   11:20 AM  CBC  WBC 4.0 - 10.5 K/uL 4.5   Hemoglobin 12.0 - 15.0 g/dL 13.1   Hematocrit 43.8 - 46.0 % 37.2   Platelets 150 - 400 K/uL 333     Assessment and plan- Patient is a 41 y.o. female referred for normocytic anemia  Patient has had mild isolated normocytic anemia with a hemoglobin that has fluctuated between 11-12 at least for the last 4 to 5 years without a clear downward trend.  She has had some evidence of B12 deficiency in the past as well.  Today I will do a complete anemia workup including CBC ferritin and iron studies B12 folate reticulocyte count haptoglobin LDH myeloma panel and serum free light chains.  In person or video visit with the patient in 2 weeks time.  Suspect that patient's B symptoms including weight loss palpitations night sweats could be secondary to hyperthyroidism.  Clinically patient does  not have any palpable adenopathy or hepatosplenomegaly.  Likelihood of a primary bone marrow disorder causing anemia is low.   Thank you for this kind referral and the opportunity to participate in the care of this patient   Visit Diagnosis 1. Normocytic anemia     Dr. Owens Shark, MD, MPH [facility] at [facility] [phone] 04/04/2022

## 2022-04-05 LAB — HAPTOGLOBIN: Haptoglobin: 183 mg/dL (ref 42–296)

## 2022-04-05 LAB — VITAMIN B12: Vitamin B-12: 158 pg/mL — ABNORMAL LOW (ref 180–914)

## 2022-04-11 LAB — MULTIPLE MYELOMA PANEL, SERUM
Albumin SerPl Elph-Mcnc: 3.1 g/dL (ref 2.9–4.4)
Albumin/Glob SerPl: 1.1 (ref 0.7–1.7)
Alpha 1: 0.3 g/dL (ref 0.0–0.4)
Alpha2 Glob SerPl Elph-Mcnc: 0.7 g/dL (ref 0.4–1.0)
B-Globulin SerPl Elph-Mcnc: 0.9 g/dL (ref 0.7–1.3)
Gamma Glob SerPl Elph-Mcnc: 1.2 g/dL (ref 0.4–1.8)
Globulin, Total: 3.1 g/dL (ref 2.2–3.9)
IgA: 144 mg/dL (ref 87–352)
IgG (Immunoglobin G), Serum: 1341 mg/dL (ref 586–1602)
IgM (Immunoglobulin M), Srm: 126 mg/dL (ref 26–217)
Total Protein ELP: 6.2 g/dL (ref 6.0–8.5)

## 2022-04-14 DIAGNOSIS — E0590 Thyrotoxicosis, unspecified without thyrotoxic crisis or storm: Secondary | ICD-10-CM | POA: Diagnosis not present

## 2022-04-21 ENCOUNTER — Inpatient Hospital Stay: Payer: BC Managed Care – PPO | Attending: Oncology | Admitting: Oncology

## 2022-04-21 DIAGNOSIS — D509 Iron deficiency anemia, unspecified: Secondary | ICD-10-CM

## 2022-04-21 DIAGNOSIS — E0590 Thyrotoxicosis, unspecified without thyrotoxic crisis or storm: Secondary | ICD-10-CM | POA: Diagnosis not present

## 2022-04-21 DIAGNOSIS — E0500 Thyrotoxicosis with diffuse goiter without thyrotoxic crisis or storm: Secondary | ICD-10-CM | POA: Diagnosis not present

## 2022-04-21 DIAGNOSIS — E538 Deficiency of other specified B group vitamins: Secondary | ICD-10-CM

## 2022-04-21 DIAGNOSIS — E611 Iron deficiency: Secondary | ICD-10-CM | POA: Diagnosis not present

## 2022-04-21 DIAGNOSIS — E8809 Other disorders of plasma-protein metabolism, not elsewhere classified: Secondary | ICD-10-CM | POA: Diagnosis not present

## 2022-04-23 NOTE — Progress Notes (Signed)
I connected with Danielle Maxwell on 04/23/22 at  3:30 PM EST by video enabled telemedicine visit and verified that I am speaking with the correct person using two identifiers.   I discussed the limitations, risks, security and privacy concerns of performing an evaluation and management service by telemedicine and the availability of in-person appointments. I also discussed with the patient that there may be a patient responsible charge related to this service. The patient expressed understanding and agreed to proceed.  Other persons participating in the visit and their role in the encounter:  none  Patient's location:  home Provider's location:  work  Stage manager Complaint:  discuss results of blood work  History of present illness: patient is a 42 year old female recently diagnosed with hyperthyroidism currently on metoprolol has been referred for anemia.Patient has had mild normocytic anemia with a hemoglobin that fluctuates between 11-12 at least dating back to 2013.  No recent iron studies.  B12 levels in the past for low for which she took B12 injections transiently.  Presently patient is not on any B12 supplements.  Patient reports improvement in her palpitations after starting metoprolol but continues to report fatigue.  She has had 20 pound unintentional weight loss over the last 4 months.  Reports some drenching night sweats.  She has seen endocrinology recently as well.  Menstrual cycles are quite light on oral contraceptive pills.   Interval history no acute issues since last visit.   Review of Systems  Constitutional:  Negative for chills, fever, malaise/fatigue and weight loss.  HENT:  Negative for congestion, ear discharge and nosebleeds.   Eyes:  Negative for blurred vision.  Respiratory:  Negative for cough, hemoptysis, sputum production, shortness of breath and wheezing.   Cardiovascular:  Negative for chest pain, palpitations, orthopnea and claudication.  Gastrointestinal:  Negative for  abdominal pain, blood in stool, constipation, diarrhea, heartburn, melena, nausea and vomiting.  Genitourinary:  Negative for dysuria, flank pain, frequency, hematuria and urgency.  Musculoskeletal:  Negative for back pain, joint pain and myalgias.  Skin:  Negative for rash.  Neurological:  Negative for dizziness, tingling, focal weakness, seizures, weakness and headaches.  Endo/Heme/Allergies:  Does not bruise/bleed easily.  Psychiatric/Behavioral:  Negative for depression and suicidal ideas. The patient does not have insomnia.     No Known Allergies  Past Medical History:  Diagnosis Date   Abnormal Pap smear 04/17/2000   Depression 04/17/2006   H/O chlamydia infection    H/O rubella    H/O urinary frequency 09/22/2002   History of varicella    Hx: UTI (urinary tract infection) 04/17/2009   Migraine    Monilial vaginitis 06/12/2003   NSVD (normal spontaneous vaginal delivery) 12/31/2011   2227   Polyhydramnios in second trimester    Yeast infection     Past Surgical History:  Procedure Laterality Date   CHOLECYSTECTOMY  07/17/2019   WISDOM TOOTH EXTRACTION      Social History   Socioeconomic History   Marital status: Married    Spouse name: Not on file   Number of children: Not on file   Years of education: Not on file   Highest education level: Not on file  Occupational History   Not on file  Tobacco Use   Smoking status: Never   Smokeless tobacco: Never  Vaping Use   Vaping Use: Never used  Substance and Sexual Activity   Alcohol use: Yes   Drug use: No   Sexual activity: Yes    Partners: Male  Birth control/protection: None  Other Topics Concern   Not on file  Social History Narrative   Married.   2 children.   Works at [employer].   Enjoys reading.    Social Determinants of Health   Financial Resource Strain: Not on file  Food Insecurity: Not on file  Transportation Needs: No Transportation Needs (04/04/2022)   PRAPARE - Scientist, research (physical sciences) (Medical): No    Lack of Transportation (Non-Medical): No  Physical Activity: Not on file  Stress: Not on file  Social Connections: Not on file  Intimate Partner Violence: Not on file    Family History  Problem Relation Age of Onset   Diabetes Father    Hypertension Maternal Aunt    Hypertension Maternal Grandmother    Hypertension Maternal Grandfather    Diabetes Maternal Grandfather    Prostate cancer Maternal Grandfather    Diabetes Paternal Grandmother    Anesthesia problems Neg Hx      Current Outpatient Medications:    metoprolol succinate (TOPROL-XL) 25 MG 24 hr tablet, Take 0.5-1 tablets (12.5-25 mg total) by mouth daily. For heart rate and palpitations, Disp: 30 tablet, Rfl: 0   ondansetron (ZOFRAN ODT) 4 MG disintegrating tablet, Take 1 tablet (4 mg total) by mouth every 8 (eight) hours as needed for nausea or vomiting., Disp: 20 tablet, Rfl: 0   rizatriptan (MAXALT) 10 MG tablet, Take 1/2 to 1 tablet by mouth at migraine onset. May repeat in 2 hours if needed., Disp: 10 tablet, Rfl: 0   topiramate (TOPAMAX) 50 MG tablet, Take 1 tablet (50 mg total) by mouth at bedtime. For headache prevention. (Patient not taking: Reported on 04/04/2022), Disp: 90 tablet, Rfl: 0   venlafaxine (EFFEXOR) 37.5 MG tablet, Take 37.5 mg by mouth daily., Disp: , Rfl:    XULANE 150-35 MCG/24HR transdermal patch, Place 1 patch onto the skin every Sunday. , Disp: , Rfl:   No results found.  No images are attached to the encounter.      Latest Ref Rng & Units 04/04/2022   11:20 AM  CMP  Glucose 70 - 99 mg/dL 536   BUN 6 - 20 mg/dL <5   Creatinine 6.44 - 1.00 mg/dL 0.34   Sodium 742 - 595 mmol/L 139   Potassium 3.5 - 5.1 mmol/L 3.4   Chloride 98 - 111 mmol/L 110   CO2 22 - 32 mmol/L 22   Calcium 8.9 - 10.3 mg/dL 8.4   Total Protein 6.5 - 8.1 g/dL 6.9   Total Bilirubin 0.3 - 1.2 mg/dL 0.3   Alkaline Phos 38 - 126 U/L 36   AST 15 - 41 U/L 17   ALT 0 - 44 U/L 8        Latest Ref Rng & Units 04/04/2022   11:20 AM  CBC  WBC 4.0 - 10.5 K/uL 4.5   Hemoglobin 12.0 - 15.0 g/dL 63.8   Hematocrit 75.6 - 46.0 % 37.2   Platelets 150 - 400 K/uL 333      Observation/objective: Appears in no acute distress over video visit today.  Breathing is nonlabored  Assessment and plan: Patient is a 42 year old female referred for evaluation of normocytic anemia  Discuss the results of  anemia workup which shows evidence of both iron and B12 deficiency.  B12 levels are low at 277.  Iron studies showed a low ferritin of 5.  No evidence of hemolysis.  Myeloma panel showed no M protein.  Patient does not  have any evidence of menorrhagia and her menstrual cycles are usually light.  Patient has never tried oral B12 or oral iron. For now asked her to take over-the-counter ferrous sulfate 325 mg daily along with vitamin B12 1000 mcg daily.  I will repeat her labs in 2 months and see her for in person or video visit.  I will also refer the patient to GI for evaluation of iron deficiency anemia  Follow-up instructions: as above  I discussed the assessment and treatment plan with the patient. The patient was provided an opportunity to ask questions and all were answered. The patient agreed with the plan and demonstrated an understanding of the instructions.   The patient was advised to call back or seek an in-person evaluation if the symptoms worsen or if the condition fails to improve as anticipated.  I provided 12 minutes of face-to-face video visit time during this encounter, and > 50% was spent counseling as documented under my assessment & plan.  Visit Diagnosis: 1. Iron deficiency anemia, unspecified iron deficiency anemia type   2. B12 deficiency     Dr. Randa Evens, MD, MPH [facility] at [facility] Tel- [phone] 04/23/2022 2:22 PM

## 2022-04-28 DIAGNOSIS — L659 Nonscarring hair loss, unspecified: Secondary | ICD-10-CM | POA: Diagnosis not present

## 2022-04-28 DIAGNOSIS — E538 Deficiency of other specified B group vitamins: Secondary | ICD-10-CM | POA: Diagnosis not present

## 2022-04-28 DIAGNOSIS — E559 Vitamin D deficiency, unspecified: Secondary | ICD-10-CM | POA: Diagnosis not present

## 2022-04-28 DIAGNOSIS — R5383 Other fatigue: Secondary | ICD-10-CM | POA: Diagnosis not present

## 2022-04-28 DIAGNOSIS — G43109 Migraine with aura, not intractable, without status migrainosus: Secondary | ICD-10-CM | POA: Diagnosis not present

## 2022-04-28 DIAGNOSIS — E0500 Thyrotoxicosis with diffuse goiter without thyrotoxic crisis or storm: Secondary | ICD-10-CM | POA: Diagnosis not present

## 2022-04-28 DIAGNOSIS — D729 Disorder of white blood cells, unspecified: Secondary | ICD-10-CM | POA: Diagnosis not present

## 2022-05-05 DIAGNOSIS — E0500 Thyrotoxicosis with diffuse goiter without thyrotoxic crisis or storm: Secondary | ICD-10-CM | POA: Diagnosis not present

## 2022-05-05 DIAGNOSIS — R5383 Other fatigue: Secondary | ICD-10-CM | POA: Diagnosis not present

## 2022-05-05 DIAGNOSIS — L659 Nonscarring hair loss, unspecified: Secondary | ICD-10-CM | POA: Diagnosis not present

## 2022-05-05 DIAGNOSIS — G43109 Migraine with aura, not intractable, without status migrainosus: Secondary | ICD-10-CM | POA: Diagnosis not present

## 2022-05-23 DIAGNOSIS — E0590 Thyrotoxicosis, unspecified without thyrotoxic crisis or storm: Secondary | ICD-10-CM | POA: Diagnosis not present

## 2022-06-05 DIAGNOSIS — L659 Nonscarring hair loss, unspecified: Secondary | ICD-10-CM | POA: Diagnosis not present

## 2022-06-05 DIAGNOSIS — G43109 Migraine with aura, not intractable, without status migrainosus: Secondary | ICD-10-CM | POA: Diagnosis not present

## 2022-06-05 DIAGNOSIS — R5383 Other fatigue: Secondary | ICD-10-CM | POA: Diagnosis not present

## 2022-06-05 DIAGNOSIS — E611 Iron deficiency: Secondary | ICD-10-CM | POA: Diagnosis not present

## 2022-06-05 DIAGNOSIS — E0500 Thyrotoxicosis with diffuse goiter without thyrotoxic crisis or storm: Secondary | ICD-10-CM | POA: Diagnosis not present

## 2022-06-12 DIAGNOSIS — E0500 Thyrotoxicosis with diffuse goiter without thyrotoxic crisis or storm: Secondary | ICD-10-CM | POA: Diagnosis not present

## 2022-06-12 DIAGNOSIS — L659 Nonscarring hair loss, unspecified: Secondary | ICD-10-CM | POA: Diagnosis not present

## 2022-06-12 DIAGNOSIS — R5383 Other fatigue: Secondary | ICD-10-CM | POA: Diagnosis not present

## 2022-06-12 DIAGNOSIS — D729 Disorder of white blood cells, unspecified: Secondary | ICD-10-CM | POA: Diagnosis not present

## 2022-06-12 DIAGNOSIS — G43109 Migraine with aura, not intractable, without status migrainosus: Secondary | ICD-10-CM | POA: Diagnosis not present

## 2022-06-21 DIAGNOSIS — E0500 Thyrotoxicosis with diffuse goiter without thyrotoxic crisis or storm: Secondary | ICD-10-CM | POA: Diagnosis not present

## 2022-06-21 DIAGNOSIS — E0590 Thyrotoxicosis, unspecified without thyrotoxic crisis or storm: Secondary | ICD-10-CM | POA: Diagnosis not present

## 2022-06-27 ENCOUNTER — Ambulatory Visit: Payer: BC Managed Care – PPO | Admitting: Podiatry

## 2022-06-27 ENCOUNTER — Ambulatory Visit (INDEPENDENT_AMBULATORY_CARE_PROVIDER_SITE_OTHER): Payer: BC Managed Care – PPO

## 2022-06-27 ENCOUNTER — Encounter: Payer: Self-pay | Admitting: Podiatry

## 2022-06-27 VITALS — BP 154/89 | HR 80

## 2022-06-27 DIAGNOSIS — M2011 Hallux valgus (acquired), right foot: Secondary | ICD-10-CM | POA: Diagnosis not present

## 2022-06-27 MED ORDER — MELOXICAM 15 MG PO TABS: 15.0000 mg | ORAL_TABLET | Freq: Every day | ORAL | 1 refills | Status: DC

## 2022-06-27 NOTE — Progress Notes (Signed)
   Chief Complaint  Patient presents with   Foot Pain    "I'm having pain around the joint and across my other toes on my right foot. I need a note for work stating I can wear tennis shoes."     Subjective: 42 y.o. female presents today for follow-up evaluation of a symptomatic bunion to the right foot.  Patient states that she denies a history of injury.  Patient states that at work she is required to wear dress shoes which seem to aggravate her bunion to her right foot.  She is seeking to continue to pursue conservative treatment.  She is requesting a note for work today to allow her to wear tennis shoes that do not irritate the bunion deformity  Past Medical History:  Diagnosis Date   Abnormal Pap smear 04/17/2000   Depression 04/17/2006   H/O chlamydia infection    H/O rubella    H/O urinary frequency 09/22/2002   History of varicella    Hx: UTI (urinary tract infection) 04/17/2009   Migraine    Monilial vaginitis 06/12/2003   NSVD (normal spontaneous vaginal delivery) 12/31/2011   2227   Polyhydramnios in second trimester    Yeast infection    Past Surgical History:  Procedure Laterality Date   CHOLECYSTECTOMY  07/17/2019   WISDOM TOOTH EXTRACTION     No Known Allergies Mom   Objective: Physical Exam General: The patient is alert and oriented x3 in no acute distress.  Dermatology: Skin is cool, dry and supple bilateral lower extremities. Negative for open lesions or macerations.  Vascular: Palpable pedal pulses bilaterally. No edema or erythema noted. Capillary refill within normal limits.  Neurological: Epicritic and protective threshold grossly intact bilaterally.   Musculoskeletal Exam: Clinical evidence of a mild bunion deformity noted to the respective foot. There is moderate pain on palpation range of motion of the first MPJ. Lateral deviation of the hallux noted consistent with hallux abductovalgus.  Radiographic Exam RT foot 06/27/2022: Mildly increased  intermetatarsal angle.  Negative for any significant degenerative changes.  Joint spaces preserved.  Assessment: 1. HAV w/ bunion deformity right 2.  Intermittent inflammatory capsulitis first MTP right   Plan of Care:  1. Patient was evaluated.  2.  Note of medical necessity was provided for the patient to wear supportive tennis shoes/sneakers daily at work 3.  Continue meloxicam 15 mg daily as needed 4.  Return to clinic as needed  Edrick Kins, DPM [facility]  Dr. Edrick Kins, DPM    [address], [place] [address]                Office [phone]  Fax [phone]

## 2022-06-29 ENCOUNTER — Encounter: Payer: Self-pay | Admitting: Podiatry

## 2022-07-04 ENCOUNTER — Inpatient Hospital Stay: Payer: BC Managed Care – PPO

## 2022-07-04 ENCOUNTER — Other Ambulatory Visit: Payer: Self-pay

## 2022-07-04 ENCOUNTER — Encounter: Payer: Self-pay | Admitting: Emergency Medicine

## 2022-07-04 ENCOUNTER — Emergency Department
Admission: EM | Admit: 2022-07-04 | Discharge: 2022-07-04 | Disposition: A | Discharge status: Home / Self Care | Payer: BC Managed Care – PPO | Attending: Emergency Medicine | Admitting: Emergency Medicine

## 2022-07-04 DIAGNOSIS — Z1152 Encounter for screening for COVID-19: Secondary | ICD-10-CM | POA: Diagnosis not present

## 2022-07-04 DIAGNOSIS — R1013 Epigastric pain: Secondary | ICD-10-CM | POA: Diagnosis not present

## 2022-07-04 DIAGNOSIS — K529 Noninfective gastroenteritis and colitis, unspecified: Secondary | ICD-10-CM | POA: Diagnosis not present

## 2022-07-04 DIAGNOSIS — R112 Nausea with vomiting, unspecified: Secondary | ICD-10-CM | POA: Diagnosis present

## 2022-07-04 DIAGNOSIS — R9431 Abnormal electrocardiogram [ECG] [EKG]: Secondary | ICD-10-CM | POA: Diagnosis not present

## 2022-07-04 LAB — COMPREHENSIVE METABOLIC PANEL
ALT: 13 U/L (ref 0–44)
AST: 22 U/L (ref 15–41)
Albumin: 4.1 g/dL (ref 3.5–5.0)
Alkaline Phosphatase: 49 U/L (ref 38–126)
Anion gap: 11 (ref 5–15)
BUN: 8 mg/dL (ref 6–20)
CO2: 22 mmol/L (ref 22–32)
Calcium: 9.1 mg/dL (ref 8.9–10.3)
Chloride: 104 mmol/L (ref 98–111)
Creatinine, Ser: 0.9 mg/dL (ref 0.44–1.00)
GFR, Estimated: >60 mL/min (ref >60)
Glucose, Bld: 115 mg/dL — ABNORMAL HIGH (ref 70–99)
Potassium: 3.7 mmol/L (ref 3.5–5.1)
Sodium: 137 mmol/L (ref 135–145)
Total Bilirubin: 0.8 mg/dL (ref 0.3–1.2)
Total Protein: 8.7 g/dL — ABNORMAL HIGH (ref 6.5–8.1)

## 2022-07-04 LAB — CBC
HCT: 47.5 % — ABNORMAL HIGH (ref 36.0–46.0)
Hemoglobin: 14.7 g/dL (ref 12.0–15.0)
MCH: 26.9 pg (ref 26.0–34.0)
MCHC: 30.9 g/dL (ref 30.0–36.0)
MCV: 86.8 fL (ref 80.0–100.0)
Platelets: 319 10*3/uL (ref 150–400)
RBC: 5.47 MIL/uL — ABNORMAL HIGH (ref 3.87–5.11)
RDW: 15.9 % — ABNORMAL HIGH (ref 11.5–15.5)
WBC: 7.4 10*3/uL (ref 4.0–10.5)
nRBC: 0 % (ref 0.0–0.2)

## 2022-07-04 LAB — URINALYSIS, ROUTINE W REFLEX MICROSCOPIC
Bilirubin Urine: NEGATIVE
Glucose, UA: NEGATIVE mg/dL
Ketones, ur: 20 mg/dL — AB
Leukocytes,Ua: NEGATIVE
Nitrite: NEGATIVE
Protein, ur: 30 mg/dL — AB
Specific Gravity, Urine: 1.027 (ref 1.005–1.030)
pH: 5 (ref 5.0–8.0)

## 2022-07-04 LAB — RESP PANEL BY RT-PCR (RSV, FLU A&B, COVID)  RVPGX2
Influenza A by PCR: NEGATIVE
Influenza B by PCR: NEGATIVE
Resp Syncytial Virus by PCR: NEGATIVE
SARS Coronavirus 2 by RT PCR: NEGATIVE

## 2022-07-04 LAB — POC URINE PREG, ED: Preg Test, Ur: NEGATIVE

## 2022-07-04 LAB — TROPONIN I (HIGH SENSITIVITY): Troponin I (High Sensitivity): <2 ng/L (ref <18)

## 2022-07-04 LAB — LIPASE, BLOOD: Lipase: 37 U/L (ref 11–51)

## 2022-07-04 MED ORDER — ACETAMINOPHEN 500 MG PO TABS
1000.0000 mg | ORAL_TABLET | Freq: Once | ORAL | Status: AC
  Administered 2022-07-04: 1000 mg via ORAL
  Filled 2022-07-04: qty 2

## 2022-07-04 MED ORDER — ONDANSETRON 4 MG PO TBDP: 4.0000 mg | ORAL_TABLET | Freq: Three times a day (TID) | ORAL | 0 refills | Status: AC | PRN

## 2022-07-04 MED ORDER — SODIUM CHLORIDE 0.9 % IV BOLUS
1000.0000 mL | Freq: Once | INTRAVENOUS | Status: AC
  Administered 2022-07-04: 1000 mL via INTRAVENOUS

## 2022-07-04 MED ORDER — ONDANSETRON HCL 4 MG/2ML IJ SOLN
4.0000 mg | Freq: Once | INTRAMUSCULAR | Status: AC
  Administered 2022-07-04: 4 mg via INTRAVENOUS
  Filled 2022-07-04: qty 2

## 2022-07-04 MED ORDER — KETOROLAC TROMETHAMINE 15 MG/ML IJ SOLN
15.0000 mg | Freq: Once | INTRAMUSCULAR | Status: AC
  Administered 2022-07-04: 15 mg via INTRAVENOUS
  Filled 2022-07-04: qty 1

## 2022-07-04 MED ORDER — PANTOPRAZOLE SODIUM 40 MG IV SOLR
40.0000 mg | Freq: Once | INTRAVENOUS | Status: AC
  Administered 2022-07-04: 40 mg via INTRAVENOUS
  Filled 2022-07-04: qty 10

## 2022-07-04 MED ORDER — PANTOPRAZOLE SODIUM 20 MG PO TBEC: 20.0000 mg | DELAYED_RELEASE_TABLET | Freq: Every day | ORAL | 0 refills | Status: DC

## 2022-07-04 NOTE — ED Notes (Signed)
Pt tolerated PO.

## 2022-07-04 NOTE — ED Triage Notes (Signed)
Patient to ED for abd cramping with vomiting and diarrhea since 8pm last night. States she believes she has food poisoning.

## 2022-07-04 NOTE — ED Provider Notes (Signed)
Vitals:   07/04/22 1215 07/04/22 1502  BP: (!) 162/97 131/77  Pulse: (!) 106 90  Resp: 18 17  Temp: 98.8 F (37.1 C)   SpO2: 100% 97%   Patient resting comfortably.  Reports she is feeling better.  Able to eat and drink tolerating it well.  Comfortable with plan for discharge and careful return precautions discussed with the patient as well as plan prescription for Zofran.  Urinalysis sent for culture.  Does not have any obvious frank symptoms of UTI, but urinalysis is slightly questionable as to potential for infection.  If urine culture grows concerning pathogen would consider treatment  Return precautions and treatment recommendations and follow-up discussed with the patient who is agreeable with the plan.    Delman Kitten, MD 07/04/22 [phone]

## 2022-07-04 NOTE — ED Notes (Signed)
[place] provided for PO challenge.

## 2022-07-04 NOTE — ED Provider Notes (Signed)
[facility] Provider Note    Event Date/Time   First MD Initiated Contact with Patient 07/04/22 1222     (approximate)   History   Emesis   HPI  Danielle Maxwell is a 42 y.o. female with history of Graves' disease who comes in with concerns for nausea vomiting diarrhea.  She reports eating shrimp last night from fast food restaurant.  She reports having nausea vomiting diarrhea and epigastric abdominal discomfort that is since started.  She is already had her gallbladder removed.  She denies having this previously.  She denies any chest pain, shortness of breath.     Physical Exam   Triage Vital Signs: ED Triage Vitals [07/04/22 1215]  Enc Vitals Group     BP (!) 162/97     Pulse Rate (!) 106     Resp 18     Temp 98.8 F (37.1 C)     Temp Source Oral     SpO2 100 %     Weight      Height      Head Circumference      Peak Flow      Pain Score 9     Pain Loc      Pain Edu?      Excl. in [place]?     Most recent vital signs: Vitals:   07/04/22 1215  BP: (!) 162/97  Pulse: (!) 106  Resp: 18  Temp: 98.8 F (37.1 C)  SpO2: 100%     General: Awake, no distress.  CV:  Good peripheral perfusion.  Resp:  Normal effort.  Abd:  No distention.  Slight epigastric tenderness without any rebound or guarding Other:     ED Results / Procedures / Treatments   Labs (all labs ordered are listed, but only abnormal results are displayed) Labs Reviewed  COMPREHENSIVE METABOLIC PANEL - Abnormal; Notable for the following components:      Result Value   Glucose, Bld 115 (*)    Total Protein 8.7 (*)    All other components within normal limits  CBC - Abnormal; Notable for the following components:   RBC 5.47 (*)    HCT 47.5 (*)    RDW 15.9 (*)    All other components within normal limits  RESP PANEL BY RT-PCR (RSV, FLU A&B, COVID)  RVPGX2  LIPASE, BLOOD  URINALYSIS, ROUTINE W REFLEX MICROSCOPIC  POC URINE PREG, ED  TROPONIN I (HIGH SENSITIVITY)   TROPONIN I (HIGH SENSITIVITY)     EKG  My interpretation of EKG:  Normal sinus rate 99 without any ST elevation or T wave inversions, normal intervals    PROCEDURES:  Critical Care performed: No  Procedures   MEDICATIONS ORDERED IN ED: Medications  sodium chloride 0.9 % bolus 1,000 mL (has no administration in time range)  ondansetron (ZOFRAN) injection 4 mg (has no administration in time range)  pantoprazole (PROTONIX) injection 40 mg (has no administration in time range)  acetaminophen (TYLENOL) tablet 1,000 mg (has no administration in time range)     IMPRESSION / MDM / ASSESSMENT AND PLAN / ED COURSE  I reviewed the triage vital signs and the nursing notes.   Patient's presentation is most consistent with acute presentation with potential threat to life or bodily function.   Patient comes in with nausea vomiting diarrhea that started last night suspect most likely gastroenteritis.  She reports some mild epigastric pain already had her gallbladder removed.  On examination is got  no rebound or guarding.  Discussed x-ray but seems very low suspicion for perforation can hold off at this time she has no lower abdominal pain to suggest appendicitis, diverticulitis.  Denies any antibiotics to suggest C. difficile.  Lipase is normal COVID flu were negative.  CMP reassuring CBC is normal troponin was negative  On repeat assessment patient has not had any diarrhea since being here.  She still has a little bit of queasiness in her stomach but repeat abdominal exam remains soft and nontender except for slight tenderness in her epigastric region with again out any rebound or guarding.  We again discussed imaging and patient feels comfortable holding off.  Patient will be given some Toradol, recheck vitals and p.o. challenge.  Suspect patient will be discharged home.    FINAL CLINICAL IMPRESSION(S) / ED DIAGNOSES   Final diagnoses:  Gastroenteritis     Rx / DC Orders   ED  Discharge Orders     None        Note:  This document was prepared using Dragon voice recognition software and may include unintentional dictation errors.   Vanessa , MD 07/04/22 1450

## 2022-07-04 NOTE — Discharge Instructions (Signed)
Take Zofran to help with nausea.  Tylenol 1 g every 8 hours.  Acid reducer to help with acid and return to the ER for worsening symptoms or any other concerns  At this time we have opted to hold off on CT imaging given no lower abdominal pain but if you develop lower pain please return to the ER for repeat evaluation

## 2022-07-05 ENCOUNTER — Inpatient Hospital Stay: Payer: BC Managed Care – PPO | Admitting: Oncology

## 2022-07-05 LAB — URINE CULTURE: Culture: <10000 — AB

## 2022-07-21 ENCOUNTER — Inpatient Hospital Stay: Payer: BC Managed Care – PPO | Attending: Oncology

## 2022-07-21 ENCOUNTER — Other Ambulatory Visit: Payer: Self-pay | Admitting: *Deleted

## 2022-07-21 DIAGNOSIS — D509 Iron deficiency anemia, unspecified: Secondary | ICD-10-CM

## 2022-07-21 DIAGNOSIS — E538 Deficiency of other specified B group vitamins: Secondary | ICD-10-CM

## 2022-07-21 LAB — CBC
HCT: 39.9 % (ref 36.0–46.0)
Hemoglobin: 12.6 g/dL (ref 12.0–15.0)
MCH: 27.5 pg (ref 26.0–34.0)
MCHC: 31.6 g/dL (ref 30.0–36.0)
MCV: 87.1 fL (ref 80.0–100.0)
Platelets: 324 10*3/uL (ref 150–400)
RBC: 4.58 MIL/uL (ref 3.87–5.11)
RDW: 14.9 % (ref 11.5–15.5)
WBC: 5.5 10*3/uL (ref 4.0–10.5)
nRBC: 0 % (ref 0.0–0.2)

## 2022-07-21 LAB — IRON AND TIBC
Iron: 33 ug/dL (ref 28–170)
Saturation Ratios: 7 % — ABNORMAL LOW (ref 10.4–31.8)
TIBC: 503 ug/dL — ABNORMAL HIGH (ref 250–450)
UIBC: 470 ug/dL

## 2022-07-21 LAB — VITAMIN B12: Vitamin B-12: 188 pg/mL (ref 180–914)

## 2022-07-21 LAB — FERRITIN: Ferritin: 11 ng/mL (ref 11–307)

## 2022-07-24 ENCOUNTER — Inpatient Hospital Stay (HOSPITAL_BASED_OUTPATIENT_CLINIC_OR_DEPARTMENT_OTHER): Payer: BC Managed Care – PPO | Admitting: Oncology

## 2022-07-24 DIAGNOSIS — E538 Deficiency of other specified B group vitamins: Secondary | ICD-10-CM | POA: Diagnosis not present

## 2022-07-24 DIAGNOSIS — D509 Iron deficiency anemia, unspecified: Secondary | ICD-10-CM

## 2022-07-24 DIAGNOSIS — Z8042 Family history of malignant neoplasm of prostate: Secondary | ICD-10-CM

## 2022-07-24 MED ORDER — "SYRINGE 25G X 1"" 3 ML MISC": 1.0000 | 0 refills | Status: DC

## 2022-07-24 MED ORDER — CYANOCOBALAMIN 1000 MCG/ML IJ SOLN: 1000.0000 ug | INTRAMUSCULAR | 11 refills | Status: DC

## 2022-07-25 ENCOUNTER — Encounter: Payer: Self-pay | Admitting: Oncology

## 2022-07-25 DIAGNOSIS — E0590 Thyrotoxicosis, unspecified without thyrotoxic crisis or storm: Secondary | ICD-10-CM | POA: Diagnosis not present

## 2022-07-25 NOTE — Progress Notes (Signed)
I connected with Danielle Maxwell on 07/25/22 at  3:15 PM EDT by video enabled telemedicine visit and verified that I am speaking with the correct person using two identifiers.   I discussed the limitations, risks, security and privacy concerns of performing an evaluation and management service by telemedicine and the availability of in-person appointments. I also discussed with the patient that there may be a patient responsible charge related to this service. The patient expressed understanding and agreed to proceed.  Other persons participating in the visit and their role in the encounter:  none  Patient's location:  home Provider's location:  work  Stage manager Complaint: Routine follow-up of iron deficiency and B12 deficiency anemia  History of present illness:  patient is a 42 year old female recently diagnosed with hyperthyroidism currently on metoprolol has been referred for anemia.Patient has had mild normocytic anemia with a hemoglobin that fluctuates between 11-12 at least dating back to 2013.  No recent iron studies.  B12 levels in the past for low for which she took B12 injections transiently.  Presently patient is not on any B12 supplements.  Patient reports improvement in her palpitations after starting metoprolol but continues to report fatigue.  She has had 20 pound unintentional weight loss over the last 4 months.  Reports some drenching night sweats.  She has seen endocrinology recently as well.  Menstrual cycles are quite light on oral contraceptive pills.   Interval history patient feels well.  Denies any specific complaints at this time.  Menstrual cycles are regular and not heavy.  She has an upcoming GI appointment next month   Review of Systems  Constitutional:  Negative for chills, fever, malaise/fatigue and weight loss.  HENT:  Negative for congestion, ear discharge and nosebleeds.   Eyes:  Negative for blurred vision.  Respiratory:  Negative for cough, hemoptysis, sputum  production, shortness of breath and wheezing.   Cardiovascular:  Negative for chest pain, palpitations, orthopnea and claudication.  Gastrointestinal:  Negative for abdominal pain, blood in stool, constipation, diarrhea, heartburn, melena, nausea and vomiting.  Genitourinary:  Negative for dysuria, flank pain, frequency, hematuria and urgency.  Musculoskeletal:  Negative for back pain, joint pain and myalgias.  Skin:  Negative for rash.  Neurological:  Negative for dizziness, tingling, focal weakness, seizures, weakness and headaches.  Endo/Heme/Allergies:  Does not bruise/bleed easily.  Psychiatric/Behavioral:  Negative for depression and suicidal ideas. The patient does not have insomnia.     No Known Allergies  Past Medical History:  Diagnosis Date   Abnormal Pap smear 04/17/2000   Depression 04/17/2006   H/O chlamydia infection    H/O rubella    H/O urinary frequency 09/22/2002   History of varicella    Hx: UTI (urinary tract infection) 04/17/2009   Migraine    Monilial vaginitis 06/12/2003   NSVD (normal spontaneous vaginal delivery) 12/31/2011   2227   Polyhydramnios in second trimester    Yeast infection     Past Surgical History:  Procedure Laterality Date   CHOLECYSTECTOMY  07/17/2019   WISDOM TOOTH EXTRACTION      Social History   Socioeconomic History   Marital status: Married    Spouse name: Not on file   Number of children: Not on file   Years of education: Not on file   Highest education level: Not on file  Occupational History   Not on file  Tobacco Use   Smoking status: Never   Smokeless tobacco: Never  Vaping Use   Vaping Use: Never used  Substance and Sexual Activity   Alcohol use: Not Currently   Drug use: No   Sexual activity: Yes    Partners: Male    Birth control/protection: None  Other Topics Concern   Not on file  Social History Narrative   Married.   2 children.   Works at [employer].   Enjoys reading.    Social Determinants of  Health   Financial Resource Strain: Not on file  Food Insecurity: Not on file  Transportation Needs: No Transportation Needs (04/04/2022)   PRAPARE - Administrator, Civil Service (Medical): No    Lack of Transportation (Non-Medical): No  Physical Activity: Not on file  Stress: Not on file  Social Connections: Not on file  Intimate Partner Violence: Not on file    Family History  Problem Relation Age of Onset   Diabetes Father    Hypertension Maternal Aunt    Hypertension Maternal Grandmother    Hypertension Maternal Grandfather    Diabetes Maternal Grandfather    Prostate cancer Maternal Grandfather    Diabetes Paternal Grandmother    Anesthesia problems Neg Hx      Current Outpatient Medications:    cyanocobalamin (VITAMIN B12) 1000 MCG/ML injection, Inject 1 mL (1,000 mcg total) into the muscle every 30 (thirty) days., Disp: 1 mL, Rfl: 11   Syringe/Needle, Disp, (SYRINGE 3CC/25GX1") 25G X 1" 3 ML MISC, 1 Syringe by Does not apply route every 30 (thirty) days., Disp: 12 each, Rfl: 0   meloxicam (MOBIC) 15 MG tablet, Take 1 tablet (15 mg total) by mouth daily., Disp: 30 tablet, Rfl: 1   metoprolol succinate (TOPROL-XL) 25 MG 24 hr tablet, Take 0.5-1 tablets (12.5-25 mg total) by mouth daily. For heart rate and palpitations, Disp: 30 tablet, Rfl: 0   pantoprazole (PROTONIX) 20 MG tablet, Take 1 tablet (20 mg total) by mouth daily for 14 days., Disp: 14 tablet, Rfl: 0   rizatriptan (MAXALT) 10 MG tablet, Take 1/2 to 1 tablet by mouth at migraine onset. May repeat in 2 hours if needed., Disp: 10 tablet, Rfl: 0   topiramate (TOPAMAX) 50 MG tablet, Take 1 tablet (50 mg total) by mouth at bedtime. For headache prevention., Disp: 90 tablet, Rfl: 0   venlafaxine (EFFEXOR) 37.5 MG tablet, Take 37.5 mg by mouth daily., Disp: , Rfl:    XULANE 150-35 MCG/24HR transdermal patch, Place 1 patch onto the skin every Sunday. , Disp: , Rfl:   DG Foot Complete Right  Result Date:  06/27/2022 Please see detailed radiograph report in office note.   No images are attached to the encounter.      Latest Ref Rng & Units 07/04/2022   12:17 PM  CMP  Glucose 70 - 99 mg/dL 539   BUN 6 - 20 mg/dL 8   Creatinine 7.67 - 3.41 mg/dL 9.37   Sodium 902 - 409 mmol/L 137   Potassium 3.5 - 5.1 mmol/L 3.7   Chloride 98 - 111 mmol/L 104   CO2 22 - 32 mmol/L 22   Calcium 8.9 - 10.3 mg/dL 9.1   Total Protein 6.5 - 8.1 g/dL 8.7   Total Bilirubin 0.3 - 1.2 mg/dL 0.8   Alkaline Phos 38 - 126 U/L 49   AST 15 - 41 U/L 22   ALT 0 - 44 U/L 13       Latest Ref Rng & Units 07/21/2022    8:10 AM  CBC  WBC 4.0 - 10.5 K/uL 5.5   Hemoglobin  12.0 - 15.0 g/dL 16.112.6   Hematocrit 09.636.0 - 46.0 % 39.9   Platelets 150 - 400 K/uL 324      Observation/objective: Appears in no acute distress over video visit today.  Breathing is nonlabored  Assessment and plan: Patient is 42 year old female and this is a routine follow-up visit for iron and B12 deficiency anemia  Iron deficiency anemia: Patient is not presently anemic with a hemoglobin of 12.6.  However iron studies are still indicated of iron deficiency with a ferritin level of 11 and iron saturation of 7%.  She will be continuing oral iron for 3 more months before deciding if she wants to proceed with IV iron.  She has GI appointment coming up next month  B12 deficiency: Despite oral B12 for 2 months, her B12 levels remain low at 188.  She is agreeable to self-administered B12 injections once a month and we will send the prescriptions for the same  Follow-up instructions: CBC ferritin and iron studies and B12 levels in 3 and 6 months and I will see her back in 6 months  I discussed the assessment and treatment plan with the patient. The patient was provided an opportunity to ask questions and all were answered. The patient agreed with the plan and demonstrated an understanding of the instructions.   The patient was advised to call back or seek an  in-person evaluation if the symptoms worsen or if the condition fails to improve as anticipated.  I provided 11 minutes of face-to-face video visit time during this encounter, and > 50% was spent counseling as documented under my assessment & plan.  Visit Diagnosis: 1. Iron deficiency anemia, unspecified iron deficiency anemia type   2. B12 deficiency     Dr. Owens SharkArchana Ronnett Pullin, MD, MPH [facility] at [facility] Tel- [phone] 07/25/2022 8:50 AM

## 2022-07-26 DIAGNOSIS — E0500 Thyrotoxicosis with diffuse goiter without thyrotoxic crisis or storm: Secondary | ICD-10-CM | POA: Diagnosis not present

## 2022-07-26 DIAGNOSIS — G43109 Migraine with aura, not intractable, without status migrainosus: Secondary | ICD-10-CM | POA: Diagnosis not present

## 2022-07-26 DIAGNOSIS — R5383 Other fatigue: Secondary | ICD-10-CM | POA: Diagnosis not present

## 2022-08-11 ENCOUNTER — Other Ambulatory Visit: Payer: Self-pay

## 2022-08-11 DIAGNOSIS — K5904 Chronic idiopathic constipation: Secondary | ICD-10-CM | POA: Insufficient documentation

## 2022-08-11 DIAGNOSIS — K589 Irritable bowel syndrome without diarrhea: Secondary | ICD-10-CM | POA: Insufficient documentation

## 2022-08-11 DIAGNOSIS — R1013 Epigastric pain: Secondary | ICD-10-CM | POA: Insufficient documentation

## 2022-08-11 HISTORY — DX: Epigastric pain: R10.13

## 2022-08-14 ENCOUNTER — Ambulatory Visit: Payer: BC Managed Care – PPO | Admitting: Gastroenterology

## 2022-08-14 ENCOUNTER — Other Ambulatory Visit: Payer: Self-pay

## 2022-08-14 ENCOUNTER — Encounter: Payer: Self-pay | Admitting: Gastroenterology

## 2022-08-14 VITALS — BP 140/92 | HR 96 | Temp 97.5°F | Ht 63.0 in | Wt 167.4 lb

## 2022-08-14 DIAGNOSIS — E611 Iron deficiency: Secondary | ICD-10-CM

## 2022-08-14 MED ORDER — NA SULFATE-K SULFATE-MG SULF 17.5-3.13-1.6 GM/177ML PO SOLN: 354.0000 mL | Freq: Once | ORAL | 0 refills | Status: AC

## 2022-08-14 NOTE — Progress Notes (Unsigned)
Wyline Mood MD, MRCP(U.K) [address]  [address]  [place], [place] [address]  Main: [phone]  Fax: [phone]   Gastroenterology Consultation  Referring Provider:     Dr Smith Robert  Primary Care Physician:  Doreene Nest, NP Primary Gastroenterologist:  Dr. Wyline Mood  Reason for Consultation:     Iron deficiency anemia         HPI:   Danielle Maxwell is a 42 y.o. y/o female referred for iron deficiency anemia.    She sees Dr Smith Robert in hematology for iron deficiency anemia and b12 deficiency , 20 lbs unintentional weight loss over 4 months.  Denies any change in bowel habits, denies any change of appetite   Iron/TIBC/Ferritin/ %Sat    Component Value Date/Time   IRON 33 07/21/2022 0810   TIBC 503 (H) 07/21/2022 0810   FERRITIN 11 07/21/2022 0810   IRONPCTSAT 7 (L) 07/21/2022 0810      Latest Ref Rng & Units 07/21/2022    8:10 AM 07/04/2022   12:17 PM 04/04/2022   11:20 AM  CBC  WBC 4.0 - 10.5 K/uL 5.5  7.4  4.5   Hemoglobin 12.0 - 15.0 g/dL 13.0  86.5  78.4   Hematocrit 36.0 - 46.0 % 39.9  47.5  37.2   Platelets 150 - 400 K/uL 324  319  333   No prior EGD or colonoscopy     Rectal bleeding: No Nose bleeds: No Vaginal bleeding : Normal.'s Hematemesis or hemoptysis : No Blood in urine : No Gastric Bypass: No NSAID use no     Past Medical History:  Diagnosis Date   Abnormal Pap smear 04/17/2000   Depression 04/17/2006   H/O chlamydia infection    H/O rubella    H/O urinary frequency 09/22/2002   History of varicella    Hx: UTI (urinary tract infection) 04/17/2009   Migraine    Monilial vaginitis 06/12/2003   NSVD (normal spontaneous vaginal delivery) 12/31/2011   2227   Polyhydramnios in second trimester    Yeast infection     Past Surgical History:  Procedure Laterality Date   CHOLECYSTECTOMY  07/17/2019   WISDOM TOOTH EXTRACTION      Prior to Admission medications   Medication Sig Start Date End Date Taking? Authorizing Provider   cyanocobalamin (VITAMIN B12) 1000 MCG/ML injection Inject 1 mL (1,000 mcg total) into the muscle every 30 (thirty) days. 07/24/22   Creig Hines, MD  meloxicam (MOBIC) 15 MG tablet Take 1 tablet (15 mg total) by mouth daily. 06/27/22   Felecia Shelling, DPM  methimazole (TAPAZOLE) 10 MG tablet Take 10 mg by mouth daily. 05/05/22   [provider]  methimazole (TAPAZOLE) 5 MG tablet Take 5 mg by mouth daily. 07/22/22   [provider]  metoprolol succinate (TOPROL-XL) 50 MG 24 hr tablet Take 50 mg by mouth daily.    [provider]  pantoprazole (PROTONIX) 20 MG tablet Take 1 tablet (20 mg total) by mouth daily for 14 days. 07/04/22 07/18/22  Concha Se, MD  rizatriptan (MAXALT) 10 MG tablet Take 1/2 to 1 tablet by mouth at migraine onset. May repeat in 2 hours if needed. 08/16/21   Doreene Nest, NP  Syringe/Needle, Disp, (SYRINGE 3CC/25GX1") 25G X 1" 3 ML MISC 1 Syringe by Does not apply route every 30 (thirty) days. 07/24/22   Creig Hines, MD  venlafaxine (EFFEXOR) 37.5 MG tablet Take 37.5 mg by mouth daily.  [provider]  Burr Medico 409-81 MCG/24HR transdermal patch Place 1 patch onto the skin every Sunday.  05/23/19   [provider]    Family History  Problem Relation Age of Onset   Diabetes Father    Hypertension Maternal Aunt    Hypertension Maternal Grandmother    Hypertension Maternal Grandfather    Diabetes Maternal Grandfather    Prostate cancer Maternal Grandfather    Diabetes Paternal Grandmother    Anesthesia problems Neg Hx      Social History   Tobacco Use   Smoking status: Never   Smokeless tobacco: Never  Vaping Use   Vaping Use: Never used  Substance Use Topics   Alcohol use: Not Currently   Drug use: No    Allergies as of 08/14/2022   (No Known Allergies)    Review of Systems:    All systems reviewed and negative except where noted in HPI.   Physical Exam:  BP (!) 142/95   Pulse 84   Temp (!) 97.5 F (36.4  C) (Oral)   Ht 5\' 3"  (1.6 m)   Wt 167 lb 6.4 oz (75.9 kg)   BMI 29.65 kg/m  No LMP recorded. Psych:  Alert and cooperative. Normal mood and affect. General:   Alert,  Well-developed, well-nourished, pleasant and cooperative in NAD Head:  Normocephalic and atraumatic. Eyes:  Sclera clear, no icterus.   Conjunctiva pink. Ears:  Normal auditory acuity. Neurologic:  Alert and oriented x3;  grossly normal neurologically. Psych:  Alert and cooperative. Normal mood and affect.  Imaging Studies: No results found.  Assessment and Plan:   Danielle Maxwell is a 42 y.o. y/o female has been referred for iron deficiency anemia. 20 lbs weight loss.  Plan  Check celiac serology and urine .Proceed with EGD+colonoscopy if negative can consider capsule study of the small bowel  Follow up with Dr Smith Robert for iron deficiency as well as weight loss.   I have discussed alternative options, risks & benefits,  which include, but are not limited to, bleeding, infection, perforation,respiratory complication & drug reaction.  The patient agrees with this plan & written consent will be obtained.     Follow up in 3-4 months   Dr Wyline Mood MD,MRCP(U.K)

## 2022-08-15 LAB — URINALYSIS
Bilirubin, UA: NEGATIVE
Glucose, UA: NEGATIVE
Leukocytes,UA: NEGATIVE
Nitrite, UA: NEGATIVE
Specific Gravity, UA: >=1.03 — AB (ref 1.005–1.030)
Urobilinogen, Ur: 0.2 mg/dL (ref 0.2–1.0)
pH, UA: 7 (ref 5.0–7.5)

## 2022-08-15 LAB — CELIAC DISEASE AB SCREEN W/RFX
Antigliadin Abs, IgA: 4 units (ref 0–19)
IgA/Immunoglobulin A, Serum: 157 mg/dL (ref 87–352)
Transglutaminase IgA: <2 U/mL (ref 0–3)

## 2022-08-15 NOTE — Progress Notes (Signed)
Blood in her urine- enquire if she was on period - if yes repeat in a week

## 2022-08-22 DIAGNOSIS — G43109 Migraine with aura, not intractable, without status migrainosus: Secondary | ICD-10-CM | POA: Diagnosis not present

## 2022-08-22 DIAGNOSIS — R110 Nausea: Secondary | ICD-10-CM | POA: Diagnosis not present

## 2022-08-22 DIAGNOSIS — H53149 Visual discomfort, unspecified: Secondary | ICD-10-CM | POA: Diagnosis not present

## 2022-08-24 ENCOUNTER — Telehealth: Payer: Self-pay

## 2022-08-24 DIAGNOSIS — E611 Iron deficiency: Secondary | ICD-10-CM

## 2022-08-24 NOTE — Telephone Encounter (Signed)
Patient was contacted and asked her if she was on her menstrual cycle and she stated that she was.

## 2022-08-24 NOTE — Telephone Encounter (Signed)
-----   Message from Wyline Mood, MD sent at 08/15/2022 10:01 AM EDT ----- Blood in her urine- enquire if she was on period - if yes repeat in a week

## 2022-08-27 NOTE — Telephone Encounter (Signed)
Ok repeat urine when off cycle

## 2022-08-29 NOTE — Telephone Encounter (Signed)
Called patient to let her know that Dr. Tobi Bastos would like to recheck her urine test and patient stated that she will be able to pass by to do the urine test again.

## 2022-08-31 DIAGNOSIS — E0500 Thyrotoxicosis with diffuse goiter without thyrotoxic crisis or storm: Secondary | ICD-10-CM | POA: Diagnosis not present

## 2022-09-05 ENCOUNTER — Ambulatory Visit
Admission: RE | Admit: 2022-09-05 | Discharge: 2022-09-05 | Disposition: A | Discharge status: Home / Self Care | Payer: BC Managed Care – PPO | Attending: Gastroenterology | Admitting: Gastroenterology

## 2022-09-05 ENCOUNTER — Other Ambulatory Visit: Payer: Self-pay

## 2022-09-05 ENCOUNTER — Encounter
Admission: RE | Disposition: A | Discharge status: Home / Self Care | Payer: Self-pay | Source: Home / Self Care | Attending: Gastroenterology

## 2022-09-05 ENCOUNTER — Ambulatory Visit: Payer: BC Managed Care – PPO | Admitting: Anesthesiology

## 2022-09-05 ENCOUNTER — Encounter: Payer: Self-pay | Admitting: Gastroenterology

## 2022-09-05 DIAGNOSIS — G43909 Migraine, unspecified, not intractable, without status migrainosus: Secondary | ICD-10-CM | POA: Insufficient documentation

## 2022-09-05 DIAGNOSIS — E611 Iron deficiency: Secondary | ICD-10-CM | POA: Diagnosis not present

## 2022-09-05 DIAGNOSIS — F32A Depression, unspecified: Secondary | ICD-10-CM | POA: Diagnosis not present

## 2022-09-05 DIAGNOSIS — Z9049 Acquired absence of other specified parts of digestive tract: Secondary | ICD-10-CM | POA: Insufficient documentation

## 2022-09-05 DIAGNOSIS — D509 Iron deficiency anemia, unspecified: Secondary | ICD-10-CM | POA: Insufficient documentation

## 2022-09-05 HISTORY — PX: COLONOSCOPY WITH PROPOFOL: SHX5780

## 2022-09-05 HISTORY — DX: Hypothyroidism, unspecified: E03.9

## 2022-09-05 HISTORY — DX: Iron deficiency anemia, unspecified: D50.9

## 2022-09-05 HISTORY — DX: Personal history of other specified conditions: Z87.898

## 2022-09-05 HISTORY — PX: ESOPHAGOGASTRODUODENOSCOPY (EGD) WITH PROPOFOL: SHX5813

## 2022-09-05 LAB — POCT PREGNANCY, URINE: Preg Test, Ur: NEGATIVE

## 2022-09-05 SURGERY — COLONOSCOPY WITH PROPOFOL
Anesthesia: General

## 2022-09-05 MED ORDER — PROPOFOL 10 MG/ML IV BOLUS
INTRAVENOUS | Status: AC
  Filled 2022-09-05: qty 20

## 2022-09-05 MED ORDER — DEXMEDETOMIDINE HCL IN NACL 200 MCG/50ML IV SOLN
INTRAVENOUS | Status: DC | PRN
  Administered 2022-09-05: 8 ug via INTRAVENOUS

## 2022-09-05 MED ORDER — SODIUM CHLORIDE 0.9 % IV SOLN: INTRAVENOUS | Status: DC

## 2022-09-05 MED ORDER — LORAZEPAM 2 MG/ML IJ SOLN
INTRAMUSCULAR | Status: AC
  Filled 2022-09-05: qty 1

## 2022-09-05 MED ORDER — PROPOFOL 500 MG/50ML IV EMUL
INTRAVENOUS | Status: DC | PRN
  Administered 2022-09-05: 130 ug/kg/min via INTRAVENOUS

## 2022-09-05 MED ORDER — LIDOCAINE HCL (PF) 2 % IJ SOLN
INTRAMUSCULAR | Status: AC
  Filled 2022-09-05: qty 5

## 2022-09-05 MED ORDER — LORAZEPAM 2 MG/ML IJ SOLN
1.0000 mg | Freq: Once | INTRAMUSCULAR | Status: AC
  Administered 2022-09-05: 1 mg via INTRAVENOUS

## 2022-09-05 MED ORDER — LIDOCAINE HCL (CARDIAC) PF 100 MG/5ML IV SOSY
PREFILLED_SYRINGE | INTRAVENOUS | Status: DC | PRN
  Administered 2022-09-05: 50 mg via INTRAVENOUS

## 2022-09-05 MED ORDER — PROPOFOL 10 MG/ML IV BOLUS
INTRAVENOUS | Status: DC | PRN
  Administered 2022-09-05: 80 mg via INTRAVENOUS
  Administered 2022-09-05: 20 mg via INTRAVENOUS

## 2022-09-05 NOTE — Anesthesia Postprocedure Evaluation (Signed)
Anesthesia Post Note  Patient: Danielle Maxwell  Procedure(s) Performed: COLONOSCOPY WITH PROPOFOL ESOPHAGOGASTRODUODENOSCOPY (EGD) WITH PROPOFOL  Patient location during evaluation: Endoscopy Anesthesia Type: General Level of consciousness: awake and alert Pain management: pain level controlled Vital Signs Assessment: post-procedure vital signs reviewed and stable Respiratory status: spontaneous breathing, nonlabored ventilation, respiratory function stable and patient connected to nasal cannula oxygen Cardiovascular status: blood pressure returned to baseline and stable Postop Assessment: no apparent nausea or vomiting Anesthetic complications: no   No notable events documented.   Last Vitals:  Vitals:   09/05/22 0930 09/05/22 0953  BP: 112/74 123/81  Pulse: 90 86  Resp: (!) 26 19  Temp:  (!) 36.1 C  SpO2: 98% 99%    Last Pain:  Vitals:   09/05/22 0953  TempSrc: Temporal  PainSc:                  Cleda Mccreedy Joselito Fieldhouse

## 2022-09-05 NOTE — Transfer of Care (Signed)
Immediate Anesthesia Transfer of Care Note  Patient: Danielle Maxwell  Procedure(s) Performed: COLONOSCOPY WITH PROPOFOL ESOPHAGOGASTRODUODENOSCOPY (EGD) WITH PROPOFOL  Patient Location: PACU and Endoscopy Unit  Anesthesia Type:General  Level of Consciousness: drowsy and patient cooperative  Airway & Oxygen Therapy: Patient Spontanous Breathing  Post-op Assessment: Report given to RN and Post -op Vital signs reviewed and stable  Post vital signs: Reviewed and stable  Last Vitals:  Vitals Value Taken Time  BP 97/50 09/05/22 0910  Temp    Pulse 92 09/05/22 0910  Resp 19 09/05/22 0910  SpO2 97 % 09/05/22 0910    Last Pain:  Vitals:   09/05/22 0824  TempSrc: Temporal  PainSc: 0-No pain         Complications: No notable events documented.

## 2022-09-05 NOTE — Op Note (Signed)
[facility] Gastroenterology Patient Name: Danielle Maxwell Procedure Date: 09/05/2022 8:47 AM MRN: [id number] Account #: [id number] Date of Birth: March 11, 1981 Admit Type: Outpatient Age: 42 Room: [facility] ENDO ROOM 2 Gender: Female Note Status: Finalized Instrument Name: Prentice Docker [id number] Procedure:             Colonoscopy Indications:           Iron deficiency anemia Providers:             Wyline Mood MD, MD Referring MD:          Doreene Nest (Referring MD) Medicines:             Monitored Anesthesia Care Complications:         No immediate complications. Procedure:             Pre-Anesthesia Assessment:                        - Prior to the procedure, a History and Physical was                         performed, and patient medications, allergies and                         sensitivities were reviewed. The patient's tolerance                         of previous anesthesia was reviewed.                        - The risks and benefits of the procedure and the                         sedation options and risks were discussed with the                         patient. All questions were answered and informed                         consent was obtained.                        - ASA Grade Assessment: II - A patient with mild                         systemic disease.                        After obtaining informed consent, the colonoscope was                         passed under direct vision. Throughout the procedure,                         the patient's blood pressure, pulse, and oxygen                         saturations were monitored continuously. The                         Colonoscope was introduced through the  anus and                         advanced to the the cecum, identified by the                         appendiceal orifice. The colonoscopy was performed                         with ease. The patient tolerated the procedure well.                          The quality of the bowel preparation was excellent.                         The ileocecal valve, appendiceal orifice, and rectum                         were photographed. Findings:      The entire examined colon appeared normal on direct and retroflexion       views. Impression:            - The entire examined colon is normal on direct and                         retroflexion views.                        - No specimens collected. Recommendation:        - Discharge patient to home (with escort).                        - Resume previous diet.                        - Continue present medications.                        - To visualize the small bowel, perform video capsule                         endoscopy in 2 weeks. Procedure Code(s):     --- Professional ---                        [id number], Colonoscopy, flexible; diagnostic, including                         collection of specimen(s) by brushing or washing, when                         performed (separate procedure) Diagnosis Code(s):     --- Professional ---                        D50.9, Iron deficiency anemia, unspecified CPT copyright 2022 American Medical Association. All rights reserved. The codes documented in this report are preliminary and upon coder review may  be revised to meet current compliance requirements. Wyline Mood, MD Wyline Mood MD, MD 09/05/2022 9:06:39 AM This report has been signed electronically. Number of Addenda: 0 Note Initiated On: 09/05/2022  8:47 AM Scope Withdrawal Time: 0 hours 6 minutes 3 seconds  Total Procedure Duration: 0 hours 8 minutes 35 seconds  Estimated Blood Loss:  Estimated blood loss: none.      [facility]

## 2022-09-05 NOTE — Op Note (Signed)
[facility] Gastroenterology Patient Name: Danielle Maxwell Procedure Date: 09/05/2022 8:48 AM MRN: [id number] Account #: [id number] Date of Birth: [birth year] Admit Type: Outpatient Age: 42 Room: [facility] ENDO ROOM 2 Gender: Female Note Status: Finalized Instrument Name: Laurette Schimke [id number] Procedure:             Upper GI endoscopy Indications:           Iron deficiency anemia Providers:             Wyline Mood MD, MD Referring MD:          Doreene Nest (Referring MD) Medicines:             Monitored Anesthesia Care Complications:         No immediate complications. Procedure:             Pre-Anesthesia Assessment:                        - Prior to the procedure, a History and Physical was                         performed, and patient medications, allergies and                         sensitivities were reviewed. The patient's tolerance                         of previous anesthesia was reviewed.                        - The risks and benefits of the procedure and the                         sedation options and risks were discussed with the                         patient. All questions were answered and informed                         consent was obtained.                        - ASA Grade Assessment: II - A patient with mild                         systemic disease.                        After obtaining informed consent, the endoscope was                         passed under direct vision. Throughout the procedure,                         the patient's blood pressure, pulse, and oxygen                         saturations were monitored continuously. The Endoscope                         was introduced  through the mouth, and advanced to the                         third part of duodenum. The upper GI endoscopy was                         accomplished with ease. The patient tolerated the                         procedure well. Findings:      The esophagus  was normal.      The stomach was normal.      The examined duodenum was normal. Impression:            - Normal esophagus.                        - Normal stomach.                        - Normal examined duodenum.                        - No specimens collected. Recommendation:        - Perform a colonoscopy today. Procedure Code(s):     --- Professional ---                        [phone], Esophagogastroduodenoscopy, flexible,                         transoral; diagnostic, including collection of                         specimen(s) by brushing or washing, when performed                         (separate procedure) Diagnosis Code(s):     --- Professional ---                        D50.9, Iron deficiency anemia, unspecified CPT copyright 2022 American Medical Association. All rights reserved. The codes documented in this report are preliminary and upon coder review may  be revised to meet current compliance requirements. Wyline Mood, MD Wyline Mood MD, MD 09/05/2022 8:55:32 AM This report has been signed electronically. Number of Addenda: 0 Note Initiated On: 09/05/2022 8:48 AM Estimated Blood Loss:  Estimated blood loss: none.      [facility]

## 2022-09-05 NOTE — Anesthesia Preprocedure Evaluation (Signed)
Anesthesia Evaluation  Patient identified by MRN, date of birth, ID band Patient awake    Reviewed: Allergy & Precautions, NPO status , Patient's Chart, lab work & pertinent test results  History of Anesthesia Complications Negative for: history of anesthetic complications  Airway Mallampati: III  TM Distance: >3 FB Neck ROM: full    Dental  (+) Chipped   Pulmonary neg pulmonary ROS, neg shortness of breath   Pulmonary exam normal        Cardiovascular Exercise Tolerance: Good (-) angina negative cardio ROS Normal cardiovascular exam     Neuro/Psych  Headaches PSYCHIATRIC DISORDERS         GI/Hepatic Neg liver ROS,GERD  Controlled,,  Endo/Other  Hypothyroidism    Renal/GU negative Renal ROS  negative genitourinary   Musculoskeletal   Abdominal   Peds  Hematology negative hematology ROS (+)   Anesthesia Other Findings Past Medical History: 04/17/2000: Abnormal Pap smear 04/17/2006: Depression No date: H/O chlamydia infection No date: H/O rubella 09/22/2002: H/O urinary frequency No date: History of varicella 04/17/2009: Hx: UTI (urinary tract infection) No date: Hypothyroidism No date: Migraine 06/12/2003: Monilial vaginitis 12/31/2011: NSVD (normal spontaneous vaginal delivery)     Comment:  2227 No date: Polyhydramnios in second trimester No date: Yeast infection  Past Surgical History: 07/17/2019: CHOLECYSTECTOMY No date: WISDOM TOOTH EXTRACTION     Reproductive/Obstetrics negative OB ROS                             Anesthesia Physical Anesthesia Plan  ASA: 2  Anesthesia Plan: General   Post-op Pain Management:    Induction: Intravenous  PONV Risk Score and Plan: Propofol infusion and TIVA  Airway Management Planned: Natural Airway and Nasal Cannula  Additional Equipment:   Intra-op Plan:   Post-operative Plan:   Informed Consent: I have reviewed the  patients History and Physical, chart, labs and discussed the procedure including the risks, benefits and alternatives for the proposed anesthesia with the patient or authorized representative who has indicated his/her understanding and acceptance.     Dental Advisory Given  Plan Discussed with: Anesthesiologist, CRNA and Surgeon  Anesthesia Plan Comments: (Patient consented for risks of anesthesia including but not limited to:  - adverse reactions to medications - risk of airway placement if required - damage to eyes, teeth, lips or other oral mucosa - nerve damage due to positioning  - sore throat or hoarseness - Damage to heart, brain, nerves, lungs, other parts of body or loss of life  Patient voiced understanding.)       Anesthesia Quick Evaluation

## 2022-09-05 NOTE — H&P (Signed)
Wyline Mood, MD [address], [place], [place], [address] [address], [place], [place], [address] Phone: [phone]  Fax: [phone]  Primary Care Physician:  Doreene Nest, NP   Pre-Procedure History & Physical: HPI:  Danielle Maxwell is a 42 y.o. female is here for an endoscopy and colonoscopy    Past Medical History:  Diagnosis Date   Abnormal Pap smear 04/17/2000   Depression 04/17/2006   H/O chlamydia infection    H/O rubella    H/O urinary frequency 09/22/2002   History of varicella    Hx: UTI (urinary tract infection) 04/17/2009   Migraine    Monilial vaginitis 06/12/2003   NSVD (normal spontaneous vaginal delivery) 12/31/2011   2227   Polyhydramnios in second trimester    Yeast infection     Past Surgical History:  Procedure Laterality Date   CHOLECYSTECTOMY  07/17/2019   WISDOM TOOTH EXTRACTION      Prior to Admission medications   Medication Sig Start Date End Date Taking? Authorizing Provider  cyanocobalamin (VITAMIN B12) 1000 MCG/ML injection Inject 1 mL (1,000 mcg total) into the muscle every 30 (thirty) days. 07/24/22   Creig Hines, MD  meloxicam (MOBIC) 15 MG tablet Take 1 tablet (15 mg total) by mouth daily. Patient not taking: Reported on 08/14/2022 06/27/22   Felecia Shelling, DPM  methimazole (TAPAZOLE) 5 MG tablet Take 5 mg by mouth daily. 07/22/22   [provider]  rizatriptan (MAXALT) 10 MG tablet Take 1/2 to 1 tablet by mouth at migraine onset. May repeat in 2 hours if needed. 08/16/21   Doreene Nest, NP  Syringe/Needle, Disp, (SYRINGE 3CC/25GX1") 25G X 1" 3 ML MISC 1 Syringe by Does not apply route every 30 (thirty) days. 07/24/22   Creig Hines, MD  venlafaxine (EFFEXOR) 37.5 MG tablet Take 37.5 mg by mouth daily.    [provider]  Burr [employer]ico 578-46 MCG/24HR transdermal patch Place 1 patch onto the skin every Sunday.  05/23/19   [provider]    Allergies as of 08/14/2022   (No Known  Allergies)    Family History  Problem Relation Age of Onset   Diabetes Father    Hypertension Maternal Aunt    Hypertension Maternal Grandmother    Hypertension Maternal Grandfather    Diabetes Maternal Grandfather    Prostate cancer Maternal Grandfather    Diabetes Paternal Grandmother    Anesthesia problems Neg Hx     Social History   Socioeconomic History   Marital status: Married    Spouse name: Not on file   Number of children: Not on file   Years of education: Not on file   Highest education level: Not on file  Occupational History   Not on file  Tobacco Use   Smoking status: Never   Smokeless tobacco: Never  Vaping Use   Vaping Use: Never used  Substance and Sexual Activity   Alcohol use: Not Currently   Drug use: No   Sexual activity: Yes    Partners: Male    Birth control/protection: None  Other Topics Concern   Not on file  Social History Narrative   Married.   2 children.   Works at [employer].   Enjoys reading.    Social Determinants of Health   Financial Resource Strain: Not on file  Food Insecurity: Not on file  Transportation Needs: No Transportation Needs (04/04/2022)   PRAPARE - Transportation    Lack  of Transportation (Medical): No    Lack of Transportation (Non-Medical): No  Physical Activity: Not on file  Stress: Not on file  Social Connections: Not on file  Intimate Partner Violence: Not on file    Review of Systems: See HPI, otherwise negative ROS  Physical Exam: There were no vitals taken for this visit. General:   Alert,  pleasant and cooperative in NAD Head:  Normocephalic and atraumatic. Neck:  Supple; no masses or thyromegaly. Lungs:  Clear throughout to auscultation, normal respiratory effort.    Heart:  +S1, +S2, Regular rate and rhythm, No edema. Abdomen:  Soft, nontender and nondistended. Normal bowel sounds, without guarding, and without rebound.   Neurologic:  Alert and  oriented x4;  grossly normal  neurologically.  Impression/Plan: Danielle Maxwell is here for an endoscopy and colonoscopy  to be performed for  evaluation of iron deficiency anemia    Risks, benefits, limitations, and alternatives regarding endoscopy have been reviewed with the patient.  Questions have been answered.  All parties agreeable.   Wyline Mood, MD  09/05/2022, 8:02 AM

## 2022-09-06 ENCOUNTER — Encounter: Payer: Self-pay | Admitting: Gastroenterology

## 2022-09-21 DIAGNOSIS — R5383 Other fatigue: Secondary | ICD-10-CM | POA: Diagnosis not present

## 2022-09-21 DIAGNOSIS — E0590 Thyrotoxicosis, unspecified without thyrotoxic crisis or storm: Secondary | ICD-10-CM | POA: Diagnosis not present

## 2022-09-21 DIAGNOSIS — E032 Hypothyroidism due to medicaments and other exogenous substances: Secondary | ICD-10-CM | POA: Diagnosis not present

## 2022-09-21 DIAGNOSIS — E0500 Thyrotoxicosis with diffuse goiter without thyrotoxic crisis or storm: Secondary | ICD-10-CM | POA: Diagnosis not present

## 2022-10-23 ENCOUNTER — Inpatient Hospital Stay: Payer: BC Managed Care – PPO | Attending: Oncology

## 2022-10-23 DIAGNOSIS — D509 Iron deficiency anemia, unspecified: Secondary | ICD-10-CM | POA: Diagnosis not present

## 2022-10-23 DIAGNOSIS — E538 Deficiency of other specified B group vitamins: Secondary | ICD-10-CM | POA: Insufficient documentation

## 2022-10-23 LAB — CBC
HCT: 41.6 % (ref 36.0–46.0)
Hemoglobin: 13.3 g/dL (ref 12.0–15.0)
MCH: 27.9 pg (ref 26.0–34.0)
MCHC: 32 g/dL (ref 30.0–36.0)
MCV: 87.2 fL (ref 80.0–100.0)
Platelets: 307 10*3/uL (ref 150–400)
RBC: 4.77 MIL/uL (ref 3.87–5.11)
RDW: 15.1 % (ref 11.5–15.5)
WBC: 7 10*3/uL (ref 4.0–10.5)
nRBC: 0 % (ref 0.0–0.2)

## 2022-10-23 LAB — IRON AND TIBC
Iron: 57 ug/dL (ref 28–170)
Saturation Ratios: 12 % (ref 10.4–31.8)
TIBC: 472 ug/dL — ABNORMAL HIGH (ref 250–450)
UIBC: 415 ug/dL

## 2022-10-23 LAB — VITAMIN B12: Vitamin B-12: 188 pg/mL (ref 180–914)

## 2022-10-23 LAB — FERRITIN: Ferritin: 14 ng/mL (ref 11–307)

## 2022-10-24 DIAGNOSIS — E0590 Thyrotoxicosis, unspecified without thyrotoxic crisis or storm: Secondary | ICD-10-CM | POA: Diagnosis not present

## 2022-10-24 DIAGNOSIS — R898 Other abnormal findings in specimens from other organs, systems and tissues: Secondary | ICD-10-CM | POA: Diagnosis not present

## 2022-10-31 DIAGNOSIS — G43109 Migraine with aura, not intractable, without status migrainosus: Secondary | ICD-10-CM | POA: Diagnosis not present

## 2022-10-31 DIAGNOSIS — E0500 Thyrotoxicosis with diffuse goiter without thyrotoxic crisis or storm: Secondary | ICD-10-CM | POA: Diagnosis not present

## 2022-10-31 DIAGNOSIS — R5383 Other fatigue: Secondary | ICD-10-CM | POA: Diagnosis not present

## 2022-10-31 DIAGNOSIS — E663 Overweight: Secondary | ICD-10-CM | POA: Diagnosis not present

## 2022-11-22 DIAGNOSIS — N8110 Cystocele, unspecified: Secondary | ICD-10-CM | POA: Diagnosis not present

## 2022-11-22 DIAGNOSIS — G43109 Migraine with aura, not intractable, without status migrainosus: Secondary | ICD-10-CM | POA: Diagnosis not present

## 2022-11-22 DIAGNOSIS — F40298 Other specified phobia: Secondary | ICD-10-CM | POA: Diagnosis not present

## 2022-11-22 DIAGNOSIS — H53149 Visual discomfort, unspecified: Secondary | ICD-10-CM | POA: Diagnosis not present

## 2022-11-22 DIAGNOSIS — R110 Nausea: Secondary | ICD-10-CM | POA: Diagnosis not present

## 2022-12-11 ENCOUNTER — Encounter: Payer: Self-pay | Admitting: Gastroenterology

## 2022-12-11 ENCOUNTER — Ambulatory Visit: Payer: BC Managed Care – PPO | Admitting: Gastroenterology

## 2022-12-11 VITALS — BP 139/87 | HR 89 | Temp 98.3°F | Ht 63.0 in | Wt 178.1 lb

## 2022-12-11 DIAGNOSIS — E538 Deficiency of other specified B group vitamins: Secondary | ICD-10-CM

## 2022-12-11 DIAGNOSIS — E611 Iron deficiency: Secondary | ICD-10-CM

## 2022-12-11 DIAGNOSIS — R634 Abnormal weight loss: Secondary | ICD-10-CM | POA: Diagnosis not present

## 2022-12-11 DIAGNOSIS — D509 Iron deficiency anemia, unspecified: Secondary | ICD-10-CM

## 2022-12-11 NOTE — Progress Notes (Signed)
Wyline Mood MD, MRCP(U.K) [address]  [address]  [place], [place] [address]  Main: [phone]  Fax: [phone]   Primary Care Physician: Doreene Nest, NP  Primary Gastroenterologist:  Dr. Wyline Mood   Chief Complaint  Patient presents with   Follow-up    HPI: Danielle Maxwell is a 42 y.o. female   Summary of history :   Initially referred and seen back in 07/2022 for iron deficiency anemia and b12 deficiency , 20 lbs unintentional weight loss over 4 months.  Denies any change in bowel habits, denies any change of appetite . Follows with Dr Smith Robert in Oncology .  No overt blood loss. H/o use of Mobic.   Interval history  07/2022-12/11/2022  08/14/2022: Celiac -negtaive , blood seen in urine  B12 188 10/23/2022 hemoglobin 13.3 g ferritin 14 09/05/2022 colonoscopy normal, EGD normal.  She is doing well no complaints.  She is on birth control does not have heavy.'s. Current Outpatient Medications  Medication Sig Dispense Refill   cyanocobalamin (VITAMIN B12) 1000 MCG/ML injection Inject 1 mL (1,000 mcg total) into the muscle every 30 (thirty) days. 1 mL 11   ferrous sulfate 325 (65 FE) MG tablet Take 325 mg by mouth daily with breakfast.     methimazole (TAPAZOLE) 5 MG tablet Take 5 mg by mouth daily.     rizatriptan (MAXALT) 10 MG tablet Take 1/2 to 1 tablet by mouth at migraine onset. May repeat in 2 hours if needed. 10 tablet 0   Syringe/Needle, Disp, (SYRINGE 3CC/25GX1") 25G X 1" 3 ML MISC 1 Syringe by Does not apply route every 30 (thirty) days. 12 each 0   venlafaxine (EFFEXOR) 37.5 MG tablet Take 37.5 mg by mouth daily.     XULANE 150-35 MCG/24HR transdermal patch Place 1 patch onto the skin every Sunday.      meloxicam (MOBIC) 15 MG tablet Take 1 tablet (15 mg total) by mouth daily. (Patient not taking: Reported on 08/14/2022) 30 tablet 1   No current facility-administered medications for this visit.    Allergies as of 12/11/2022   (No Known Allergies)        ROS:  General: Negative for anorexia, weight loss, fever, chills, fatigue, weakness. ENT: Negative for hoarseness, difficulty swallowing , nasal congestion. CV: Negative for chest pain, angina, palpitations, dyspnea on exertion, peripheral edema.  Respiratory: Negative for dyspnea at rest, dyspnea on exertion, cough, sputum, wheezing.  GI: See history of present illness. GU:  Negative for dysuria, hematuria, urinary incontinence, urinary frequency, nocturnal urination.  Endo: Negative for unusual weight change.    Physical Examination:   BP 139/87   Pulse 89   Temp 98.3 F (36.8 C) (Oral)   Ht 5\' 3"  (1.6 m)   Wt 178 lb 2 oz (80.8 kg)   BMI 31.55 kg/m   General: Well-nourished, well-developed in no acute distress.  Eyes: No icterus. Conjunctivae pink. Neuro: Alert and oriented x 3.  Grossly intact. Skin: Warm and dry, no jaundice.   Psych: Alert and cooperative, normal mood and affect.   Imaging Studies: No results found.  Assessment and Plan:   Danielle Maxwell is a 42 y.o. y/o female for iron deficiency anemia, B12 deficiency. 20 lbs weight loss.  EGD colonoscopy showed no abnormalities   Plan  Recheck Urine analysis recheck to ensure no blood in the urine Follow up with Dr Smith Robert for iron deficiency as well as weight loss since last visit has gained 11 pounds.  Since hemoglobin is stable suggest to monitor closely if has nonresponse to iron then will need capsule study of the small bowel .     Dr Wyline Mood  MD,MRCP [facility]) Follow up in as needed

## 2022-12-12 LAB — URINALYSIS
Bilirubin, UA: NEGATIVE
Glucose, UA: NEGATIVE
Ketones, UA: NEGATIVE
Leukocytes,UA: NEGATIVE
Nitrite, UA: NEGATIVE
Protein,UA: NEGATIVE
RBC, UA: NEGATIVE
Specific Gravity, UA: 1.007 (ref 1.005–1.030)
Urobilinogen, Ur: 0.2 mg/dL (ref 0.2–1.0)
pH, UA: 7 (ref 5.0–7.5)

## 2022-12-26 DIAGNOSIS — E0500 Thyrotoxicosis with diffuse goiter without thyrotoxic crisis or storm: Secondary | ICD-10-CM | POA: Diagnosis not present

## 2022-12-26 DIAGNOSIS — E0590 Thyrotoxicosis, unspecified without thyrotoxic crisis or storm: Secondary | ICD-10-CM | POA: Diagnosis not present

## 2023-01-23 ENCOUNTER — Encounter: Payer: Self-pay | Admitting: Oncology

## 2023-01-23 ENCOUNTER — Inpatient Hospital Stay: Payer: BC Managed Care – PPO | Admitting: Oncology

## 2023-01-23 ENCOUNTER — Inpatient Hospital Stay: Payer: BC Managed Care – PPO | Attending: Oncology

## 2023-01-23 VITALS — BP 122/96 | HR 91 | Temp 97.2°F | Resp 18 | Ht 63.0 in | Wt 180.5 lb

## 2023-01-23 DIAGNOSIS — E538 Deficiency of other specified B group vitamins: Secondary | ICD-10-CM

## 2023-01-23 DIAGNOSIS — D509 Iron deficiency anemia, unspecified: Secondary | ICD-10-CM | POA: Diagnosis not present

## 2023-01-23 DIAGNOSIS — Z8042 Family history of malignant neoplasm of prostate: Secondary | ICD-10-CM | POA: Insufficient documentation

## 2023-01-23 DIAGNOSIS — Z8639 Personal history of other endocrine, nutritional and metabolic disease: Secondary | ICD-10-CM | POA: Diagnosis not present

## 2023-01-23 DIAGNOSIS — D519 Vitamin B12 deficiency anemia, unspecified: Secondary | ICD-10-CM

## 2023-01-23 LAB — CBC
HCT: 40.8 % (ref 36.0–46.0)
Hemoglobin: 13.1 g/dL (ref 12.0–15.0)
MCH: 28.5 pg (ref 26.0–34.0)
MCHC: 32.1 g/dL (ref 30.0–36.0)
MCV: 88.7 fL (ref 80.0–100.0)
Platelets: 324 10*3/uL (ref 150–400)
RBC: 4.6 MIL/uL (ref 3.87–5.11)
RDW: 12.8 % (ref 11.5–15.5)
WBC: 7.6 10*3/uL (ref 4.0–10.5)
nRBC: 0 % (ref 0.0–0.2)

## 2023-01-23 LAB — IRON AND TIBC
Iron: 53 ug/dL (ref 28–170)
Saturation Ratios: 11 % (ref 10.4–31.8)
TIBC: 487 ug/dL — ABNORMAL HIGH (ref 250–450)
UIBC: 434 ug/dL

## 2023-01-23 LAB — VITAMIN B12: Vitamin B-12: 380 pg/mL (ref 180–914)

## 2023-01-23 LAB — FERRITIN: Ferritin: 18 ng/mL (ref 11–307)

## 2023-01-24 NOTE — Progress Notes (Signed)
Hematology/Oncology Consult note [facility]  [phone] [phone]  Patient Care Team: Doreene Nest, NP as PCP - General (Internal Medicine) Jaymes Graff, MD as Consulting Physician (Obstetrics and Gynecology) Talmage Coin, MD as Consulting Physician (Endocrinology) Creig Hines, MD as Consulting Physician (Oncology)   Name of the patient: Danielle Maxwell  [id number]  07-02-1980   Date of visit: 01/24/23  Diagnosis-iron deficiency anemia  Chief complaint/ Reason for visit-routine follow-up of iron deficiency anemia  Heme/Onc history: Patient is a 42 year old female with history of iron and B12 deficiency anemia.  Presently patient is on B12 injections at home.  She has never received any IV iron.  Her iron levels are chronically low but she is on oral iron without any significant anemia. She has seen GI in the past and has undergone EGD and colonoscopy in May 2024 which did not show any evidence of bleeding or malignancy.  She is perimenopausal and menstrual cycles are not heavy.  Interval history-she has some mild fatigue but is otherwise doing well.  Menstrual cycles are presently regular.  She is tolerating oral iron well without any significant side effects  ECOG PS- 0 Pain scale- 0   Review of systems- Review of Systems  Constitutional:  Negative for chills, fever, malaise/fatigue and weight loss.  HENT:  Negative for congestion, ear discharge and nosebleeds.   Eyes:  Negative for blurred vision.  Respiratory:  Negative for cough, hemoptysis, sputum production, shortness of breath and wheezing.   Cardiovascular:  Negative for chest pain, palpitations, orthopnea and claudication.  Gastrointestinal:  Negative for abdominal pain, blood in stool, constipation, diarrhea, heartburn, melena, nausea and vomiting.  Genitourinary:  Negative for dysuria, flank pain, frequency, hematuria and urgency.  Musculoskeletal:  Negative for  back pain, joint pain and myalgias.  Skin:  Negative for rash.  Neurological:  Negative for dizziness, tingling, focal weakness, seizures, weakness and headaches.  Endo/Heme/Allergies:  Does not bruise/bleed easily.  Psychiatric/Behavioral:  Negative for depression and suicidal ideas. The patient does not have insomnia.       No Known Allergies   Past Medical History:  Diagnosis Date   Abnormal Pap smear 04/17/2000   Depression 04/17/2006   H/O chlamydia infection    H/O rubella    H/O urinary frequency 09/22/2002   History of emergence delirium    History of varicella    Hx: UTI (urinary tract infection) 04/17/2009   Hypothyroidism    IDA (iron deficiency anemia)    Migraine    Monilial vaginitis 06/12/2003   NSVD (normal spontaneous vaginal delivery) 12/31/2011   2227   Polyhydramnios in second trimester    Yeast infection      Past Surgical History:  Procedure Laterality Date   CHOLECYSTECTOMY  07/17/2019   COLONOSCOPY WITH PROPOFOL N/A 09/05/2022   Procedure: COLONOSCOPY WITH PROPOFOL;  Surgeon: Wyline Mood, MD;  Location: [facility] ENDOSCOPY;  Service: Gastroenterology;  Laterality: N/A;   ESOPHAGOGASTRODUODENOSCOPY (EGD) WITH PROPOFOL N/A 09/05/2022   Procedure: ESOPHAGOGASTRODUODENOSCOPY (EGD) WITH PROPOFOL;  Surgeon: Wyline Mood, MD;  Location: [facility] ENDOSCOPY;  Service: Gastroenterology;  Laterality: N/A;   WISDOM TOOTH EXTRACTION      Social History   Socioeconomic History   Marital status: Married    Spouse name: Not on file   Number of children: Not on file   Years of education: Not on file   Highest education level: Not on file  Occupational History   Not on file  Tobacco Use  Smoking status: Never   Smokeless tobacco: Never  Vaping Use   Vaping status: Never Used  Substance and Sexual Activity   Alcohol use: Yes    Comment: rarely   Drug use: No   Sexual activity: Yes    Partners: Male    Birth control/protection: None  Other Topics Concern    Not on file  Social History Narrative   Married.   2 children.   Works at [employer].   Enjoys reading.    Social Determinants of Health   Financial Resource Strain: Not on file  Food Insecurity: Not on file  Transportation Needs: No Transportation Needs (04/04/2022)   PRAPARE - Administrator, Civil Service (Medical): No    Lack of Transportation (Non-Medical): No  Physical Activity: Not on file  Stress: Not on file  Social Connections: Unknown (08/22/2021)   Received from [facility]   Social Network    Social Network: Not on file  Intimate Partner Violence: Unknown (08/22/2021)   Received from [facility]   HITS    Physically Hurt: Not on file    Insult or Talk Down To: Not on file    Threaten Physical Harm: Not on file    Scream or Curse: Not on file    Family History  Problem Relation Age of Onset   Diabetes Father    Hypertension Maternal Aunt    Hypertension Maternal Grandmother    Hypertension Maternal Grandfather    Diabetes Maternal Grandfather    Prostate cancer Maternal Grandfather    Diabetes Paternal Grandmother    Anesthesia problems Neg Hx      Current Outpatient Medications:    cyanocobalamin (VITAMIN B12) 1000 MCG/ML injection, Inject 1 mL (1,000 mcg total) into the muscle every 30 (thirty) days., Disp: 1 mL, Rfl: 11   eletriptan (RELPAX) 40 MG tablet, Take by mouth., Disp: , Rfl:    ferrous sulfate 325 (65 FE) MG tablet, Take 325 mg by mouth daily with breakfast., Disp: , Rfl:    rizatriptan (MAXALT) 10 MG tablet, Take 1/2 to 1 tablet by mouth at migraine onset. May repeat in 2 hours if needed., Disp: 10 tablet, Rfl: 0   Syringe/Needle, Disp, (SYRINGE 3CC/25GX1") 25G X 1" 3 ML MISC, 1 Syringe by Does not apply route every 30 (thirty) days., Disp: 12 each, Rfl: 0   venlafaxine (EFFEXOR) 37.5 MG tablet, Take 37.5 mg by mouth daily., Disp: , Rfl:    XULANE 150-35 MCG/24HR transdermal patch, Place 1 patch onto the  skin every Sunday. , Disp: , Rfl:    meloxicam (MOBIC) 15 MG tablet, Take 1 tablet (15 mg total) by mouth daily. (Patient not taking: Reported on 01/23/2023), Disp: 30 tablet, Rfl: 1   methimazole (TAPAZOLE) 5 MG tablet, Take 5 mg by mouth daily. (Patient not taking: Reported on 01/23/2023), Disp: , Rfl:   Physical exam:  Vitals:   01/23/23 1520  BP: (!) 122/96  Pulse: 91  Resp: 18  Temp: (!) 97.2 F (36.2 C)  TempSrc: Tympanic  SpO2: 100%  Weight: 180 lb 8 oz (81.9 kg)  Height: 5\' 3"  (1.6 m)   Physical Exam Cardiovascular:     Rate and Rhythm: Normal rate and regular rhythm.     Heart sounds: Normal heart sounds.  Pulmonary:     Effort: Pulmonary effort is normal.  Skin:    General: Skin is warm and dry.  Neurological:     Mental Status: She is  alert and oriented to person, place, and time.         Latest Ref Rng & Units 07/04/2022   12:17 PM  CMP  Glucose 70 - 99 mg/dL 161   BUN 6 - 20 mg/dL 8   Creatinine 0.96 - 0.45 mg/dL 4.09   Sodium 811 - 914 mmol/L 137   Potassium 3.5 - 5.1 mmol/L 3.7   Chloride 98 - 111 mmol/L 104   CO2 22 - 32 mmol/L 22   Calcium 8.9 - 10.3 mg/dL 9.1   Total Protein 6.5 - 8.1 g/dL 8.7   Total Bilirubin 0.3 - 1.2 mg/dL 0.8   Alkaline Phos 38 - 126 U/L 49   AST 15 - 41 U/L 22   ALT 0 - 44 U/L 13       Latest Ref Rng & Units 01/23/2023    2:56 PM  CBC  WBC 4.0 - 10.5 K/uL 7.6   Hemoglobin 12.0 - 15.0 g/dL 78.2   Hematocrit 95.6 - 46.0 % 40.8   Platelets 150 - 400 K/uL 324      Assessment and plan- Patient is a 42 y.o. female here for routine follow-up of iron and B12 deficiency anemia  Patient is not presently anemic with an H&H of 13.1/40 point8.  Iron levels are still suggestive of iron deficiency with a low ferritin of 18 and an iron saturation of 11% with elevated TIBC.  Patient does not wish to receive IV iron at this time and will continue with oral iron.  GI workup so far has been negative.  Given the stability of her counts I  will repeat CBC ferritin and iron studies in 6 months in 1 year and see her back in 1 year.  History of B12 deficiency: She will continue home monthly B12 injections   Visit Diagnosis 1. Iron deficiency anemia, unspecified iron deficiency anemia type      Dr. Owens Shark, MD, MPH [facility] at [facility] [phone] 01/24/2023 8:34 AM

## 2023-02-16 ENCOUNTER — Encounter: Payer: Self-pay | Admitting: Emergency Medicine

## 2023-02-16 ENCOUNTER — Other Ambulatory Visit: Payer: Self-pay

## 2023-02-16 ENCOUNTER — Emergency Department
Admission: EM | Admit: 2023-02-16 | Discharge: 2023-02-16 | Disposition: A | Discharge status: Home / Self Care | Payer: BC Managed Care – PPO | Attending: Emergency Medicine | Admitting: Emergency Medicine

## 2023-02-16 DIAGNOSIS — I10 Essential (primary) hypertension: Secondary | ICD-10-CM | POA: Insufficient documentation

## 2023-02-16 DIAGNOSIS — R519 Headache, unspecified: Secondary | ICD-10-CM | POA: Diagnosis not present

## 2023-02-16 LAB — URINALYSIS, ROUTINE W REFLEX MICROSCOPIC
Bilirubin Urine: NEGATIVE
Glucose, UA: NEGATIVE mg/dL
Ketones, ur: NEGATIVE mg/dL
Leukocytes,Ua: NEGATIVE
Nitrite: NEGATIVE
Protein, ur: NEGATIVE mg/dL
Specific Gravity, Urine: 1.011 (ref 1.005–1.030)
pH: 6 (ref 5.0–8.0)

## 2023-02-16 LAB — BASIC METABOLIC PANEL
Anion gap: 11 (ref 5–15)
BUN: <5 mg/dL — ABNORMAL LOW (ref 6–20)
CO2: 24 mmol/L (ref 22–32)
Calcium: 9 mg/dL (ref 8.9–10.3)
Chloride: 103 mmol/L (ref 98–111)
Creatinine, Ser: 0.79 mg/dL (ref 0.44–1.00)
GFR, Estimated: >60 mL/min (ref >60)
Glucose, Bld: 119 mg/dL — ABNORMAL HIGH (ref 70–99)
Potassium: 3.1 mmol/L — ABNORMAL LOW (ref 3.5–5.1)
Sodium: 138 mmol/L (ref 135–145)

## 2023-02-16 LAB — CBC
HCT: 43.4 % (ref 36.0–46.0)
Hemoglobin: 14 g/dL (ref 12.0–15.0)
MCH: 28.7 pg (ref 26.0–34.0)
MCHC: 32.3 g/dL (ref 30.0–36.0)
MCV: 89.1 fL (ref 80.0–100.0)
Platelets: 312 10*3/uL (ref 150–400)
RBC: 4.87 MIL/uL (ref 3.87–5.11)
RDW: 13 % (ref 11.5–15.5)
WBC: 6.7 10*3/uL (ref 4.0–10.5)
nRBC: 0 % (ref 0.0–0.2)

## 2023-02-16 LAB — POC URINE PREG, ED: Preg Test, Ur: NEGATIVE

## 2023-02-16 MED ORDER — AMLODIPINE BESYLATE 5 MG PO TABS: 5.0000 mg | ORAL_TABLET | Freq: Every day | ORAL | 0 refills | Status: DC

## 2023-02-16 MED ORDER — CLONIDINE HCL 0.1 MG PO TABS
0.2000 mg | ORAL_TABLET | Freq: Once | ORAL | Status: DC
  Filled 2023-02-16: qty 2

## 2023-02-16 MED ORDER — AMLODIPINE BESYLATE 5 MG PO TABS
5.0000 mg | ORAL_TABLET | Freq: Once | ORAL | Status: AC
  Administered 2023-02-16: 5 mg via ORAL
  Filled 2023-02-16: qty 1

## 2023-02-16 NOTE — ED Notes (Signed)
See triage note  Presents with slight headache and hypertension  States she is not on anyhting for her b/p

## 2023-02-16 NOTE — Discharge Instructions (Signed)

## 2023-02-16 NOTE — ED Provider Notes (Signed)
[facility] Provider Note    Event Date/Time   First MD Initiated Contact with Patient 02/16/23 [id number]     (approximate)   History   Chief Complaint: Hypertension   HPI  Danielle Maxwell is a 42 y.o. female with past history of migraines who comes ED complaining of elevated blood pressure for the past few days, gradually increasing.  This morning associated with feeling of dizziness and a mild headache.  No thunderclap or sudden onset headache.  No vision changes, no paresthesias or motor weakness, no change in balance or coordination or falls or trauma.  No fevers or neck pain     Physical Exam   Triage Vital Signs: ED Triage Vitals  Encounter Vitals Group     BP 02/16/23 0832 (!) 180/103     Systolic BP Percentile --      Diastolic BP Percentile --      Pulse Rate 02/16/23 0832 96     Resp 02/16/23 0832 18     Temp 02/16/23 0832 98.1 F (36.7 C)     Temp src --      SpO2 02/16/23 0832 99 %     Weight 02/16/23 0844 180 lb 8.9 oz (81.9 kg)     Height 02/16/23 0844 5\' 3"  (1.6 m)     Head Circumference --      Peak Flow --      Pain Score 02/16/23 0829 0     Pain Loc --      Pain Education --      Exclude from Growth Chart --     Most recent vital signs: Vitals:   02/16/23 1000 02/16/23 1003  BP: (!) 135/98 (!) 135/98  Pulse: 88   Resp:    Temp:    SpO2: 98%     General: Awake, no distress.  CV:  Good peripheral perfusion.  Regular rate and rhythm Resp:  Normal effort.  Clear to auscultation bilaterally Abd:  No distention.  Soft nontender Other:  Cranial nerves III through XII intact.   ED Results / Procedures / Treatments   Labs (all labs ordered are listed, but only abnormal results are displayed) Labs Reviewed  BASIC METABOLIC PANEL - Abnormal; Notable for the following components:      Result Value   Potassium 3.1 (*)    Glucose, Bld 119 (*)    BUN <5 (*)    All other components within normal limits  URINALYSIS, ROUTINE W  REFLEX MICROSCOPIC - Abnormal; Notable for the following components:   Color, Urine YELLOW (*)    APPearance CLEAR (*)    Hgb urine dipstick MODERATE (*)    Bacteria, UA RARE (*)    All other components within normal limits  CBC  POC URINE PREG, ED     EKG    RADIOLOGY    PROCEDURES:  Procedures   MEDICATIONS ORDERED IN ED: Medications  amLODipine (NORVASC) tablet 5 mg (5 mg Oral Given 02/16/23 1003)     IMPRESSION / MDM / ASSESSMENT AND PLAN / ED COURSE  I reviewed the triage vital signs and the nursing notes.  DDx: Electrolyte abnormality, AKI, anemia, pregnancy, uncontrolled hypertension  Patient's presentation is most consistent with acute presentation with potential threat to life or bodily function.  Patient presents with uncontrolled, symptomatic hypertension.  No worrisome symptoms.  Exam is benign and reassuring.  Labs unremarkable.  Blood pressure improved on its own in the ED, patient started on Norvasc, has  an appointment with her PCP in a few weeks, she will keep this appointment to recheck blood pressure.       FINAL CLINICAL IMPRESSION(S) / ED DIAGNOSES   Final diagnoses:  Uncontrolled hypertension     Rx / DC Orders   ED Discharge Orders          Ordered    amLODipine (NORVASC) 5 MG tablet  Daily        02/16/23 0933             Note:  This document was prepared using Dragon voice recognition software and may include unintentional dictation errors.   Sharman Cheek, MD 02/16/23 1026

## 2023-02-16 NOTE — ED Triage Notes (Addendum)
Pt complains of high blood pressure for past several days and today started to have headache and nausea. Not currently on any BP medication. Denies any pain at this current time.

## 2023-02-21 ENCOUNTER — Ambulatory Visit: Payer: BC Managed Care – PPO | Admitting: Primary Care

## 2023-02-21 VITALS — BP 142/78 | HR 110 | Temp 97.9°F | Ht 63.0 in | Wt 181.0 lb

## 2023-02-21 DIAGNOSIS — E611 Iron deficiency: Secondary | ICD-10-CM

## 2023-02-21 DIAGNOSIS — I10 Essential (primary) hypertension: Secondary | ICD-10-CM | POA: Diagnosis not present

## 2023-02-21 DIAGNOSIS — G43009 Migraine without aura, not intractable, without status migrainosus: Secondary | ICD-10-CM | POA: Diagnosis not present

## 2023-02-21 DIAGNOSIS — F4323 Adjustment disorder with mixed anxiety and depressed mood: Secondary | ICD-10-CM | POA: Diagnosis not present

## 2023-02-21 DIAGNOSIS — R7989 Other specified abnormal findings of blood chemistry: Secondary | ICD-10-CM

## 2023-02-21 DIAGNOSIS — R519 Headache, unspecified: Secondary | ICD-10-CM

## 2023-02-21 NOTE — Assessment & Plan Note (Signed)
Controlled.  Continue venlafaxine 37.5 mg daily for prevention and Ella triptan 40 mg as needed for abortive therapy. Following with neurology.

## 2023-02-21 NOTE — Assessment & Plan Note (Signed)
Following with endocrinology. Continue monitoring.

## 2023-02-21 NOTE — Patient Instructions (Signed)
Continue to monitor blood pressure at home.  Please send a blood pressure readings via MyChart in 1 to 2 weeks.  Continue amlodipine 5 mg daily for blood pressure.  Schedule a lab only appointment to have your cholesterol and blood sugar level tested.  It was a pleasure to see you today!

## 2023-02-21 NOTE — Assessment & Plan Note (Signed)
Significant improvement.  Continue venlafaxine 37.5 mg daily per neurology.

## 2023-02-21 NOTE — Assessment & Plan Note (Signed)
New diagnosis.  Continue amlodipine 5 mg daily for which she recently initiated. She was sent blood pressure readings via MyChart in 1 to 2 weeks.  Will check additional labs including A1c and lipid panel given family history of stroke/heart disease. She will schedule a lab only appointment for when she is fasting.

## 2023-02-21 NOTE — Assessment & Plan Note (Signed)
Following with hematology, office notes and labs reviewed from October 2024.  Continue ferrous sulfate 325 mg daily.

## 2023-02-21 NOTE — Assessment & Plan Note (Signed)
Controlled.  Continue venlafaxine 37.5 mg daily per neurology.

## 2023-02-21 NOTE — Progress Notes (Signed)
Subjective:    Patient ID: Danielle Maxwell, female    DOB: Aug 03, 1980, 42 y.o.   MRN: [id number]  HPI  Danielle Maxwell is a very pleasant 42 y.o. female with a history of migraines, chronic idiopathic constipation, IBS, iron deficiency anemia, sinus tachycardia, who presents today for emergency department follow-up and general follow up.  1) Hypertension: New diagnosis. Chronic, intermittent, elevated readings since 2021. She presented to [facility] ED on 02/16/2023 for symptoms of elevated blood pressure that had been increasing over the last several days.  Also with dizziness and a mild headache that day.  Blood pressure was noted to be above goal so she was treated with amlodipine 5 mg daily.  She was discharged home later that afternoon with recommendations for PCP follow-up.  Since her ED visit she is compliant to amlodipine 5 mg daily. She began noticing elevated BP readings over the last 2 months ranging 160-180/90. She denies a family history of hypertension, but does have a family history of stroke and heart disease in her father.  2) Iron Deficiency Anemia: Following with hematology. She is managed on ferrous sulfate 325 mg daily for iron deficiency anemia and vitamin B12 1000 mcg injections once monthly for deficiency. Previously managed on iron infusions, last evaluated in October 2024, labs were within normal range.   She underwent colonoscopy and endoscopy per GI which was negative for a source for the bleed.   BP Readings from Last 3 Encounters:  02/21/23 (!) 142/78  02/16/23 134/88  01/23/23 (!) 122/96    Wt Readings from Last 3 Encounters:  02/21/23 181 lb (82.1 kg)  02/16/23 180 lb 8.9 oz (81.9 kg)  01/23/23 180 lb 8 oz (81.9 kg)   3) Migraines: Chronic. Following with neurology.  Currently managed on venlafaxine 37.5 mg daily for prevention and eletriptan 40 mg as needed for abortive therapy.  She is doing well on this regimen, headaches are overall infrequent, migraines occur  twice monthly.  She is satisfied with her treatment.  Review of Systems  Eyes:  Negative for visual disturbance.  Respiratory:  Negative for shortness of breath.   Cardiovascular:  Negative for chest pain.  Neurological:  Negative for dizziness and headaches.  Psychiatric/Behavioral:  The patient is not nervous/anxious.          Past Medical History:  Diagnosis Date   Abnormal Pap smear 04/17/2000   Depression 04/17/2006   Epigastric pain 08/11/2022   H/O chlamydia infection    H/O rubella    H/O urinary frequency 09/22/2002   History of emergence delirium    History of varicella    Hx: UTI (urinary tract infection) 04/17/2009   Hypothyroidism    IDA (iron deficiency anemia)    Migraine    Monilial vaginitis 06/12/2003   NSVD (normal spontaneous vaginal delivery) 12/31/2011   2227   Polyhydramnios in second trimester    Sinus tachycardia 02/17/2022   Yeast infection     Social History   Socioeconomic History   Marital status: Married    Spouse name: Not on file   Number of children: Not on file   Years of education: Not on file   Highest education level: Bachelor's degree (e.g., BA, AB, BS)  Occupational History   Not on file  Tobacco Use   Smoking status: Never   Smokeless tobacco: Never  Vaping Use   Vaping status: Never Used  Substance and Sexual Activity   Alcohol use: Yes    Comment: rarely  Drug use: No   Sexual activity: Yes    Partners: Male    Birth control/protection: None  Other Topics Concern   Not on file  Social History Narrative   Married.   2 children.   Works at [employer].   Enjoys reading.    Social Determinants of Health   Financial Resource Strain: Low Risk  (02/21/2023)   Overall Financial Resource Strain (CARDIA)    Difficulty of Paying Living Expenses: Not hard at all  Food Insecurity: No Food Insecurity (02/21/2023)   Hunger Vital Sign    Worried About Running Out of Food in the Last Year: Never true    Ran Out of Food in the  Last Year: Never true  Transportation Needs: No Transportation Needs (02/21/2023)   PRAPARE - Administrator, Civil Service (Medical): No    Lack of Transportation (Non-Medical): No  Physical Activity: Insufficiently Active (02/21/2023)   Exercise Vital Sign    Days of Exercise per Week: 1 day    Minutes of Exercise per Session: 10 min  Stress: Stress Concern Present (02/21/2023)   [employer] of Occupational Health - Occupational Stress Questionnaire    Feeling of Stress : Very much  Social Connections: Moderately Integrated (02/21/2023)   Social Connection and Isolation Panel [NHANES]    Frequency of Communication with Friends and Family: More than three times a week    Frequency of Social Gatherings with Friends and Family: Once a week    Attends Religious Services: 1 to 4 times per year    Active Member of [employer] or Organizations: No    Attends Engineer, structural: Not on file    Marital Status: Married  Intimate Partner Violence: Unknown (08/22/2021)   Received from [facility], [facility]   HITS    Physically Hurt: Not on file    Insult or Talk Down To: Not on file    Threaten Physical Harm: Not on file    Scream or Curse: Not on file    Past Surgical History:  Procedure Laterality Date   CHOLECYSTECTOMY  07/17/2019   COLONOSCOPY WITH PROPOFOL N/A 09/05/2022   Procedure: COLONOSCOPY WITH PROPOFOL;  Surgeon: Wyline Mood, MD;  Location: [facility] ENDOSCOPY;  Service: Gastroenterology;  Laterality: N/A;   ESOPHAGOGASTRODUODENOSCOPY (EGD) WITH PROPOFOL N/A 09/05/2022   Procedure: ESOPHAGOGASTRODUODENOSCOPY (EGD) WITH PROPOFOL;  Surgeon: Wyline Mood, MD;  Location: [facility] ENDOSCOPY;  Service: Gastroenterology;  Laterality: N/A;   WISDOM TOOTH EXTRACTION      Family History  Problem Relation Age of Onset   Diabetes Father    Stroke Father    Heart disease Father    Hypertension Maternal Grandmother    Hypertension Maternal Grandfather    Diabetes Maternal  Grandfather    Prostate cancer Maternal Grandfather    Diabetes Paternal Grandmother    Hypertension Maternal Aunt    Anesthesia problems Neg Hx     No Known Allergies  Current Outpatient Medications on File Prior to Visit  Medication Sig Dispense Refill   amLODipine (NORVASC) 5 MG tablet Take 1 tablet (5 mg total) by mouth daily. 30 tablet 0   cyanocobalamin (VITAMIN B12) 1000 MCG/ML injection Inject 1 mL (1,000 mcg total) into the muscle every 30 (thirty) days. 1 mL 11   eletriptan (RELPAX) 40 MG tablet Take by mouth.     ferrous sulfate 325 (65 FE) MG tablet Take 325 mg by mouth daily with breakfast.     hydrOXYzine (ATARAX) 10 MG tablet Take  10 mg by mouth 3 (three) times daily as needed for anxiety.     Syringe/Needle, Disp, (SYRINGE 3CC/25GX1") 25G X 1" 3 ML MISC 1 Syringe by Does not apply route every 30 (thirty) days. 12 each 0   venlafaxine (EFFEXOR) 37.5 MG tablet Take 37.5 mg by mouth daily.     XULANE 150-35 MCG/24HR transdermal patch Place 1 patch onto the skin every Sunday.      meloxicam (MOBIC) 15 MG tablet Take 1 tablet (15 mg total) by mouth daily. (Patient not taking: Reported on 01/23/2023) 30 tablet 1   No current facility-administered medications on file prior to visit.    BP (!) 142/78   Pulse (!) 110   Temp 97.9 F (36.6 C) (Temporal)   Ht 5\' 3"  (1.6 m)   Wt 181 lb (82.1 kg)   LMP 02/19/2023 Comment: pt states has period every 2 months  SpO2 99%   BMI 32.06 kg/m  Objective:   Physical Exam Cardiovascular:     Rate and Rhythm: Normal rate and regular rhythm.  Pulmonary:     Effort: Pulmonary effort is normal.     Breath sounds: Normal breath sounds.  Musculoskeletal:     Cervical back: Neck supple.  Skin:    General: Skin is warm and dry.  Neurological:     Mental Status: She is alert and oriented to person, place, and time.  Psychiatric:        Mood and Affect: Mood normal.           Assessment & Plan:  Primary hypertension Assessment &  Plan: New diagnosis.  Continue amlodipine 5 mg daily for which she recently initiated. She was sent blood pressure readings via MyChart in 1 to 2 weeks.  Will check additional labs including A1c and lipid panel given family history of stroke/heart disease. She will schedule a lab only appointment for when she is fasting.  Orders: -     Lipid panel; Future -     Hemoglobin A1c; Future  Migraine without aura and without status migrainosus, not intractable Assessment & Plan: Controlled.  Continue venlafaxine 37.5 mg daily for prevention and Ella triptan 40 mg as needed for abortive therapy. Following with neurology.   Adjustment disorder with mixed anxiety and depressed mood Assessment & Plan: Controlled.  Continue venlafaxine 37.5 mg daily per neurology.   Frequent headaches Assessment & Plan: Significant improvement.  Continue venlafaxine 37.5 mg daily per neurology.   Iron deficiency Assessment & Plan: Following with hematology, office notes and labs reviewed from October 2024.  Continue ferrous sulfate 325 mg daily.   Low serum thyroid stimulating hormone (TSH) Assessment & Plan: Following with endocrinology. Continue monitoring.         Doreene Nest, NP

## 2023-03-09 DIAGNOSIS — G43109 Migraine with aura, not intractable, without status migrainosus: Secondary | ICD-10-CM | POA: Diagnosis not present

## 2023-03-09 DIAGNOSIS — D729 Disorder of white blood cells, unspecified: Secondary | ICD-10-CM | POA: Diagnosis not present

## 2023-03-09 DIAGNOSIS — E559 Vitamin D deficiency, unspecified: Secondary | ICD-10-CM | POA: Diagnosis not present

## 2023-03-09 DIAGNOSIS — E611 Iron deficiency: Secondary | ICD-10-CM | POA: Diagnosis not present

## 2023-03-09 DIAGNOSIS — E538 Deficiency of other specified B group vitamins: Secondary | ICD-10-CM | POA: Diagnosis not present

## 2023-03-09 DIAGNOSIS — E0500 Thyrotoxicosis with diffuse goiter without thyrotoxic crisis or storm: Secondary | ICD-10-CM | POA: Diagnosis not present

## 2023-03-09 DIAGNOSIS — R5383 Other fatigue: Secondary | ICD-10-CM | POA: Diagnosis not present

## 2023-03-09 DIAGNOSIS — L659 Nonscarring hair loss, unspecified: Secondary | ICD-10-CM | POA: Diagnosis not present

## 2023-03-09 DIAGNOSIS — K5900 Constipation, unspecified: Secondary | ICD-10-CM | POA: Diagnosis not present

## 2023-03-13 MED ORDER — AMLODIPINE BESYLATE 10 MG PO TABS
10.0000 mg | ORAL_TABLET | Freq: Every day | ORAL | 0 refills | Status: DC
Start: 2023-03-13 — End: 2023-08-16

## 2023-03-14 ENCOUNTER — Other Ambulatory Visit (INDEPENDENT_AMBULATORY_CARE_PROVIDER_SITE_OTHER): Payer: BC Managed Care – PPO

## 2023-03-14 DIAGNOSIS — I10 Essential (primary) hypertension: Secondary | ICD-10-CM | POA: Diagnosis not present

## 2023-03-14 LAB — LIPID PANEL
Cholesterol: 180 mg/dL (ref 0–200)
HDL: 56.8 mg/dL (ref >39.00)
LDL Cholesterol: 104 mg/dL — ABNORMAL HIGH (ref 0–99)
NonHDL: 123.32
Total CHOL/HDL Ratio: 3
Triglycerides: 99 mg/dL (ref 0.0–149.0)
VLDL: 19.8 mg/dL (ref 0.0–40.0)

## 2023-03-14 LAB — HEMOGLOBIN A1C: Hgb A1c MFr Bld: 5.8 % (ref 4.6–6.5)

## 2023-03-27 DIAGNOSIS — E663 Overweight: Secondary | ICD-10-CM | POA: Diagnosis not present

## 2023-03-27 DIAGNOSIS — R5383 Other fatigue: Secondary | ICD-10-CM | POA: Diagnosis not present

## 2023-03-27 DIAGNOSIS — G43109 Migraine with aura, not intractable, without status migrainosus: Secondary | ICD-10-CM | POA: Diagnosis not present

## 2023-03-27 DIAGNOSIS — E0590 Thyrotoxicosis, unspecified without thyrotoxic crisis or storm: Secondary | ICD-10-CM | POA: Diagnosis not present

## 2023-03-27 DIAGNOSIS — E0500 Thyrotoxicosis with diffuse goiter without thyrotoxic crisis or storm: Secondary | ICD-10-CM | POA: Diagnosis not present

## 2023-04-02 DIAGNOSIS — Z01419 Encounter for gynecological examination (general) (routine) without abnormal findings: Secondary | ICD-10-CM | POA: Diagnosis not present

## 2023-04-02 DIAGNOSIS — Z01411 Encounter for gynecological examination (general) (routine) with abnormal findings: Secondary | ICD-10-CM | POA: Diagnosis not present

## 2023-04-02 DIAGNOSIS — Z1231 Encounter for screening mammogram for malignant neoplasm of breast: Secondary | ICD-10-CM | POA: Diagnosis not present

## 2023-04-02 DIAGNOSIS — F419 Anxiety disorder, unspecified: Secondary | ICD-10-CM | POA: Diagnosis not present

## 2023-04-02 DIAGNOSIS — Z1331 Encounter for screening for depression: Secondary | ICD-10-CM | POA: Diagnosis not present

## 2023-04-02 LAB — HM MAMMOGRAPHY

## 2023-04-12 ENCOUNTER — Ambulatory Visit: Payer: BC Managed Care – PPO | Admitting: Family Medicine

## 2023-04-18 ENCOUNTER — Ambulatory Visit
Admission: RE | Admit: 2023-04-18 | Discharge: 2023-04-18 | Disposition: A | Discharge status: Home / Self Care | Payer: BC Managed Care – PPO | Source: Ambulatory Visit | Attending: Emergency Medicine | Admitting: Emergency Medicine

## 2023-04-18 VITALS — BP 125/81 | HR 99 | Temp 100.1°F | Resp 18

## 2023-04-18 DIAGNOSIS — J101 Influenza due to other identified influenza virus with other respiratory manifestations: Secondary | ICD-10-CM | POA: Diagnosis not present

## 2023-04-18 LAB — POC COVID19/FLU A&B COMBO
Covid Antigen, POC: NEGATIVE
Influenza A Antigen, POC: POSITIVE — AB
Influenza B Antigen, POC: NEGATIVE

## 2023-04-18 LAB — POCT RAPID STREP A (OFFICE): Rapid Strep A Screen: NEGATIVE

## 2023-04-18 MED ORDER — OSELTAMIVIR PHOSPHATE 75 MG PO CAPS: 75.0000 mg | ORAL_CAPSULE | Freq: Two times a day (BID) | ORAL | 0 refills | Status: DC

## 2023-04-18 NOTE — ED Provider Notes (Signed)
 Danielle Maxwell    CSN: [id number] Arrival date & time: 04/18/23  1414      History   Chief Complaint Chief Complaint  Patient presents with   Cough    Chills, sore throat and headaches - Entered by patient    HPI Danielle Maxwell is a 43 y.o. female.  Patient presents with chills, runny nose, congestion, cough since yesterday.  No fever, shortness of breath, vomiting, diarrhea.  Treating symptoms with DayQuil.  Patient's husband currently has the flu.    The history is provided by the patient and medical records.    Past Medical History:  Diagnosis Date   Abnormal Pap smear 04/17/2000   Depression 04/17/2006   Epigastric pain 08/11/2022   H/O chlamydia infection    H/O rubella    H/O urinary frequency 09/22/2002   History of emergence delirium    History of varicella    Hx: UTI (urinary tract infection) 04/17/2009   Hypothyroidism    IDA (iron  deficiency anemia)    Migraine    Monilial vaginitis 06/12/2003   NSVD (normal spontaneous vaginal delivery) 12/31/2011   2227   Polyhydramnios in second trimester    Sinus tachycardia 02/17/2022   Yeast infection     Patient Active Problem List   Diagnosis Date Noted   Primary hypertension 02/21/2023   Iron  deficiency 09/05/2022   Chronic idiopathic constipation 08/11/2022   Irritable bowel syndrome 08/11/2022   Low serum thyroid  stimulating hormone (TSH) 02/17/2022   Fatigue 07/27/2021   GERD (gastroesophageal reflux disease) 03/20/2019   Frequent headaches 01/14/2018   Insomnia 01/14/2018   Migraines 10/24/2017   Encounter for annual general medical examination with abnormal findings in adult 03/09/2015   Ganglion cyst of right foot 03/09/2015   Plantar fasciitis, bilateral 02/11/2014   Adjustment disorder with mixed anxiety and depressed mood 02/11/2014    Past Surgical History:  Procedure Laterality Date   CHOLECYSTECTOMY  07/17/2019   COLONOSCOPY WITH PROPOFOL  N/A 09/05/2022   Procedure: COLONOSCOPY WITH  PROPOFOL ;  Surgeon: Therisa Bi, MD;  Location: [facility] ENDOSCOPY;  Service: Gastroenterology;  Laterality: N/A;   ESOPHAGOGASTRODUODENOSCOPY (EGD) WITH PROPOFOL  N/A 09/05/2022   Procedure: ESOPHAGOGASTRODUODENOSCOPY (EGD) WITH PROPOFOL ;  Surgeon: Therisa Bi, MD;  Location: [facility] ENDOSCOPY;  Service: Gastroenterology;  Laterality: N/A;   WISDOM TOOTH EXTRACTION      OB History     Gravida  3   Para  2   Term  2   Preterm      AB  1   Living  2      SAB      IAB  0   Ectopic      Multiple      Live Births  2            Home Medications    Prior to Admission medications   Medication Sig Start Date End Date Taking? Authorizing Provider  oseltamivir  (TAMIFLU ) 75 MG capsule Take 1 capsule (75 mg total) by mouth every 12 (twelve) hours. 04/18/23  Yes Corlis Burnard DEL, NP  amLODipine  (NORVASC ) 10 MG tablet Take 1 tablet (10 mg total) by mouth daily. for blood pressure. 03/13/23   Gretta Comer POUR, NP  cyanocobalamin  (VITAMIN B12) 1000 MCG/ML injection Inject 1 mL (1,000 mcg total) into the muscle every 30 (thirty) days. 07/24/22   Melanee Annah JAYSON, MD  eletriptan (RELPAX) 40 MG tablet Take by mouth. 11/21/22   [provider]  ferrous sulfate 325 (65 FE) MG tablet  Take 325 mg by mouth daily with breakfast.    [provider]  hydrOXYzine  (ATARAX ) 10 MG tablet Take 10 mg by mouth 3 (three) times daily as needed for anxiety.    [provider]  meloxicam  (MOBIC ) 15 MG tablet Take 1 tablet (15 mg total) by mouth daily. Patient not taking: Reported on 01/23/2023 06/27/22   Janit Thresa HERO, DPM  Syringe/Needle, Disp, (SYRINGE 3CC/25GX1) 25G X 1 3 ML MISC 1 Syringe by Does not apply route every 30 (thirty) days. 07/24/22   Rao, Archana C, MD  venlafaxine (EFFEXOR) 37.5 MG tablet Take 37.5 mg by mouth daily.    [provider]  XULANE 150-35 MCG/24HR transdermal patch Place 1 patch onto the skin every Sunday.  05/23/19   [provider]    Family  History Family History  Problem Relation Age of Onset   Diabetes Father    Stroke Father    Heart disease Father    Hypertension Maternal Grandmother    Hypertension Maternal Grandfather    Diabetes Maternal Grandfather    Prostate cancer Maternal Grandfather    Diabetes Paternal Grandmother    Hypertension Maternal Aunt    Anesthesia problems Neg Hx     Social History Social History   Tobacco Use   Smoking status: Never   Smokeless tobacco: Never  Vaping Use   Vaping status: Never Used  Substance Use Topics   Alcohol use: Yes    Comment: rarely   Drug use: No     Allergies   Patient has no known allergies.   Review of Systems Review of Systems  Constitutional:  Positive for chills. Negative for fever.  HENT:  Positive for congestion, rhinorrhea and sore throat. Negative for ear pain.   Respiratory:  Positive for cough. Negative for shortness of breath.      Physical Exam Triage Vital Signs ED Triage Vitals [04/18/23 1502]  Encounter Vitals Group     BP 125/81     Systolic BP Percentile      Diastolic BP Percentile      Pulse Rate 99     Resp 18     Temp 100.1 F (37.8 C)     Temp src      SpO2 97 %     Weight      Height      Head Circumference      Peak Flow      Pain Score      Pain Loc      Pain Education      Exclude from Growth Chart    No data found.  Updated Vital Signs BP 125/81   Pulse 99   Temp 100.1 F (37.8 C)   Resp 18   SpO2 97%   Visual Acuity Right Eye Distance:   Left Eye Distance:   Bilateral Distance:    Right Eye Near:   Left Eye Near:    Bilateral Near:     Physical Exam Constitutional:      General: She is not in acute distress. HENT:     Right Ear: Tympanic membrane normal.     Left Ear: Tympanic membrane normal.     Nose: Rhinorrhea present.     Mouth/Throat:     Mouth: Mucous membranes are moist.     Pharynx: Posterior oropharyngeal erythema present.  Cardiovascular:     Rate and Rhythm: Normal  rate and regular rhythm.     Heart sounds: Normal heart sounds.  Pulmonary:     Effort: Pulmonary effort is normal. No respiratory distress.     Breath sounds: Normal breath sounds.  Neurological:     Mental Status: She is alert.      UC Treatments / Results  Labs (all labs ordered are listed, but only abnormal results are displayed) Labs Reviewed  POCT RAPID STREP A (OFFICE)  POC COVID19/FLU A&B COMBO    EKG   Radiology No results found.  Procedures Procedures (including critical care time)  Medications Ordered in UC Medications - No data to display  Initial Impression / Assessment and Plan / UC Course  I have reviewed the triage vital signs and the nursing notes.  Pertinent labs & imaging results that were available during my care of the patient were reviewed by me and considered in my medical decision making (see chart for details).    Influenza A.  Rapid flu positive for influenza A; COVID-negative.  Rapid strep negative.  Treating with Tamiflu .  Tylenol  or ibuprofen  as needed.  Education provided on influenza.  Instructed patient to follow up with her PCP if her symptoms are not improving.  She agrees to plan of care.   Final Clinical Impressions(s) / UC Diagnoses   Final diagnoses:  Influenza A     Discharge Instructions      Take the Tamiflu  as directed for Influenza A.    Follow-up with your primary care provider if your symptoms are not improving.      ED Prescriptions     Medication Sig Dispense Auth. Provider   oseltamivir  (TAMIFLU ) 75 MG capsule Take 1 capsule (75 mg total) by mouth every 12 (twelve) hours. 10 capsule Corlis Burnard DEL, NP      PDMP not reviewed this encounter.   Corlis Burnard DEL, NP 04/18/23 1540

## 2023-04-18 NOTE — Discharge Instructions (Signed)
 Take the Tamiflu as directed for Influenza A.    Follow-up with your primary care provider if your symptoms are not improving.

## 2023-06-25 DIAGNOSIS — E0500 Thyrotoxicosis with diffuse goiter without thyrotoxic crisis or storm: Secondary | ICD-10-CM | POA: Diagnosis not present

## 2023-07-23 ENCOUNTER — Other Ambulatory Visit: Payer: Self-pay | Admitting: Medical Genetics

## 2023-07-24 ENCOUNTER — Other Ambulatory Visit: Payer: Self-pay

## 2023-07-24 ENCOUNTER — Inpatient Hospital Stay: Payer: BC Managed Care – PPO | Attending: Oncology

## 2023-07-24 DIAGNOSIS — E538 Deficiency of other specified B group vitamins: Secondary | ICD-10-CM | POA: Insufficient documentation

## 2023-07-24 DIAGNOSIS — D509 Iron deficiency anemia, unspecified: Secondary | ICD-10-CM | POA: Diagnosis not present

## 2023-07-24 LAB — CBC (CANCER CENTER ONLY)
HCT: 42.6 % (ref 36.0–46.0)
Hemoglobin: 13.6 g/dL (ref 12.0–15.0)
MCH: 28.3 pg (ref 26.0–34.0)
MCHC: 31.9 g/dL (ref 30.0–36.0)
MCV: 88.6 fL (ref 80.0–100.0)
Platelet Count: 301 10*3/uL (ref 150–400)
RBC: 4.81 MIL/uL (ref 3.87–5.11)
RDW: 14.1 % (ref 11.5–15.5)
WBC Count: 9.2 10*3/uL (ref 4.0–10.5)
nRBC: 0 % (ref 0.0–0.2)

## 2023-07-24 LAB — IRON AND TIBC
Iron: 85 ug/dL (ref 28–170)
Saturation Ratios: 17 % (ref 10.4–31.8)
TIBC: 489 ug/dL — ABNORMAL HIGH (ref 250–450)
UIBC: 404 ug/dL

## 2023-07-24 LAB — FERRITIN: Ferritin: 21 ng/mL (ref 11–307)

## 2023-07-25 ENCOUNTER — Other Ambulatory Visit
Admission: RE | Admit: 2023-07-25 | Discharge: 2023-07-25 | Disposition: A | Discharge status: Home / Self Care | Payer: Self-pay | Source: Ambulatory Visit | Attending: Medical Genetics | Admitting: Medical Genetics

## 2023-07-25 ENCOUNTER — Telehealth: Payer: Self-pay

## 2023-07-25 NOTE — Telephone Encounter (Signed)
-----   Message from Creig Hines sent at 07/25/2023  8:47 AM EDT ----- Danielle Maxwell is not anemic but is iron deficient. Does Danielle Maxwell want iv iron?

## 2023-07-25 NOTE — Telephone Encounter (Signed)
 Per Dr. Smith Robert "She is not anemic but is iron deficient. Does she want iv iron?".  Outbound call to patient; informed of above. Patient is open to receiving iron infusions; states she's been taking iron supplements PO for one year.  Informed scheduling will be in touch shortly and to contact clinic if any additional questions / concerns arise.

## 2023-07-30 ENCOUNTER — Other Ambulatory Visit: Payer: Self-pay | Admitting: Oncology

## 2023-07-30 ENCOUNTER — Inpatient Hospital Stay

## 2023-07-30 VITALS — BP 126/83 | HR 89 | Temp 98.0°F | Resp 16

## 2023-07-30 DIAGNOSIS — E538 Deficiency of other specified B group vitamins: Secondary | ICD-10-CM | POA: Diagnosis not present

## 2023-07-30 DIAGNOSIS — E611 Iron deficiency: Secondary | ICD-10-CM

## 2023-07-30 DIAGNOSIS — D509 Iron deficiency anemia, unspecified: Secondary | ICD-10-CM | POA: Diagnosis not present

## 2023-07-30 MED ORDER — IRON SUCROSE 20 MG/ML IV SOLN
200.0000 mg | INTRAVENOUS | Status: DC
  Administered 2023-07-30: 200 mg via INTRAVENOUS
  Filled 2023-07-30: qty 10

## 2023-07-30 NOTE — Patient Instructions (Signed)

## 2023-08-05 LAB — GENECONNECT MOLECULAR SCREEN: Genetic Analysis Overall Interpretation: NEGATIVE

## 2023-08-08 ENCOUNTER — Inpatient Hospital Stay

## 2023-08-08 VITALS — BP 151/102 | HR 91 | Temp 97.5°F | Resp 16

## 2023-08-08 DIAGNOSIS — D509 Iron deficiency anemia, unspecified: Secondary | ICD-10-CM | POA: Diagnosis not present

## 2023-08-08 DIAGNOSIS — E611 Iron deficiency: Secondary | ICD-10-CM

## 2023-08-08 DIAGNOSIS — E538 Deficiency of other specified B group vitamins: Secondary | ICD-10-CM | POA: Diagnosis not present

## 2023-08-08 MED ORDER — SODIUM CHLORIDE 0.9% FLUSH
10.0000 mL | Freq: Once | INTRAVENOUS | Status: AC | PRN
  Administered 2023-08-08: 10 mL
  Filled 2023-08-08: qty 10

## 2023-08-08 MED ORDER — IRON SUCROSE 20 MG/ML IV SOLN
200.0000 mg | INTRAVENOUS | Status: DC
  Administered 2023-08-08: 200 mg via INTRAVENOUS
  Filled 2023-08-08: qty 10

## 2023-08-08 MED ORDER — PROCHLORPERAZINE MALEATE 10 MG PO TABS
10.0000 mg | ORAL_TABLET | Freq: Once | ORAL | Status: AC
  Administered 2023-08-08: 10 mg via ORAL

## 2023-08-08 NOTE — Progress Notes (Signed)
 Pt experiencing Nausea. B/P elevated. Per Dr. Randy Buttery give Compazine  10 mg PO x1. Pt to monitor B/P at home.  Per Pt "I just want to go lay down" pt stable at discharge, pt educated to call clinic if nausea worsens, to monitor B/P at home, contact PCP if B/P remains elevated, or to seek emergency care, if she develops severe headache, slurred speech , weakness, or other Hypertension symptoms. Pt verbalizes understanding.

## 2023-08-13 ENCOUNTER — Inpatient Hospital Stay

## 2023-08-13 VITALS — BP 156/98 | HR 87 | Temp 97.6°F | Resp 16

## 2023-08-13 DIAGNOSIS — E538 Deficiency of other specified B group vitamins: Secondary | ICD-10-CM | POA: Diagnosis not present

## 2023-08-13 DIAGNOSIS — E611 Iron deficiency: Secondary | ICD-10-CM

## 2023-08-13 DIAGNOSIS — D509 Iron deficiency anemia, unspecified: Secondary | ICD-10-CM | POA: Diagnosis not present

## 2023-08-13 MED ORDER — IRON SUCROSE 20 MG/ML IV SOLN
200.0000 mg | INTRAVENOUS | Status: DC
  Administered 2023-08-13: 200 mg via INTRAVENOUS
  Filled 2023-08-13: qty 10

## 2023-08-13 MED ORDER — SODIUM CHLORIDE 0.9% FLUSH
10.0000 mL | Freq: Once | INTRAVENOUS | Status: AC | PRN
  Administered 2023-08-13: 10 mL
  Filled 2023-08-13: qty 10

## 2023-08-13 NOTE — Progress Notes (Signed)
Patient tolerated Venofer infusion well. Explained recommendation of 30 min post monitoring. Patient refused to wait post monitoring. Educated on what signs to watch for & to call with any concerns. No question, discharged. Stable  

## 2023-08-16 ENCOUNTER — Encounter: Payer: Self-pay | Admitting: Internal Medicine

## 2023-08-16 ENCOUNTER — Other Ambulatory Visit: Payer: Self-pay

## 2023-08-16 ENCOUNTER — Ambulatory Visit: Payer: BC Managed Care – PPO | Admitting: Internal Medicine

## 2023-08-16 VITALS — BP 154/92 | HR 94 | Temp 98.2°F | Resp 16 | Ht 63.0 in | Wt 188.0 lb

## 2023-08-16 DIAGNOSIS — G43009 Migraine without aura, not intractable, without status migrainosus: Secondary | ICD-10-CM

## 2023-08-16 DIAGNOSIS — R7303 Prediabetes: Secondary | ICD-10-CM | POA: Diagnosis not present

## 2023-08-16 DIAGNOSIS — I10 Essential (primary) hypertension: Secondary | ICD-10-CM

## 2023-08-16 DIAGNOSIS — E611 Iron deficiency: Secondary | ICD-10-CM

## 2023-08-16 DIAGNOSIS — E0500 Thyrotoxicosis with diffuse goiter without thyrotoxic crisis or storm: Secondary | ICD-10-CM | POA: Diagnosis not present

## 2023-08-16 DIAGNOSIS — E559 Vitamin D deficiency, unspecified: Secondary | ICD-10-CM | POA: Diagnosis not present

## 2023-08-16 DIAGNOSIS — G2581 Restless legs syndrome: Secondary | ICD-10-CM | POA: Diagnosis not present

## 2023-08-16 DIAGNOSIS — F419 Anxiety disorder, unspecified: Secondary | ICD-10-CM | POA: Insufficient documentation

## 2023-08-16 MED ORDER — HYDROXYZINE HCL 10 MG PO TABS: 10.0000 mg | ORAL_TABLET | Freq: Three times a day (TID) | ORAL | 1 refills | Status: AC | PRN

## 2023-08-16 MED ORDER — METOPROLOL SUCCINATE ER 25 MG PO TB24: 12.5000 mg | ORAL_TABLET | Freq: Every day | ORAL | 1 refills | Status: DC

## 2023-08-16 MED ORDER — BUSPIRONE HCL 10 MG PO TABS: 10.0000 mg | ORAL_TABLET | Freq: Two times a day (BID) | ORAL | 1 refills | Status: DC

## 2023-08-16 MED ORDER — GABAPENTIN 300 MG PO CAPS: 300.0000 mg | ORAL_CAPSULE | Freq: Every evening | ORAL | 0 refills | Status: AC | PRN

## 2023-08-16 NOTE — Progress Notes (Signed)
 New Patient Office Visit  Subjective    Patient ID: Danielle Maxwell, female    DOB: 07/17/1980  Age: 43 y.o. MRN: [id number]  CC:  Chief Complaint  Patient presents with   Establish Care   Hypertension    Elevated BP seen in ER    HPI Danielle Maxwell presents to establish care.  Hypertension: -Medications: Nothing currently - had been on Amlodipine  but didn't control BP well. Had also been taking an OTC supplement which appears to have helped -Patient is compliant with above medications and reports no side effects. -Checking BP at home (average): 120-130/90-95 -Denies any SOB, CP, vision changes, LE edema or symptoms of hypotension  Graves' Disease: -Follows with Endocrinology - Dr. Kathyanne Parkers in [place], last seen in January  -First diagnosed last year, had been on Methimazole every other day and eventually got off of it, no issues for 1 year but now with symptoms again -Current symptoms: always hot, sweating, palpations and anxiety   Pre-Diabetes:  -Last A1c 11/24 5.8% -Not currently on medications  Hx of Anemia/Iron  Deficiency:  -Following with Hematology  -Going through iron  transfusions  Restless Leg Syndrome:  -Taking Gabapentin  100 mg bedtime PRN but without relief -Following with Neurology   Migraines:  -currently on Effexor 37.5 mg BID migraine prevention  -Takes Relpax as an abortive  - taking every other month  -Following with Neurology  Anxiety:  -Currently on Buspar  5 mg BID, Hydroxyzine  10 mg PRN   Health Maintenance: -Blood work due -Mammogram 12/24 Birads-2 -Colon cancer screening: colonoscopy 5/24, repeat in 10 years   Outpatient Encounter Medications as of 08/16/2023  Medication Sig   busPIRone  (BUSPAR ) 5 MG tablet Take 5 mg by mouth 2 (two) times daily as needed.   eletriptan (RELPAX) 40 MG tablet Take by mouth.   gabapentin  (NEURONTIN ) 100 MG capsule Take 100 mg by mouth at bedtime as needed.   hydrOXYzine  (ATARAX ) 10 MG tablet Take 10 mg by mouth  3 (three) times daily as needed for anxiety.   venlafaxine (EFFEXOR) 37.5 MG tablet Take 37.5 mg by mouth daily.   XULANE 150-35 MCG/24HR transdermal patch Place 1 patch onto the skin every Sunday.    amLODipine  (NORVASC ) 10 MG tablet Take 1 tablet (10 mg total) by mouth daily. for blood pressure. (Patient not taking: Reported on 08/16/2023)   cyanocobalamin  (VITAMIN B12) 1000 MCG/ML injection Inject 1 mL (1,000 mcg total) into the muscle every 30 (thirty) days. (Patient not taking: Reported on 08/16/2023)   ferrous sulfate 325 (65 FE) MG tablet Take 325 mg by mouth daily with breakfast. (Patient not taking: Reported on 08/16/2023)   meloxicam  (MOBIC ) 15 MG tablet Take 1 tablet (15 mg total) by mouth daily. (Patient not taking: Reported on 01/23/2023)   oseltamivir  (TAMIFLU ) 75 MG capsule Take 1 capsule (75 mg total) by mouth every 12 (twelve) hours.   Syringe/Needle, Disp, (SYRINGE 3CC/25GX1") 25G X 1" 3 ML MISC 1 Syringe by Does not apply route every 30 (thirty) days. (Patient not taking: Reported on 08/16/2023)   No facility-administered encounter medications on file as of 08/16/2023.    Past Medical History:  Diagnosis Date   Abnormal Pap smear 04/17/2000   Depression 04/17/2006   Epigastric pain 08/11/2022   H/O chlamydia infection    H/O rubella    H/O urinary frequency 09/22/2002   History of emergence delirium    History of varicella    Hx: UTI (urinary tract infection) 04/17/2009   Hypothyroidism  IDA (iron  deficiency anemia)    Migraine    Monilial vaginitis 06/12/2003   NSVD (normal spontaneous vaginal delivery) 12/31/2011   2227   Polyhydramnios in second trimester    Sinus tachycardia 02/17/2022   Yeast infection     Past Surgical History:  Procedure Laterality Date   CHOLECYSTECTOMY  07/17/2019   COLONOSCOPY WITH PROPOFOL  N/A 09/05/2022   Procedure: COLONOSCOPY WITH PROPOFOL ;  Surgeon: Luke Salaam, MD;  Location: [facility] ENDOSCOPY;  Service: Gastroenterology;  Laterality: N/A;    ESOPHAGOGASTRODUODENOSCOPY (EGD) WITH PROPOFOL  N/A 09/05/2022   Procedure: ESOPHAGOGASTRODUODENOSCOPY (EGD) WITH PROPOFOL ;  Surgeon: Luke Salaam, MD;  Location: [facility] ENDOSCOPY;  Service: Gastroenterology;  Laterality: N/A;   WISDOM TOOTH EXTRACTION      Family History  Problem Relation Age of Onset   Diabetes Father    Stroke Father    Heart disease Father    Hypertension Maternal Grandmother    Hypertension Maternal Grandfather    Diabetes Maternal Grandfather    Prostate cancer Maternal Grandfather    Diabetes Paternal Grandmother    Hypertension Maternal Aunt    Anesthesia problems Neg Hx     Social History   Socioeconomic History   Marital status: Married    Spouse name: Not on file   Number of children: Not on file   Years of education: Not on file   Highest education level: Bachelor's degree (e.g., BA, AB, BS)  Occupational History   Not on file  Tobacco Use   Smoking status: Never    Passive exposure: Never   Smokeless tobacco: Never  Vaping Use   Vaping status: Never Used  Substance and Sexual Activity   Alcohol use: Yes    Comment: rarely   Drug use: No   Sexual activity: Yes    Partners: Male    Birth control/protection: None  Other Topics Concern   Not on file  Social History Narrative   Married.   2 children.   Works at [employer].   Enjoys reading.    Social Drivers of Corporate investment banker Strain: Low Risk  (08/12/2023)   Overall Financial Resource Strain (CARDIA)    Difficulty of Paying Living Expenses: Not hard at all  Food Insecurity: No Food Insecurity (08/12/2023)   Hunger Vital Sign    Worried About Running Out of Food in the Last Year: Never true    Ran Out of Food in the Last Year: Never true  Transportation Needs: No Transportation Needs (08/12/2023)   PRAPARE - Administrator, Civil Service (Medical): No    Lack of Transportation (Non-Medical): No  Physical Activity: Insufficiently Active (08/12/2023)   Exercise Vital  Sign    Days of Exercise per Week: 1 day    Minutes of Exercise per Session: 10 min  Stress: Stress Concern Present (08/12/2023)   [employer] of Occupational Health - Occupational Stress Questionnaire    Feeling of Stress : Very much  Social Connections: Moderately Isolated (08/12/2023)   Social Connection and Isolation Panel [NHANES]    Frequency of Communication with Friends and Family: More than three times a week    Frequency of Social Gatherings with Friends and Family: Twice a week    Attends Religious Services: Never    Database administrator or Organizations: No    Attends Banker Meetings: Not on file    Marital Status: Married  Intimate Partner Violence: Unknown (08/22/2021)   Received from [employer], [facility]   HITS  Physically Hurt: Not on file    Insult or Talk Down To: Not on file    Threaten Physical Harm: Not on file    Scream or Curse: Not on file    Review of Systems  Constitutional:  Negative for chills and fever.  Cardiovascular:  Positive for palpitations.  Psychiatric/Behavioral:  The patient is nervous/anxious.         Objective    BP (!) 154/92 (Cuff Size: Large)   Pulse 94   Temp 98.2 F (36.8 C) (Oral)   Resp 16   Ht 5\' 3"  (1.6 m)   Wt 188 lb (85.3 kg)   LMP 08/16/2023   SpO2 98%   BMI 33.30 kg/m   Physical Exam Constitutional:      Appearance: Normal appearance.  HENT:     Head: Normocephalic and atraumatic.     Mouth/Throat:     Mouth: Mucous membranes are moist.     Pharynx: Oropharynx is clear.  Eyes:     Extraocular Movements: Extraocular movements intact.     Conjunctiva/sclera: Conjunctivae normal.     Pupils: Pupils are equal, round, and reactive to light.  Neck:     Comments: Diffusely enlarged thyroid  Cardiovascular:     Rate and Rhythm: Normal rate and regular rhythm.  Pulmonary:     Effort: Pulmonary effort is normal.     Breath sounds: Normal breath sounds.  Musculoskeletal:      Cervical back: No tenderness.     Right lower leg: No edema.     Left lower leg: No edema.  Lymphadenopathy:     Cervical: No cervical adenopathy.  Skin:    General: Skin is warm and dry.  Neurological:     General: No focal deficit present.     Mental Status: She is alert. Mental status is at baseline.  Psychiatric:        Mood and Affect: Mood normal.        Behavior: Behavior normal.         Assessment & Plan:   Assessment & Plan Hypertension Intermittent elevated blood pressure likely due to stress and thyroid  dysfunction. Amlodipine  ineffective. Slightly elevated diastolic pressure. Metoprolol  may manage blood pressure and palpitations. Family history of myocardial infarction and heart failure increases risk. - Start metoprolol  12.5 mg daily. - Monitor blood pressure at home. - Reassess blood pressure in six months or sooner if elevated.  Graves' disease Symptoms of hyperthyroidism present. Thyroid  dysfunction may affect blood pressure and weight. Endocrinology follow-up pending. - Order thyroid  panel (TSH, T4). - Send results to endocrinologist. - Start low dose beta blocker for symptomatic relief.  Prediabetes A1c previously at 5.8. Weight loss recommended for glucose management. Lifestyle modifications encouraged, prioritize thyroid  control before pharmacotherapy for weight loss. - Recheck A1c. - Encourage weight loss through lifestyle modifications.  Iron  deficiency anemia Severe anemia resolved, low iron  stores persist. Oral iron  ineffective, infusions required. No significant blood loss identified. - Consider periodic iron  infusions as needed. - Following with Hematology   Anxiety Anxiety persists despite Buspar  and hydroxyzine . Current Buspar  dose may be insufficient. - Increase Buspar  to 10 mg twice daily. - Continue hydroxyzine  as needed for acute anxiety.  Restless legs syndrome Inadequately controlled with current gabapentin  dose. Non-pharmacological  measures used for relief. - Increase gabapentin  to 300 mg at bedtime as needed. - Continue non-pharmacological measures for symptom relief.  Migraine Migraines well-controlled with Effexor and eletriptan. Significant improvement in frequency and severity. - Continue Effexor 37.5 mg  twice daily. - Continue eletriptan as needed for acute migraine.  - COMPLETE METABOLIC PANEL WITHOUT GFR - metoprolol  succinate (TOPROL -XL) 25 MG 24 hr tablet; Take 0.5 tablets (12.5 mg total) by mouth daily.  Dispense: 45 tablet; Refill: 1 - Thyroid  Panel With TSH - gabapentin  (NEURONTIN ) 300 MG capsule; Take 1 capsule (300 mg total) by mouth at bedtime as needed (Restless leg).  Dispense: 90 capsule; Refill: 0 - HgB A1c - busPIRone  (BUSPAR ) 10 MG tablet; Take 1 tablet (10 mg total) by mouth 2 (two) times daily.  Dispense: 180 tablet; Refill: 1 - hydrOXYzine  (ATARAX ) 10 MG tablet; Take 1 tablet (10 mg total) by mouth 3 (three) times daily as needed for anxiety.  Dispense: 90 tablet; Refill: 1 - Vitamin D  (25 hydroxy)    Return in about 6 months (around 02/16/2024).   Rockney Cid, DO

## 2023-08-17 ENCOUNTER — Encounter: Payer: Self-pay | Admitting: Internal Medicine

## 2023-08-17 LAB — THYROID PANEL WITH TSH
Free Thyroxine Index: 2.1 (ref 1.4–3.8)
T3 Uptake: 22 % (ref 22–35)
T4, Total: 9.6 ug/dL (ref 5.1–11.9)
TSH: 2.05 m[IU]/L

## 2023-08-17 LAB — COMPLETE METABOLIC PANEL WITHOUT GFR
AG Ratio: 1.3 (calc) (ref 1.0–2.5)
ALT: 8 U/L (ref 6–29)
AST: 16 U/L (ref 10–30)
Albumin: 4 g/dL (ref 3.6–5.1)
Alkaline phosphatase (APISO): 38 U/L (ref 31–125)
BUN/Creatinine Ratio: 7 (calc) (ref 6–22)
BUN: 6 mg/dL — ABNORMAL LOW (ref 7–25)
CO2: 25 mmol/L (ref 20–32)
Calcium: 9.3 mg/dL (ref 8.6–10.2)
Chloride: 103 mmol/L (ref 98–110)
Creat: 0.81 mg/dL (ref 0.50–0.99)
Globulin: 3.1 g/dL (ref 1.9–3.7)
Glucose, Bld: 78 mg/dL (ref 65–99)
Potassium: 4.2 mmol/L (ref 3.5–5.3)
Sodium: 138 mmol/L (ref 135–146)
Total Bilirubin: 0.3 mg/dL (ref 0.2–1.2)
Total Protein: 7.1 g/dL (ref 6.1–8.1)

## 2023-08-17 LAB — VITAMIN D 25 HYDROXY (VIT D DEFICIENCY, FRACTURES): Vit D, 25-Hydroxy: 46 ng/mL (ref 30–100)

## 2023-08-17 LAB — HEMOGLOBIN A1C
Hgb A1c MFr Bld: 5.6 % (ref <5.7)
Mean Plasma Glucose: 114 mg/dL
eAG (mmol/L): 6.3 mmol/L

## 2023-08-20 ENCOUNTER — Inpatient Hospital Stay: Attending: Oncology

## 2023-08-20 VITALS — BP 145/93 | HR 79 | Temp 97.7°F | Resp 16

## 2023-08-20 DIAGNOSIS — D509 Iron deficiency anemia, unspecified: Secondary | ICD-10-CM | POA: Insufficient documentation

## 2023-08-20 DIAGNOSIS — E611 Iron deficiency: Secondary | ICD-10-CM

## 2023-08-20 MED ORDER — IRON SUCROSE 20 MG/ML IV SOLN
200.0000 mg | INTRAVENOUS | Status: DC
  Administered 2023-08-20: 200 mg via INTRAVENOUS
  Filled 2023-08-20: qty 10

## 2023-08-20 MED ORDER — SODIUM CHLORIDE 0.9% FLUSH
10.0000 mL | Freq: Once | INTRAVENOUS | Status: AC | PRN
Start: 2023-08-20 — End: 2023-08-20
  Administered 2023-08-20: 10 mL
  Filled 2023-08-20: qty 10

## 2023-08-20 NOTE — Progress Notes (Signed)
Patient tolerated Venofer infusion well. Explained recommendation of 30 min post monitoring. Patient refused to wait post monitoring. Educated on what signs to watch for & to call with any concerns. No questions, discharged. Stable  

## 2023-08-20 NOTE — Patient Instructions (Signed)

## 2023-08-27 ENCOUNTER — Inpatient Hospital Stay

## 2023-08-27 VITALS — BP 132/88 | HR 82 | Temp 98.8°F | Resp 16

## 2023-08-27 DIAGNOSIS — E611 Iron deficiency: Secondary | ICD-10-CM

## 2023-08-27 DIAGNOSIS — D509 Iron deficiency anemia, unspecified: Secondary | ICD-10-CM | POA: Diagnosis not present

## 2023-08-27 MED ORDER — IRON SUCROSE 20 MG/ML IV SOLN
200.0000 mg | INTRAVENOUS | Status: DC
  Administered 2023-08-27: 200 mg via INTRAVENOUS
  Filled 2023-08-27: qty 10

## 2023-08-27 NOTE — Progress Notes (Signed)
 Patient tolerated Venofer  without any difficulty. She denied staying for the 30 minute post Venofer  observation. Vital signs stable at discharge.

## 2023-09-03 ENCOUNTER — Inpatient Hospital Stay

## 2023-09-05 ENCOUNTER — Ambulatory Visit
Admission: RE | Admit: 2023-09-05 | Discharge: 2023-09-05 | Disposition: A | Discharge status: Home / Self Care | Source: Ambulatory Visit | Attending: Emergency Medicine | Admitting: Emergency Medicine

## 2023-09-05 VITALS — BP 137/89 | HR 84 | Temp 97.6°F | Resp 18

## 2023-09-05 DIAGNOSIS — M79631 Pain in right forearm: Secondary | ICD-10-CM

## 2023-09-05 DIAGNOSIS — I82611 Acute embolism and thrombosis of superficial veins of right upper extremity: Secondary | ICD-10-CM | POA: Diagnosis not present

## 2023-09-05 NOTE — ED Triage Notes (Signed)
 Patient to Urgent Care with complaints of right sided arm pain.  Reports symptoms started last Monday. Started after having iron  infusion in the same arm (pain not located where the iv was inserted). Unsure if she injured her arm. Pain has improved since last week.

## 2023-09-05 NOTE — ED Notes (Addendum)
 Centralized scheduling contacted to schedule DVT study. Patient to arrive at [facility] @ 1245.

## 2023-09-05 NOTE — Discharge Instructions (Addendum)
 Go to [facility] today at 12:45 pm for the ultrasound of your arm.    Follow up with your primary care provider tomorrow.  Go to the emergency department if you have worsening symptoms.

## 2023-09-05 NOTE — ED Provider Notes (Signed)
 Danielle Maxwell    CSN: [id number] Arrival date & time: 09/05/23  1111      History   Chief Complaint Chief Complaint  Patient presents with   Arm Injury    Entered by patient    HPI Danielle Maxwell is a 43 y.o. female.  Patient presents with right forearm pain x 9 days which started after she had an iron  infusion.  She had the iron  infusion on 08/27/2023.  She woke up with the pain in her arm the next day.  She states the infusion was in the right antecubital space.  Her pain is on the underside of her forearm.  No falls or injury.  She states there was a "lump" there initially.  She massaged it and it has improved.  No numbness, weakness, redness, bruising, fever, chest pain, shortness of breath.  No joint pain or swelling.  The history is provided by the patient and medical records.    Past Medical History:  Diagnosis Date   Abnormal Pap smear 04/17/2000   Depression 04/17/2006   Epigastric pain 08/11/2022   H/O chlamydia infection    H/O rubella    H/O urinary frequency 09/22/2002   History of emergence delirium    History of varicella    Hx: UTI (urinary tract infection) 04/17/2009   Hypothyroidism    IDA (iron  deficiency anemia)    Migraine    Monilial vaginitis 06/12/2003   NSVD (normal spontaneous vaginal delivery) 12/31/2011   2227   Polyhydramnios in second trimester    Sinus tachycardia 02/17/2022   Yeast infection     Patient Active Problem List   Diagnosis Date Noted   Graves disease 08/16/2023   RLS (restless legs syndrome) 08/16/2023   Prediabetes 08/16/2023   Anxiety 08/16/2023   Primary hypertension 02/21/2023   Iron  deficiency 09/05/2022   Chronic idiopathic constipation 08/11/2022   Irritable bowel syndrome 08/11/2022   Low serum thyroid  stimulating hormone (TSH) 02/17/2022   Fatigue 07/27/2021   GERD (gastroesophageal reflux disease) 03/20/2019   Frequent headaches 01/14/2018   Insomnia 01/14/2018   Migraines 10/24/2017   Encounter  for annual general medical examination with abnormal findings in adult 03/09/2015   Ganglion cyst of right foot 03/09/2015   Plantar fasciitis, bilateral 02/11/2014   Adjustment disorder with mixed anxiety and depressed mood 02/11/2014    Past Surgical History:  Procedure Laterality Date   CHOLECYSTECTOMY  07/17/2019   COLONOSCOPY WITH PROPOFOL  N/A 09/05/2022   Procedure: COLONOSCOPY WITH PROPOFOL ;  Surgeon: Luke Salaam, MD;  Location: [facility] ENDOSCOPY;  Service: Gastroenterology;  Laterality: N/A;   ESOPHAGOGASTRODUODENOSCOPY (EGD) WITH PROPOFOL  N/A 09/05/2022   Procedure: ESOPHAGOGASTRODUODENOSCOPY (EGD) WITH PROPOFOL ;  Surgeon: Luke Salaam, MD;  Location: [facility] ENDOSCOPY;  Service: Gastroenterology;  Laterality: N/A;   WISDOM TOOTH EXTRACTION      OB History     Gravida  3   Para  2   Term  2   Preterm      AB  1   Living  2      SAB      IAB  0   Ectopic      Multiple      Live Births  2            Home Medications    Prior to Admission medications   Medication Sig Start Date End Date Taking? Authorizing Provider  busPIRone  (BUSPAR ) 10 MG tablet Take 1 tablet (10 mg total) by mouth 2 (two) times daily.  08/16/23   Rockney Cid, DO  cyanocobalamin  (VITAMIN B12) 1000 MCG/ML injection Inject 1 mL (1,000 mcg total) into the muscle every 30 (thirty) days. Patient not taking: Reported on 08/16/2023 07/24/22   Avonne Boettcher, MD  eletriptan (RELPAX) 40 MG tablet Take by mouth. 11/21/22   [provider]  gabapentin  (NEURONTIN ) 300 MG capsule Take 1 capsule (300 mg total) by mouth at bedtime as needed (Restless leg). 08/16/23   Rockney Cid, DO  hydrOXYzine  (ATARAX ) 10 MG tablet Take 1 tablet (10 mg total) by mouth 3 (three) times daily as needed for anxiety. 08/16/23   Rockney Cid, DO  metoprolol  succinate (TOPROL -XL) 25 MG 24 hr tablet Take 0.5 tablets (12.5 mg total) by mouth daily. 08/16/23   Rockney Cid, DO  Syringe/Needle, Disp, (SYRINGE  3CC/25GX1") 25G X 1" 3 ML MISC 1 Syringe by Does not apply route every 30 (thirty) days. Patient not taking: Reported on 08/16/2023 07/24/22   Rao, Archana C, MD  venlafaxine (EFFEXOR) 37.5 MG tablet Take 37.5 mg by mouth daily.    [provider]  XULANE 150-35 MCG/24HR transdermal patch Place 1 patch onto the skin every Sunday.  05/23/19   [provider]    Family History Family History  Problem Relation Age of Onset   Diabetes Father    Stroke Father    Heart disease Father    Hypertension Maternal Grandmother    Hypertension Maternal Grandfather    Diabetes Maternal Grandfather    Prostate cancer Maternal Grandfather    Diabetes Paternal Grandmother    Hypertension Maternal Aunt    Anesthesia problems Neg Hx     Social History Social History   Tobacco Use   Smoking status: Never    Passive exposure: Never   Smokeless tobacco: Never  Vaping Use   Vaping status: Never Used  Substance Use Topics   Alcohol use: Yes    Comment: rarely   Drug use: No     Allergies   Patient has no known allergies.   Review of Systems Review of Systems  Constitutional:  Negative for chills and fever.  Respiratory:  Negative for cough and shortness of breath.   Cardiovascular:  Negative for chest pain and palpitations.  Musculoskeletal:  Positive for myalgias. Negative for arthralgias and joint swelling.  Skin:  Negative for color change, rash and wound.  Neurological:  Negative for weakness and numbness.     Physical Exam Triage Vital Signs ED Triage Vitals  Encounter Vitals Group     BP 09/05/23 1118 137/89     Systolic BP Percentile --      Diastolic BP Percentile --      Pulse Rate 09/05/23 1118 84     Resp 09/05/23 1118 18     Temp 09/05/23 1118 97.6 F (36.4 C)     Temp src --      SpO2 09/05/23 1118 96 %     Weight --      Height --      Head Circumference --      Peak Flow --      Pain Score 09/05/23 1122 4     Pain Loc --      Pain Education --       Exclude from Growth Chart --    No data found.  Updated Vital Signs BP 137/89   Pulse 84   Temp 97.6 F (36.4 C)   Resp 18   LMP 08/16/2023   SpO2 96%  Visual Acuity Right Eye Distance:   Left Eye Distance:   Bilateral Distance:    Right Eye Near:   Left Eye Near:    Bilateral Near:     Physical Exam Constitutional:      General: She is not in acute distress. HENT:     Mouth/Throat:     Mouth: Mucous membranes are moist.  Cardiovascular:     Rate and Rhythm: Normal rate and regular rhythm.  Pulmonary:     Effort: Pulmonary effort is normal. No respiratory distress.  Musculoskeletal:        General: Tenderness present. No swelling or deformity. Normal range of motion.       Arms:  Skin:    General: Skin is warm and dry.     Capillary Refill: Capillary refill takes less than 2 seconds.     Findings: No bruising, erythema, lesion or rash.  Neurological:     General: No focal deficit present.     Mental Status: She is alert.     Sensory: No sensory deficit.     Motor: No weakness.      UC Treatments / Results  Labs (all labs ordered are listed, but only abnormal results are displayed) Labs Reviewed - No data to display  EKG   Radiology US  Venous Img Upper Uni Right(DVT) Result Date: 09/05/2023 CLINICAL DATA:  Right forearm pain. EXAM: RIGHT UPPER EXTREMITY VENOUS DOPPLER ULTRASOUND TECHNIQUE: Gray-scale sonography with graded compression, as well as color Doppler and duplex ultrasound were performed to evaluate the upper extremity deep venous system from the level of the subclavian vein and including the jugular, axillary, basilic, radial, ulnar and upper cephalic vein. Spectral Doppler was utilized to evaluate flow at rest and with distal augmentation maneuvers. COMPARISON:  None Available. FINDINGS: Contralateral Subclavian Vein: Respiratory phasicity is normal and symmetric with the symptomatic side. No evidence of thrombus. Normal compressibility.  Internal Jugular Vein: No evidence of thrombus. Normal compressibility, respiratory phasicity and response to augmentation. Subclavian Vein: No evidence of thrombus. Normal compressibility, respiratory phasicity and response to augmentation. Axillary Vein: No evidence of thrombus. Normal compressibility, respiratory phasicity and response to augmentation. Cephalic Vein: No evidence of thrombus. Normal compressibility, respiratory phasicity and response to augmentation. Basilic Vein: No evidence of thrombus. Normal compressibility, respiratory phasicity and response to augmentation. Brachial Veins: No evidence of thrombus. Normal compressibility, respiratory phasicity and response to augmentation. Radial Veins: No evidence of thrombus. Normal compressibility, respiratory phasicity and response to augmentation. Ulnar Veins: No evidence of thrombus. Normal compressibility, respiratory phasicity and response to augmentation. Venous Reflux:  None visualized. Other Findings: Superficial venous thrombosis seen proximal to the medial elbow. IMPRESSION: 1. No evidence of DVT within the right upper extremity. 2. Superficial venous thrombosis seen proximal to the medial elbow. Electronically Signed   By: Tyron Gallon M.D.   On: 09/05/2023 15:36    Procedures Procedures (including critical care time)  Medications Ordered in UC Medications - No data to display  Initial Impression / Assessment and Plan / UC Course  I have reviewed the triage vital signs and the nursing notes.  Pertinent labs & imaging results that were available during my care of the patient were reviewed by me and considered in my medical decision making (see chart for details).    Right forearm pain.  No trauma.  The pain started after the patient had an iron  infusion.  Afebrile and vital signs are stable.  No chest pain or shortness of breath.  Ultrasound of RUE shows "1. No evidence of DVT within the right upper extremity. 2. Superficial  venous thrombosis seen proximal to the medial elbow."  Discussed ultrasound result with Dr. Barbar Bonus, who recommends treating with NSAID and follow up with PCP.  Discussed result with patient via telephone.  Instructed her to take ibuprofen  and follow-up with her PCP tomorrow.  Strict ED precautions discussed.  She agrees to plan of care.  Final Clinical Impressions(s) / UC Diagnoses   Final diagnoses:  Right forearm pain  Superficial venous thrombosis of arm, right     Discharge Instructions      Go to [facility] today at 12:45 pm for the ultrasound of your arm.    Follow up with your primary care provider tomorrow.  Go to the emergency department if you have worsening symptoms.      ED Prescriptions   None    PDMP not reviewed this encounter.   Wellington Half, NP 09/05/23 [phone]

## 2023-09-06 ENCOUNTER — Telehealth: Payer: Self-pay

## 2023-09-06 ENCOUNTER — Encounter: Payer: Self-pay | Admitting: Internal Medicine

## 2023-09-06 ENCOUNTER — Ambulatory Visit (HOSPITAL_COMMUNITY): Payer: Self-pay

## 2023-09-06 NOTE — Telephone Encounter (Signed)
 Copied from CRM [id number]. Topic: Clinical - Medical Advice >> Sep 06, 2023  8:19 AM Dorthula Gavel H wrote: Reason for CRM: Pt states she went to urgent care yesterday for a spot on her arm and it is a blood clot in a superficial vain and she wanted to leave a message for her provider to come up with a action plan for it.

## 2023-09-06 NOTE — Telephone Encounter (Signed)
 Pt notified to just follow urgent care instructions if concerned she will need to make appt with us .

## 2023-09-11 ENCOUNTER — Inpatient Hospital Stay

## 2023-09-12 NOTE — Progress Notes (Signed)
 "  BP 126/78 (BP Location: Left Arm, Patient Position: Sitting, Cuff Size: Normal)   Pulse 85   Temp 98.1 F (36.7 C) (Oral)   Ht 5' 3 (1.6 m)   Wt 192 lb 14.4 oz (87.5 kg)   LMP 08/16/2023   SpO2 98%   BMI 34.17 kg/m    Subjective:    Patient ID: Danielle Maxwell, female    DOB: 04/02/1981, 43 y.o.   MRN: [id number]  HPI: Danielle Maxwell is a 43 y.o. female  Chief Complaint  Patient presents with   superficial blood clot    R arm, noticed it 3 weeks ago. Patient feels like it has gotten bigger and is painful     Discussed the use of AI scribe software for clinical note transcription with the patient, who gave verbal consent to proceed.  History of Present Illness Danielle Maxwell is a 43 year old female who presents with right arm pain following an iron  infusion.  She began experiencing right arm pain the day after receiving an iron  infusion on Monday, Sep 03, 2023. The pain is located on the backside of her right arm and feels like it is spreading. Initially, she thought she might have hit her arm, but the pain persisted throughout the week.  On Wednesday, Sep 05, 2023, she visited urgent care due to ongoing pain and was found to have a superficial clot via ultrasound. Since the urgent care visit, she has been taking ibuprofen  400 mg twice daily, but there has been no improvement in her symptoms.  She has no history of previous blood clots and is currently on birth control. She is scheduled to travel on September 30, 2023, and is concerned about her symptoms persisting during her trip.     EXAM: RIGHT UPPER EXTREMITY VENOUS DOPPLER ULTRASOUND   TECHNIQUE: Gray-scale sonography with graded compression, as well as color Doppler and duplex ultrasound were performed to evaluate the upper extremity deep venous system from the level of the subclavian vein and including the jugular, axillary, basilic, radial, ulnar and upper cephalic vein. Spectral Doppler was utilized to evaluate flow at  rest and with distal augmentation maneuvers.   COMPARISON:  None Available.   FINDINGS: Contralateral Subclavian Vein: Respiratory phasicity is normal and symmetric with the symptomatic side. No evidence of thrombus. Normal compressibility.   Internal Jugular Vein: No evidence of thrombus. Normal compressibility, respiratory phasicity and response to augmentation.   Subclavian Vein: No evidence of thrombus. Normal compressibility, respiratory phasicity and response to augmentation.   Axillary Vein: No evidence of thrombus. Normal compressibility, respiratory phasicity and response to augmentation.   Cephalic Vein: No evidence of thrombus. Normal compressibility, respiratory phasicity and response to augmentation.   Basilic Vein: No evidence of thrombus. Normal compressibility, respiratory phasicity and response to augmentation.   Brachial Veins: No evidence of thrombus. Normal compressibility, respiratory phasicity and response to augmentation.   Radial Veins: No evidence of thrombus. Normal compressibility, respiratory phasicity and response to augmentation.   Ulnar Veins: No evidence of thrombus. Normal compressibility, respiratory phasicity and response to augmentation.   Venous Reflux:  None visualized.   Other Findings: Superficial venous thrombosis seen proximal to the medial elbow.   IMPRESSION: 1. No evidence of DVT within the right upper extremity. 2. Superficial venous thrombosis seen proximal to the medial elbow.      09/13/2023    9:56 AM 08/16/2023   10:45 AM 02/21/2023   11:21 AM  Depression screen PHQ 2/9  Decreased Interest 0 0 0  Down, Depressed, Hopeless 0 0 0  PHQ - 2 Score 0 0 0  Altered sleeping 0 3   Tired, decreased energy 0 1   Change in appetite 0 0   Feeling bad or failure about yourself  0 0   Trouble concentrating 0 0   Moving slowly or fidgety/restless 0 1   Suicidal thoughts 0 0   PHQ-9 Score 0 5   Difficult doing work/chores Not  difficult at all Not difficult at all     Relevant past medical, surgical, family and social history reviewed and updated as indicated. Interim medical history since our last visit reviewed. Allergies and medications reviewed and updated.  Review of Systems Ten systems reviewed and is negative except as mentioned in HPI      Objective:      BP 126/78 (BP Location: Left Arm, Patient Position: Sitting, Cuff Size: Normal)   Pulse 85   Temp 98.1 F (36.7 C) (Oral)   Ht 5' 3 (1.6 m)   Wt 192 lb 14.4 oz (87.5 kg)   LMP 08/16/2023   SpO2 98%   BMI 34.17 kg/m    Wt Readings from Last 3 Encounters:  09/13/23 192 lb 14.4 oz (87.5 kg)  08/16/23 188 lb (85.3 kg)  02/21/23 181 lb (82.1 kg)    Physical Exam Vitals reviewed.  Constitutional:      Appearance: Normal appearance.  HENT:     Head: Normocephalic.  Cardiovascular:     Rate and Rhythm: Normal rate.  Pulmonary:     Effort: Pulmonary effort is normal.  Musculoskeletal:        General: Tenderness present. No swelling.     Comments: Right arm  Skin:    General: Skin is warm.  Neurological:     General: No focal deficit present.     Mental Status: She is alert and oriented to person, place, and time. Mental status is at baseline.  Psychiatric:        Mood and Affect: Mood normal.        Behavior: Behavior normal.        Thought Content: Thought content normal.        Judgment: Judgment normal.        Results for orders placed or performed in visit on 08/16/23  Thyroid  Panel With TSH   Collection Time: 08/16/23 11:34 AM  Result Value Ref Range   T3 Uptake 22 22 - 35 %   T4, Total 9.6 5.1 - 11.9 mcg/dL   Free Thyroxine Index 2.1 1.4 - 3.8   TSH 2.05 mIU/L  COMPLETE METABOLIC PANEL WITHOUT GFR   Collection Time: 08/16/23 11:34 AM  Result Value Ref Range   Glucose, Bld 78 65 - 99 mg/dL   BUN 6 (L) 7 - 25 mg/dL   Creat 9.18 9.49 - 9.00 mg/dL   BUN/Creatinine Ratio 7 6 - 22 (calc)   Sodium 138 135 - 146 mmol/L    Potassium 4.2 3.5 - 5.3 mmol/L   Chloride 103 98 - 110 mmol/L   CO2 25 20 - 32 mmol/L   Calcium 9.3 8.6 - 10.2 mg/dL   Total Protein 7.1 6.1 - 8.1 g/dL   Albumin 4.0 3.6 - 5.1 g/dL   Globulin 3.1 1.9 - 3.7 g/dL (calc)   AG Ratio 1.3 1.0 - 2.5 (calc)   Total Bilirubin 0.3 0.2 - 1.2 mg/dL   Alkaline phosphatase (APISO) 38 31 - 125 U/L  AST 16 10 - 30 U/L   ALT 8 6 - 29 U/L  HgB A1c   Collection Time: 08/16/23 11:34 AM  Result Value Ref Range   Hgb A1c MFr Bld 5.6 <5.7 %   Mean Plasma Glucose 114 mg/dL   eAG (mmol/L) 6.3 mmol/L  Vitamin D  (25 hydroxy)   Collection Time: 08/16/23 11:34 AM  Result Value Ref Range   Vit D, 25-Hydroxy 46 30 - 100 ng/mL          Assessment & Plan:   Problem List Items Addressed This Visit   None Visit Diagnoses       Superficial venous thrombosis of right upper extremity    -  Primary   Relevant Medications   ibuprofen  (ADVIL ) 600 MG tablet        Assessment and Plan Assessment & Plan Superficial thrombophlebitis Superficial thrombophlebitis in the right arm following an iron  infusion, with symptoms persisting for over a week. She reports pain and a sensation of spreading. Currently managed with ibuprofen , but symptoms persist. Discussed the potential influence of birth control on clot risk, though the current clot is attributed to the recent infusion. No prior history of blood clots. Advised that NSAIDs can affect renal and gastrointestinal health, recommending ibuprofen  with food and adequate hydration. - Increase ibuprofen  to 600 mg three times daily with food to minimize gastrointestinal discomfort. - Apply warm compresses to the affected area at least six times daily, not exceeding 20 minutes per session. - Use compression on the arm at home to aid in symptom relief. - Ensure adequate hydration to protect renal function while on NSAIDs. - Schedule follow-up in three weeks to reassess the condition.        Follow up plan: Return  in about 3 weeks (around 10/04/2023) for follow up.      "

## 2023-09-13 ENCOUNTER — Encounter: Payer: Self-pay | Admitting: Nurse Practitioner

## 2023-09-13 ENCOUNTER — Ambulatory Visit: Admitting: Nurse Practitioner

## 2023-09-13 VITALS — BP 126/78 | HR 85 | Temp 98.1°F | Ht 63.0 in | Wt 192.9 lb

## 2023-09-13 DIAGNOSIS — E0590 Thyrotoxicosis, unspecified without thyrotoxic crisis or storm: Secondary | ICD-10-CM | POA: Diagnosis not present

## 2023-09-13 DIAGNOSIS — R748 Abnormal levels of other serum enzymes: Secondary | ICD-10-CM | POA: Diagnosis not present

## 2023-09-13 DIAGNOSIS — I82611 Acute embolism and thrombosis of superficial veins of right upper extremity: Secondary | ICD-10-CM | POA: Diagnosis not present

## 2023-09-13 DIAGNOSIS — E0500 Thyrotoxicosis with diffuse goiter without thyrotoxic crisis or storm: Secondary | ICD-10-CM | POA: Diagnosis not present

## 2023-09-13 MED ORDER — IBUPROFEN 600 MG PO TABS
600.0000 mg | ORAL_TABLET | Freq: Three times a day (TID) | ORAL | 0 refills | Status: DC
Start: 2023-09-13 — End: 2024-02-18

## 2023-09-16 ENCOUNTER — Telehealth

## 2023-09-16 DIAGNOSIS — R3989 Other symptoms and signs involving the genitourinary system: Secondary | ICD-10-CM | POA: Diagnosis not present

## 2023-09-17 MED ORDER — NITROFURANTOIN MONOHYD MACRO 100 MG PO CAPS: 100.0000 mg | ORAL_CAPSULE | Freq: Two times a day (BID) | ORAL | 0 refills | Status: DC

## 2023-09-17 NOTE — Progress Notes (Signed)

## 2023-09-26 ENCOUNTER — Encounter: Payer: Self-pay | Admitting: Internal Medicine

## 2023-09-26 ENCOUNTER — Other Ambulatory Visit: Payer: Self-pay

## 2023-09-26 ENCOUNTER — Ambulatory Visit: Admitting: Internal Medicine

## 2023-09-26 VITALS — BP 122/76 | HR 96 | Temp 98.2°F | Resp 16 | Ht 63.0 in | Wt 193.7 lb

## 2023-09-26 DIAGNOSIS — E0500 Thyrotoxicosis with diffuse goiter without thyrotoxic crisis or storm: Secondary | ICD-10-CM | POA: Diagnosis not present

## 2023-09-26 DIAGNOSIS — G43009 Migraine without aura, not intractable, without status migrainosus: Secondary | ICD-10-CM

## 2023-09-26 DIAGNOSIS — I10 Essential (primary) hypertension: Secondary | ICD-10-CM

## 2023-09-26 DIAGNOSIS — I82611 Acute embolism and thrombosis of superficial veins of right upper extremity: Secondary | ICD-10-CM | POA: Diagnosis not present

## 2023-09-26 NOTE — Progress Notes (Signed)
 Established Patient Office Visit  Subjective    Patient ID: Danielle Maxwell, female    DOB: 18-May-1980  Age: 43 y.o. MRN: [id number]  CC:  Chief Complaint  Patient presents with   Follow-up     3 week recheck    HPI Danielle Maxwell presents to follow up on chronic medical conditions.   Discussed the use of AI scribe software for clinical note transcription with the patient, who gave verbal consent to proceed.  History of Present Illness Danielle Maxwell is a 43 year old female who presents with a superficial blood clot following an iron  infusion.  She developed swelling and bruising in her arm the day after an iron  infusion, leading to a superficial blood clot confirmed by ultrasound at urgent care. The clot remains but is less painful, with ibuprofen  alleviating soreness. She takes metoprolol  12.5 mg for blood pressure, which is well-controlled, and venlafaxine for migraine prevention. Recent lab work shows normal thyroid  and metabolic function, with an A1c of 5.6.   Hypertension: -Medications: restarted Metoprolol  12.5 mg daily at LOV -Patient is compliant with above medications and reports no side effects. -Checking BP at home (average): normal -Denies any SOB, CP, vision changes, LE edema or symptoms of hypotension  Graves' Disease: -Follows with Endocrinology - Dr. Kathyanne Parkers in [place], last seen in May -First diagnosed last year, had been on Methimazole every other day and eventually got off of it, no issues for 1 year but now with symptoms again -Last TSH: 5/25 2.05, free T4 9.6  Hx of Pre-Diabetes:  -Last A1c 5/25 5.6% -Not currently on medications  Hx of Anemia/Iron  Deficiency:  -Following with Hematology  -Going through iron  transfusions - recently had superficial thrombophlebitis but improved after taking Ibuprofen   Restless Leg Syndrome:  -Taking Gabapentin  100 mg bedtime PRN but without relief -Following with Neurology   Migraines:  -currently on Effexor 37.5 mg BID  migraine prevention  -Takes Relpax as an abortive  - taking every other month  -Following with Neurology  Anxiety:  -Currently on Buspar  5 mg BID, Hydroxyzine  10 mg PRN   Health Maintenance: -Blood work dUTD -Mammogram 12/24 Birads-2 -Colon cancer screening: colonoscopy 5/24, repeat in 10 years   Outpatient Encounter Medications as of 09/26/2023  Medication Sig   busPIRone  (BUSPAR ) 10 MG tablet Take 1 tablet (10 mg total) by mouth 2 (two) times daily.   cyanocobalamin  (VITAMIN B12) 1000 MCG/ML injection Inject 1 mL (1,000 mcg total) into the muscle every 30 (thirty) days.   eletriptan (RELPAX) 40 MG tablet Take by mouth.   gabapentin  (NEURONTIN ) 300 MG capsule Take 1 capsule (300 mg total) by mouth at bedtime as needed (Restless leg).   hydrOXYzine  (ATARAX ) 10 MG tablet Take 1 tablet (10 mg total) by mouth 3 (three) times daily as needed for anxiety.   ibuprofen  (ADVIL ) 600 MG tablet Take 1 tablet (600 mg total) by mouth 3 (three) times daily.   metoprolol  succinate (TOPROL -XL) 25 MG 24 hr tablet Take 0.5 tablets (12.5 mg total) by mouth daily.   venlafaxine (EFFEXOR) 37.5 MG tablet Take 37.5 mg by mouth daily.   XULANE 150-35 MCG/24HR transdermal patch Place 1 patch onto the skin every Sunday.    nitrofurantoin , macrocrystal-monohydrate, (MACROBID ) 100 MG capsule Take 1 capsule (100 mg total) by mouth 2 (two) times daily. (Patient not taking: Reported on 09/26/2023)   Syringe/Needle, Disp, (SYRINGE 3CC/25GX1) 25G X 1 3 ML MISC 1 Syringe by Does not apply route every 30 (thirty)  days. (Patient not taking: Reported on 09/26/2023)   No facility-administered encounter medications on file as of 09/26/2023.    Past Medical History:  Diagnosis Date   Abnormal Pap smear 04/17/2000   Depression 04/17/2006   Epigastric pain 08/11/2022   H/O chlamydia infection    H/O rubella    H/O urinary frequency 09/22/2002   History of emergence delirium    History of varicella    Hx: UTI (urinary  tract infection) 04/17/2009   Hypothyroidism    IDA (iron  deficiency anemia)    Migraine    Monilial vaginitis 06/12/2003   NSVD (normal spontaneous vaginal delivery) 12/31/2011   2227   Polyhydramnios in second trimester    Sinus tachycardia 02/17/2022   Yeast infection     Past Surgical History:  Procedure Laterality Date   CHOLECYSTECTOMY  07/17/2019   COLONOSCOPY WITH PROPOFOL  N/A 09/05/2022   Procedure: COLONOSCOPY WITH PROPOFOL ;  Surgeon: Luke Salaam, MD;  Location[facility] ENDOSCOPY;  Service: Gastroenterology;  Laterality: N/A;   ESOPHAGOGASTRODUODENOSCOPY (EGD) WITH PROPOFOL  N/A 09/05/2022   Procedure: ESOPHAGOGASTRODUODENOSCOPY (EGD) WITH PROPOFOL ;  Surgeon: Luke Salaam, MD;  Location: [facility] ENDOSCOPY;  Service: Gastroenterology;  Laterality: N/A;   WISDOM TOOTH EXTRACTION      Family History  Problem Relation Age of Onset   Diabetes Father    Stroke Father    Heart disease Father    Hypertension Maternal Grandmother    Hypertension Maternal Grandfather    Diabetes Maternal Grandfather    Prostate cancer Maternal Grandfather    Diabetes Paternal Grandmother    Hypertension Maternal Aunt    Anesthesia problems Neg Hx     Social History   Socioeconomic History   Marital status: Married    Spouse name: Not on file   Number of children: Not on file   Years of education: Not on file   Highest education level: Bachelor's degree (e.g., BA, AB, BS)  Occupational History   Not on file  Tobacco Use   Smoking status: Never    Passive exposure: Never   Smokeless tobacco: Never  Vaping Use   Vaping status: Never Used  Substance and Sexual Activity   Alcohol use: Yes    Comment: rarely   Drug use: No   Sexual activity: Yes    Partners: Male    Birth control/protection: None  Other Topics Concern   Not on file  Social History Narrative   Married.   2 children.   Works at [employer].   Enjoys reading.    Social Drivers of Corporate investment banker Strain: Low Risk   (08/12/2023)   Overall Financial Resource Strain (CARDIA)    Difficulty of Paying Living Expenses: Not hard at all  Food Insecurity: No Food Insecurity (08/12/2023)   Hunger Vital Sign    Worried About Running Out of Food in the Last Year: Never true    Ran Out of Food in the Last Year: Never true  Transportation Needs: No Transportation Needs (08/12/2023)   PRAPARE - Administrator, Civil Service (Medical): No    Lack of Transportation (Non-Medical): No  Physical Activity: Insufficiently Active (08/12/2023)   Exercise Vital Sign    Days of Exercise per Week: 1 day    Minutes of Exercise per Session: 10 min  Stress: Stress Concern Present (08/12/2023)   [employer] of Occupational Health - Occupational Stress Questionnaire    Feeling of Stress : Very much  Social Connections: Moderately Isolated (08/12/2023)   Social  Connection and Isolation Panel [NHANES]    Frequency of Communication with Friends and Family: More than three times a week    Frequency of Social Gatherings with Friends and Family: Twice a week    Attends Religious Services: Never    Database administrator or Organizations: No    Attends Engineer, structural: Not on file    Marital Status: Married  Intimate Partner Violence: Unknown (08/22/2021)   Received from [employer], [facility]   HITS    Physically Hurt: Not on file    Insult or Talk Down To: Not on file    Threaten Physical Harm: Not on file    Scream or Curse: Not on file    Review of Systems  Constitutional:  Negative for chills and fever.  Cardiovascular:  Negative for palpitations.        Objective    BP 122/76 (Cuff Size: Large)   Pulse 96   Temp 98.2 F (36.8 C) (Oral)   Resp 16   Ht 5' 3 (1.6 m)   Wt 193 lb 11.2 oz (87.9 kg)   SpO2 96%   BMI 34.31 kg/m   Physical Exam Constitutional:      Appearance: Normal appearance.  HENT:     Head: Normocephalic and atraumatic.  Eyes:     Conjunctiva/sclera:  Conjunctivae normal.  Cardiovascular:     Rate and Rhythm: Normal rate and regular rhythm.  Pulmonary:     Effort: Pulmonary effort is normal.     Breath sounds: Normal breath sounds.  Musculoskeletal:     Right lower leg: No edema.     Left lower leg: No edema.  Skin:    General: Skin is warm and dry.  Neurological:     General: No focal deficit present.     Mental Status: She is alert. Mental status is at baseline.  Psychiatric:        Mood and Affect: Mood normal.        Behavior: Behavior normal.         Assessment & Plan:   Assessment & Plan Superficial Thrombophlebitis Superficial thrombophlebitis in the arm post-iron  infusion, improving with ibuprofen . Ultrasound confirmed superficial clot, expected to resolve in 4-6 weeks. No DVT detected. Low risk of complications. - Continue ibuprofen  as needed for pain and inflammation. - Advise on compression stockings during flights to prevent swelling. - Encourage movement during flights to reduce clotting risk.  Hypertension Hypertension well-controlled with metoprolol  12.5 mg. Blood pressure 122/76 mmHg, no side effects. - Continue metoprolol  12.5 mg as prescribed. - Monitor blood pressure regularly.  Graves' Disease Graves' disease well-managed with normal thyroid  function tests. No symptoms, no need for methimazole. - Continue monitoring thyroid  function as needed.  Migraine Migraine management ongoing with venlafaxine prescribed by neurology. - Continue venlafaxine as prescribed by neurology.  General Health Maintenance Overall health well-maintained with normal labs. A1c improved to 5.6, no current diabetes risk. - Continue routine health maintenance and monitoring.   Return in about 5 months (around 02/26/2024).   Rockney Cid, DO

## 2023-10-10 ENCOUNTER — Encounter: Payer: Self-pay | Admitting: Oncology

## 2023-11-26 DIAGNOSIS — H53149 Visual discomfort, unspecified: Secondary | ICD-10-CM | POA: Diagnosis not present

## 2023-11-26 DIAGNOSIS — G43109 Migraine with aura, not intractable, without status migrainosus: Secondary | ICD-10-CM | POA: Diagnosis not present

## 2023-11-26 DIAGNOSIS — F40298 Other specified phobia: Secondary | ICD-10-CM | POA: Diagnosis not present

## 2023-11-26 DIAGNOSIS — R110 Nausea: Secondary | ICD-10-CM | POA: Diagnosis not present

## 2023-12-11 DIAGNOSIS — E0590 Thyrotoxicosis, unspecified without thyrotoxic crisis or storm: Secondary | ICD-10-CM | POA: Diagnosis not present

## 2023-12-11 DIAGNOSIS — E0500 Thyrotoxicosis with diffuse goiter without thyrotoxic crisis or storm: Secondary | ICD-10-CM | POA: Diagnosis not present

## 2024-01-22 ENCOUNTER — Inpatient Hospital Stay: Payer: BC Managed Care – PPO | Admitting: Oncology

## 2024-01-22 ENCOUNTER — Encounter: Payer: Self-pay | Admitting: Oncology

## 2024-01-22 ENCOUNTER — Inpatient Hospital Stay: Payer: BC Managed Care – PPO | Attending: Oncology

## 2024-01-22 VITALS — BP 127/95 | HR 82 | Temp 97.8°F | Resp 18 | Ht 63.0 in | Wt 196.8 lb

## 2024-01-22 DIAGNOSIS — Z8042 Family history of malignant neoplasm of prostate: Secondary | ICD-10-CM | POA: Insufficient documentation

## 2024-01-22 DIAGNOSIS — D509 Iron deficiency anemia, unspecified: Secondary | ICD-10-CM

## 2024-01-22 DIAGNOSIS — D519 Vitamin B12 deficiency anemia, unspecified: Secondary | ICD-10-CM | POA: Insufficient documentation

## 2024-01-22 DIAGNOSIS — E538 Deficiency of other specified B group vitamins: Secondary | ICD-10-CM

## 2024-01-22 LAB — CBC (CANCER CENTER ONLY)
HCT: 40.3 % (ref 36.0–46.0)
Hemoglobin: 13.3 g/dL (ref 12.0–15.0)
MCH: 29.3 pg (ref 26.0–34.0)
MCHC: 33 g/dL (ref 30.0–36.0)
MCV: 88.8 fL (ref 80.0–100.0)
Platelet Count: 313 K/uL (ref 150–400)
RBC: 4.54 MIL/uL (ref 3.87–5.11)
RDW: 12.8 % (ref 11.5–15.5)
WBC Count: 8.4 K/uL (ref 4.0–10.5)
nRBC: 0 % (ref 0.0–0.2)

## 2024-01-22 LAB — FERRITIN: Ferritin: 198 ng/mL (ref 11–307)

## 2024-01-22 LAB — IRON AND TIBC
Iron: 46 ug/dL (ref 28–170)
Saturation Ratios: 13 % (ref 10.4–31.8)
TIBC: 365 ug/dL (ref 250–450)
UIBC: 319 ug/dL

## 2024-01-22 MED ORDER — CYANOCOBALAMIN 1000 MCG/ML IJ SOLN: 1000.0000 ug | INTRAMUSCULAR | 11 refills | Status: AC

## 2024-01-22 NOTE — Progress Notes (Signed)
 Hematology/Oncology Consult note [facility]  [phone] [phone]  Patient Care Team: Bernardo Fend, DO as PCP - General (Internal Medicine) Armond Cape, MD as Consulting Physician (Obstetrics and Gynecology) Faythe Purchase, MD as Consulting Physician (Endocrinology) Melanee Annah BROCKS, MD as Consulting Physician (Oncology)   Name of the patient: Danielle Maxwell  [id number]  31-Oct-1980   Date of visit: 01/22/24  Diagnosis-iron  and B12 deficiency anemia  Chief complaint/ Reason for visit-routine follow-up of anemia  Heme/Onc history: Patient is a 43 year old female with history of iron  and B12 deficiency anemia.  Presently patient is on B12 injections at home.  She has never received any IV iron .  Her iron  levels are chronically low but she is on oral iron  without any significant anemia. She has seen GI in the past and has undergone EGD and colonoscopy in May 2024 which did not show any evidence of bleeding or malignancy.  She is perimenopausal and menstrual cycles are not heavy.    Interval history- Danielle Maxwell is a 43 year old female with iron  and B12 deficiency who presents for follow-up.  She administers B12 injections at home but has run out of her supply, with the last dose taken in August 2025.  No blood in her stool, no dark, tarry stools, and regular menstrual cycles that are not overtly heavy.  ECOG PS- 0 Pain scale- 0   Review of systems- Review of Systems  Constitutional:  Negative for chills, fever, malaise/fatigue and weight loss.  HENT:  Negative for congestion, ear discharge and nosebleeds.   Eyes:  Negative for blurred vision.  Respiratory:  Negative for cough, hemoptysis, sputum production, shortness of breath and wheezing.   Cardiovascular:  Negative for chest pain, palpitations, orthopnea and claudication.  Gastrointestinal:  Negative for abdominal pain, blood in stool, constipation, diarrhea, heartburn,  melena, nausea and vomiting.  Genitourinary:  Negative for dysuria, flank pain, frequency, hematuria and urgency.  Musculoskeletal:  Negative for back pain, joint pain and myalgias.  Skin:  Negative for rash.  Neurological:  Negative for dizziness, tingling, focal weakness, seizures, weakness and headaches.  Endo/Heme/Allergies:  Does not bruise/bleed easily.  Psychiatric/Behavioral:  Negative for depression and suicidal ideas. The patient does not have insomnia.       Allergies  Allergen Reactions   Cymbalta [Duloxetine Hcl]     Headaches      Past Medical History:  Diagnosis Date   Abnormal Pap smear 04/17/2000   Depression 04/17/2006   Epigastric pain 08/11/2022   H/O chlamydia infection    H/O rubella    H/O urinary frequency 09/22/2002   History of emergence delirium    History of varicella    Hx: UTI (urinary tract infection) 04/17/2009   Hypothyroidism    IDA (iron  deficiency anemia)    Migraine    Monilial vaginitis 06/12/2003   NSVD (normal spontaneous vaginal delivery) 12/31/2011   2227   Polyhydramnios in second trimester    Sinus tachycardia 02/17/2022   Yeast infection      Past Surgical History:  Procedure Laterality Date   CHOLECYSTECTOMY  07/17/2019   COLONOSCOPY WITH PROPOFOL  N/A 09/05/2022   Procedure: COLONOSCOPY WITH PROPOFOL ;  Surgeon: Therisa Bi, MD;  Location: [facility] ENDOSCOPY;  Service: Gastroenterology;  Laterality: N/A;   ESOPHAGOGASTRODUODENOSCOPY (EGD) WITH PROPOFOL  N/A 09/05/2022   Procedure: ESOPHAGOGASTRODUODENOSCOPY (EGD) WITH PROPOFOL ;  Surgeon: Therisa Bi, MD;  Location: [facility] ENDOSCOPY;  Service: Gastroenterology;  Laterality: N/A;   WISDOM TOOTH EXTRACTION  Social History   Socioeconomic History   Marital status: Married    Spouse name: Not on file   Number of children: Not on file   Years of education: Not on file   Highest education level: Bachelor's degree (e.g., BA, AB, BS)  Occupational History   Not on file   Tobacco Use   Smoking status: Never    Passive exposure: Never   Smokeless tobacco: Never  Vaping Use   Vaping status: Never Used  Substance and Sexual Activity   Alcohol use: Yes    Comment: rarely   Drug use: No   Sexual activity: Yes    Partners: Male    Birth control/protection: None  Other Topics Concern   Not on file  Social History Narrative   Married.   2 children.   Works at [employer].   Enjoys reading.    Social Drivers of Corporate investment banker Strain: Low Risk  (08/12/2023)   Overall Financial Resource Strain (CARDIA)    Difficulty of Paying Living Expenses: Not hard at all  Food Insecurity: No Food Insecurity (08/12/2023)   Hunger Vital Sign    Worried About Running Out of Food in the Last Year: Never true    Ran Out of Food in the Last Year: Never true  Transportation Needs: No Transportation Needs (08/12/2023)   PRAPARE - Administrator, Civil Service (Medical): No    Lack of Transportation (Non-Medical): No  Physical Activity: Insufficiently Active (08/12/2023)   Exercise Vital Sign    Days of Exercise per Week: 1 day    Minutes of Exercise per Session: 10 min  Stress: Stress Concern Present (08/12/2023)   [employer] of Occupational Health - Occupational Stress Questionnaire    Feeling of Stress : Very much  Social Connections: Moderately Isolated (08/12/2023)   Social Connection and Isolation Panel    Frequency of Communication with Friends and Family: More than three times a week    Frequency of Social Gatherings with Friends and Family: Twice a week    Attends Religious Services: Never    Database administrator or Organizations: No    Attends Engineer, structural: Not on file    Marital Status: Married  Intimate Partner Violence: Unknown (08/22/2021)   Received from [facility]   HITS    Physically Hurt: Not on file    Insult or Talk Down To: Not on file    Threaten Physical Harm: Not on file    Scream or Curse: Not on  file    Family History  Problem Relation Age of Onset   Diabetes Father    Stroke Father    Heart disease Father    Hypertension Maternal Grandmother    Hypertension Maternal Grandfather    Diabetes Maternal Grandfather    Prostate cancer Maternal Grandfather    Diabetes Paternal Grandmother    Hypertension Maternal Aunt    Anesthesia problems Neg Hx      Current Outpatient Medications:    busPIRone  (BUSPAR ) 10 MG tablet, Take 1 tablet (10 mg total) by mouth 2 (two) times daily., Disp: 180 tablet, Rfl: 1   eletriptan (RELPAX) 40 MG tablet, Take by mouth., Disp: , Rfl:    gabapentin  (NEURONTIN ) 300 MG capsule, Take 1 capsule (300 mg total) by mouth at bedtime as needed (Restless leg)., Disp: 90 capsule, Rfl: 0   hydrOXYzine  (ATARAX ) 10 MG tablet, Take 1 tablet (10 mg total) by mouth 3 (three) times daily  as needed for anxiety., Disp: 90 tablet, Rfl: 1   UBRELVY 100 MG TABS, Take by mouth., Disp: , Rfl:    venlafaxine (EFFEXOR) 37.5 MG tablet, Take 37.5 mg by mouth daily., Disp: , Rfl:    XULANE 150-35 MCG/24HR transdermal patch, Place 1 patch onto the skin every Sunday. , Disp: , Rfl:    cyanocobalamin  (VITAMIN B12) 1000 MCG/ML injection, Inject 1 mL (1,000 mcg total) into the muscle every 30 (thirty) days. (Patient not taking: Reported on 01/22/2024), Disp: 1 mL, Rfl: 11   ibuprofen  (ADVIL ) 600 MG tablet, Take 1 tablet (600 mg total) by mouth 3 (three) times daily., Disp: 90 tablet, Rfl: 0   metoprolol  succinate (TOPROL -XL) 25 MG 24 hr tablet, Take 0.5 tablets (12.5 mg total) by mouth daily., Disp: 45 tablet, Rfl: 1  Physical exam:  Vitals:   01/22/24 1521  BP: (!) 127/95  Pulse: 82  Resp: 18  Temp: 97.8 F (36.6 C)  TempSrc: Tympanic  SpO2: 100%  Weight: 196 lb 12.8 oz (89.3 kg)  Height: 5' 3 (1.6 m)   Physical Exam Cardiovascular:     Rate and Rhythm: Normal rate and regular rhythm.     Heart sounds: Normal heart sounds.  Pulmonary:     Effort: Pulmonary effort is  normal.     Breath sounds: Normal breath sounds.  Skin:    General: Skin is warm and dry.  Neurological:     Mental Status: She is alert and oriented to person, place, and time.      I have personally reviewed labs listed below:    Latest Ref Rng & Units 08/16/2023   11:34 AM  CMP  Glucose 65 - 99 mg/dL 78   BUN 7 - 25 mg/dL 6   Creatinine 9.49 - 9.00 mg/dL 9.18   Sodium 864 - 853 mmol/L 138   Potassium 3.5 - 5.3 mmol/L 4.2   Chloride 98 - 110 mmol/L 103   CO2 20 - 32 mmol/L 25   Calcium 8.6 - 10.2 mg/dL 9.3   Total Protein 6.1 - 8.1 g/dL 7.1   Total Bilirubin 0.2 - 1.2 mg/dL 0.3   AST 10 - 30 U/L 16   ALT 6 - 29 U/L 8       Latest Ref Rng & Units 01/22/2024    2:59 PM  CBC  WBC 4.0 - 10.5 K/uL 8.4   Hemoglobin 12.0 - 15.0 g/dL 86.6   Hematocrit 63.9 - 46.0 % 40.3   Platelets 150 - 400 K/uL 313       Assessment and plan- Patient is a 43 y.o. female here for a routine follow-up of iron  B12 deficiency anemia  Iron  deficiency anemia Previously treated with iron  infusions. Hemoglobin stable at 13.3 g/dL. Ferritin levels previously below 50. Current iron  levels pending. - Monitor iron  levels every six months. - Schedule annual follow-up visit. - Administer iron  infusion if ferritin drops below 50.  Vitamin B12 deficiency anemia Managed with home B12 injections. Supplies depleted. - Send prescription for B12 syringes and injections.   Visit Diagnosis 1. Iron  deficiency anemia, unspecified iron  deficiency anemia type   2. B12 deficiency      Dr. Annah Skene, MD, MPH [facility] [phone] 01/22/2024 3:30 PM

## 2024-02-04 ENCOUNTER — Other Ambulatory Visit: Payer: Self-pay | Admitting: Internal Medicine

## 2024-02-04 DIAGNOSIS — F419 Anxiety disorder, unspecified: Secondary | ICD-10-CM

## 2024-02-06 NOTE — Telephone Encounter (Signed)
 Requested Prescriptions  Pending Prescriptions Disp Refills   busPIRone  (BUSPAR ) 10 MG tablet [Pharmacy Med Name: BUSPIRONE  10MG  TABLETS] 180 tablet 0    Sig: TAKE 1 TABLET(10 MG) BY MOUTH TWICE DAILY     Psychiatry: Anxiolytics/Hypnotics - Non-controlled Passed - 02/06/2024  9:55 AM      Passed - Valid encounter within last 12 months    Recent Outpatient Visits           4 months ago Primary hypertension   [facility] Bernardo Fend, DO   4 months ago Superficial venous thrombosis of right upper extremity   [facility] Gareth Mliss FALCON, FNP   5 months ago Primary hypertension   [facility] Bernardo Fend, DO       Future Appointments             In 1 week Bernardo Fend, DO [facility] [place]

## 2024-02-18 ENCOUNTER — Ambulatory Visit: Admitting: Internal Medicine

## 2024-02-18 ENCOUNTER — Other Ambulatory Visit: Payer: Self-pay

## 2024-02-18 VITALS — BP 126/72 | HR 100 | Temp 98.4°F | Resp 16 | Ht 63.0 in | Wt 194.3 lb

## 2024-02-18 DIAGNOSIS — I10 Essential (primary) hypertension: Secondary | ICD-10-CM | POA: Diagnosis not present

## 2024-02-18 DIAGNOSIS — F4323 Adjustment disorder with mixed anxiety and depressed mood: Secondary | ICD-10-CM

## 2024-02-18 DIAGNOSIS — Z23 Encounter for immunization: Secondary | ICD-10-CM | POA: Diagnosis not present

## 2024-02-18 DIAGNOSIS — R7303 Prediabetes: Secondary | ICD-10-CM

## 2024-02-18 DIAGNOSIS — R519 Headache, unspecified: Secondary | ICD-10-CM | POA: Diagnosis not present

## 2024-02-18 DIAGNOSIS — Z13220 Encounter for screening for lipoid disorders: Secondary | ICD-10-CM

## 2024-02-18 MED ORDER — HYDRALAZINE HCL 10 MG PO TABS
10.0000 mg | ORAL_TABLET | Freq: Every day | ORAL | 0 refills | Status: AC | PRN
Start: 2024-02-18 — End: ?

## 2024-02-18 NOTE — Progress Notes (Signed)
 Established Patient Office Visit  Subjective    Patient ID: Danielle Maxwell, female    DOB: Sep 02, 1980  Age: 43 y.o. MRN: [id number]  CC:  Chief Complaint  Patient presents with   Medical Management of Chronic Issues    HPI Danielle Maxwell presents to follow up on chronic medical conditions.   Discussed the use of AI scribe software for clinical note transcription with the patient, who gave verbal consent to proceed.  History of Present Illness  Danielle Maxwell is a 43 year old female who presents for a routine checkup.  She experiences fluctuations in blood pressure, which stabilize with one dose of venlafaxine in the morning and one at night. Blood pressure spikes occur with two doses at night, causing dizziness and lightheadedness. She previously visited the emergency room for high blood pressure readings. Venlafaxine is used for migraine prevention, reducing frequency to about three per month from daily headaches. She adjusted the dosage to manage blood pressure better.  She discontinued metoprolol  after her blood pressure improved with venlafaxine adjustment. Her last thyroid  checkup in August was normal. She takes Buspar  and hydroxyzine  as needed for anxiety, and gabapentin  rarely. Her A1c was 5.6 at the last check, and she is due for a recheck. Iron  levels have recently improved.  Hypertension: -Medications: nothing currently, stopped the Metoprolol  12.5 mg -Patient is compliant with above medications and reports no side effects. -Checking BP at home (average): normal -Denies any SOB, CP, vision changes, LE edema or symptoms of hypotension  Graves' Disease: -Follows with Endocrinology - Dr. Faythe in [place], last seen in May -First diagnosed last year, had been on Methimazole every other day and eventually got off of it, no issues for 1 year but now with symptoms again -Last TSH: 5/25 2.05, free T4 9.6  Hx of Pre-Diabetes:  -Last A1c 5/25 5.6% -Not currently on  medications  Hx of Anemia/Iron  Deficiency:  -Following with Hematology  -Going through iron  transfusions - recently had superficial thrombophlebitis but improved after taking Ibuprofen , labs in October looked good.   Restless Leg Syndrome:  -Taking Gabapentin  100 mg bedtime PRN but without relief -Following with Neurology   Migraines:  -Currently on Effexor 37.5 mg BID migraine prevention - currently having migraines 3 times a month so working well but had to decrease the nighttime dose due to BP fluctuations  -Takes Relpax as an abortive  - taking every other month  -Following with Neurology  Anxiety:  -Currently on Buspar  5 mg BID, Hydroxyzine  10 mg PRN   Health Maintenance: -Blood work due -Mammogram 12/24 Birads-2, following with gyn -Colon cancer screening: colonoscopy 5/24, repeat in 10 years   Outpatient Encounter Medications as of 02/18/2024  Medication Sig   busPIRone  (BUSPAR ) 10 MG tablet TAKE 1 TABLET(10 MG) BY MOUTH TWICE DAILY   cyanocobalamin  (VITAMIN B12) 1000 MCG/ML injection Inject 1 mL (1,000 mcg total) into the muscle every 30 (thirty) days.   eletriptan (RELPAX) 40 MG tablet Take by mouth.   gabapentin  (NEURONTIN ) 300 MG capsule Take 1 capsule (300 mg total) by mouth at bedtime as needed (Restless leg).   hydrOXYzine  (ATARAX ) 10 MG tablet Take 1 tablet (10 mg total) by mouth 3 (three) times daily as needed for anxiety.   UBRELVY 100 MG TABS Take by mouth.   venlafaxine (EFFEXOR) 37.5 MG tablet Take 37.5 mg by mouth daily.   XULANE 150-35 MCG/24HR transdermal patch Place 1 patch onto the skin every Sunday.    ibuprofen  (ADVIL )  600 MG tablet Take 1 tablet (600 mg total) by mouth 3 (three) times daily.   metoprolol  succinate (TOPROL -XL) 25 MG 24 hr tablet Take 0.5 tablets (12.5 mg total) by mouth daily.   No facility-administered encounter medications on file as of 02/18/2024.    Past Medical History:  Diagnosis Date   Abnormal Pap smear 04/17/2000    Depression 04/17/2006   Epigastric pain 08/11/2022   H/O chlamydia infection    H/O rubella    H/O urinary frequency 09/22/2002   History of emergence delirium    History of varicella    Hx: UTI (urinary tract infection) 04/17/2009   Hypothyroidism    IDA (iron  deficiency anemia)    Migraine    Monilial vaginitis 06/12/2003   NSVD (normal spontaneous vaginal delivery) 12/31/2011   2227   Polyhydramnios in second trimester    Sinus tachycardia 02/17/2022   Yeast infection     Past Surgical History:  Procedure Laterality Date   CHOLECYSTECTOMY  07/17/2019   COLONOSCOPY WITH PROPOFOL  N/A 09/05/2022   Procedure: COLONOSCOPY WITH PROPOFOL ;  Surgeon: Therisa Bi, MD;  Location[facility] ENDOSCOPY;  Service: Gastroenterology;  Laterality: N/A;   ESOPHAGOGASTRODUODENOSCOPY (EGD) WITH PROPOFOL  N/A 09/05/2022   Procedure: ESOPHAGOGASTRODUODENOSCOPY (EGD) WITH PROPOFOL ;  Surgeon: Therisa Bi, MD;  Location[facility] END[facility]COPY;  Service: Gastroenterology;  Laterality: N/A;   WISDOM TOOTH EXTRACTION      Family History  Problem Relation Age of Onset   Diabetes Father    Stroke Father    Heart disease Father    Hypertension Maternal Grandmother    Hypertension Maternal Grandfather    Diabetes Maternal Grandfather    Prostate cancer Maternal Grandfather    Diabetes Paternal Grandmother    Hypertension Maternal Aunt    Anesthesia problems Neg Hx     Social History   Socioeconomic History   Marital status: Married    Spouse name: Not on file   Number of children: Not on file   Years of education: Not on file   Highest education level: Bachelor's degree (e.g., BA, AB, BS)  Occupational History   Not on file  Tobacco Use   Smoking status: Never    Passive exposure: Never   Smokeless tobacco: Never  Vaping Use   Vaping status: Never Used  Substance and Sexual Activity   Alcohol use: Yes    Comment: rarely   Drug use: No   Sexual activity: Yes    Partners: Male    Birth  control/protection: None  Other Topics Concern   Not on file  Social History Narrative   Married.   2 children.   Works at [employer].   Enjoys reading.    Social Drivers of Corporate Investment Banker Strain: Low Risk  (02/18/2024)   Overall Financial Resource Strain (CARDIA)    Difficulty of Paying Living Expenses: Not very hard  Food Insecurity: No Food Insecurity (02/18/2024)   Hunger Vital Sign    Worried About Running Out of Food in the Last Year: Never true    Ran Out of Food in the Last Year: Never true  Transportation Needs: No Transportation Needs (02/18/2024)   PRAPARE - Administrator, Civil Service (Medical): No    Lack of Transportation (Non-Medical): No  Physical Activity: Inactive (02/18/2024)   Exercise Vital Sign    Days of Exercise per Week: 0 days    Minutes of Exercise per Session: Not on file  Stress: Stress Concern Present (02/18/2024)   [employer]ational [employer] - Occupational Stress Questionnaire    Feeling of Stress: Very much  Social Connections: Moderately Isolated (02/18/2024)   Social Connection and Isolation Panel    Frequency of Communication with Friends and Family: More than three times a week    Frequency of Social Gatherings with Friends and Family: More than three times a week    Attends Religious Services: Never    Database Administrator or Organizations: No    Attends Engineer, Structural: Not on file    Marital Status: Married  Intimate Partner Violence: Unknown (08/22/2021)   Received from [facility]   HITS    Physically Hurt: Not on file    Insult or Talk Down To: Not on file    Threaten Physical Harm: Not on file    Scream or Curse: Not on file    Review of Systems  Constitutional:  Negative for chills and fever.  Cardiovascular:  Negative for palpitations.        Objective    BP 126/72 (Cuff Size: Large)   Pulse 100   Temp 98.4 F (36.9 C) (Oral)   Resp 16   Ht 5' 3 (1.6 m)   Wt 194 lb  4.8 oz (88.1 kg)   SpO2 99%   BMI 34.42 kg/m   Physical Exam Constitutional:      Appearance: Normal appearance.  HENT:     Head: Normocephalic and atraumatic.  Eyes:     Conjunctiva/sclera: Conjunctivae normal.  Cardiovascular:     Rate and Rhythm: Normal rate and regular rhythm.  Pulmonary:     Effort: Pulmonary effort is normal.     Breath sounds: Normal breath sounds.  Musculoskeletal:     Right lower leg: No edema.     Left lower leg: No edema.  Skin:    General: Skin is warm and dry.  Neurological:     General: No focal deficit present.     Mental Status: She is alert. Mental status is at baseline.  Psychiatric:        Mood and Affect: Mood normal.        Behavior: Behavior normal.         Assessment & Plan:   Assessment & Plan  Adult Wellness Visit Routine visit with well-controlled blood pressure. Discussed venlafaxine's potential for mild blood pressure elevation. No significant thyroid  changes. Routine monitoring of A1c and cholesterol needed. - Order A1c and cholesterol tests. - Review lab results by end of the week.  Essential hypertension Blood pressure generally well-controlled. Venlafaxine may cause mild blood pressure elevation. Discussed as-needed hydralazine for spikes above 150/90 mmHg. Advised lifestyle modifications. ER visits advised if blood pressure exceeds 200/100 mmHg with symptoms. - Prescribe hydralazine 10 mg as needed for blood pressure above 150/90 mmHg. - Discontinue metoprolol . - Advise lifestyle modifications such as walking, hydration, and relaxation techniques.  Prediabetes Previous A1c was 5.6%. Monitoring A1c every six months due to prior higher reading. - Order A1c test.  Migraine Venlafaxine effective in reducing migraine frequency to about three per month.  General Health Maintenance Routine health maintenance up to date. Mammogram due in December. Vaccinations current. - Advise scheduling mammogram in  December.  Follow-up Routine follow-up planned unless blood pressure issues arise. - Schedule follow-up appointment in six months. - Advise to send blood pressure log via MyChart.  - hydrALAZINE (APRESOLINE) 10 MG tablet; Take 1 tablet (10 mg total) by mouth daily as needed (if BP >150/90).  Dispense:  90 tablet; Refill: 0 - Comprehensive Metabolic Panel (CMET) - HgB A1c - Lipid Profile - Flu vaccine trivalent PF, 6mos and older(Flulaval,Afluria,Fluarix,Fluzone)   Return in about 6 months (around 08/17/2024).   Sharyle Fischer, DO

## 2024-02-19 ENCOUNTER — Ambulatory Visit: Payer: Self-pay | Admitting: Internal Medicine

## 2024-02-19 LAB — COMPREHENSIVE METABOLIC PANEL WITH GFR
AG Ratio: 1.3 (calc) (ref 1.0–2.5)
ALT: 7 U/L (ref 6–29)
AST: 12 U/L (ref 10–30)
Albumin: 3.8 g/dL (ref 3.6–5.1)
Alkaline phosphatase (APISO): 32 U/L (ref 31–125)
BUN/Creatinine Ratio: 6 (calc) (ref 6–22)
BUN: 5 mg/dL — ABNORMAL LOW (ref 7–25)
CO2: 23 mmol/L (ref 20–32)
Calcium: 9.1 mg/dL (ref 8.6–10.2)
Chloride: 105 mmol/L (ref 98–110)
Creat: 0.83 mg/dL (ref 0.50–0.99)
Globulin: 3 g/dL (ref 1.9–3.7)
Glucose, Bld: 101 mg/dL — ABNORMAL HIGH (ref 65–99)
Potassium: 3.3 mmol/L — ABNORMAL LOW (ref 3.5–5.3)
Sodium: 138 mmol/L (ref 135–146)
Total Bilirubin: 0.3 mg/dL (ref 0.2–1.2)
Total Protein: 6.8 g/dL (ref 6.1–8.1)
eGFR: 90 mL/min/1.73m2 (ref >=60)

## 2024-02-19 LAB — LIPID PANEL
Cholesterol: 157 mg/dL (ref <200)
HDL: 57 mg/dL (ref >=50)
LDL Cholesterol (Calc): 79 mg/dL
Non-HDL Cholesterol (Calc): 100 mg/dL (ref <130)
Total CHOL/HDL Ratio: 2.8 (calc) (ref <5.0)
Triglycerides: 118 mg/dL (ref <150)

## 2024-02-19 LAB — HEMOGLOBIN A1C
Hgb A1c MFr Bld: 5.6 % (ref <5.7)
Mean Plasma Glucose: 114 mg/dL
eAG (mmol/L): 6.3 mmol/L

## 2024-02-26 ENCOUNTER — Ambulatory Visit: Admitting: Internal Medicine

## 2024-03-18 DIAGNOSIS — E0500 Thyrotoxicosis with diffuse goiter without thyrotoxic crisis or storm: Secondary | ICD-10-CM | POA: Diagnosis not present

## 2024-04-29 ENCOUNTER — Other Ambulatory Visit: Payer: Self-pay | Admitting: Internal Medicine

## 2024-04-29 DIAGNOSIS — F419 Anxiety disorder, unspecified: Secondary | ICD-10-CM

## 2024-04-30 NOTE — Telephone Encounter (Signed)
 Requested Prescriptions  Pending Prescriptions Disp Refills   busPIRone  (BUSPAR ) 10 MG tablet [Pharmacy Med Name: BUSPIRONE  10MG  TABLETS] 180 tablet 1    Sig: TAKE 1 TABLET(10 MG) BY MOUTH TWICE DAILY     Psychiatry: Anxiolytics/Hypnotics - Non-controlled Passed - 04/30/2024 11:52 AM      Passed - Valid encounter within last 12 months    Recent Outpatient Visits           2 months ago Primary hypertension   [facility] Bernardo Fend, DO   7 months ago Primary hypertension   [facility] Bernardo Fend, DO   7 months ago Superficial venous thrombosis of right upper extremity   [facility] Gareth Mliss FALCON, FNP   8 months ago Primary hypertension   [facility] Bernardo Fend, [place]

## 2024-07-22 ENCOUNTER — Other Ambulatory Visit

## 2024-08-19 ENCOUNTER — Ambulatory Visit: Admitting: Internal Medicine

## 2025-01-26 ENCOUNTER — Other Ambulatory Visit

## 2025-01-26 ENCOUNTER — Ambulatory Visit: Admitting: Oncology
